# Patient Record
Sex: Female | Born: 1995 | Race: White | Hispanic: No | Marital: Single | Smoking: Never smoker
Health system: Southern US, Community
[De-identification: ages and names within clinical notes are randomized; demographics above are authoritative.]

## PROBLEM LIST (undated history)

## (undated) DIAGNOSIS — E559 Vitamin D deficiency, unspecified: Secondary | ICD-10-CM

## (undated) DIAGNOSIS — T7840XA Allergy, unspecified, initial encounter: Secondary | ICD-10-CM

## (undated) HISTORY — DX: Allergy, unspecified, initial encounter: T78.40XA

## (undated) HISTORY — DX: Vitamin D deficiency, unspecified: E55.9

---
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
## 2013-01-25 ENCOUNTER — Encounter: Payer: Self-pay | Admitting: Internal Medicine

## 2013-01-26 ENCOUNTER — Encounter: Payer: Self-pay | Admitting: Emergency Medicine

## 2013-01-26 ENCOUNTER — Ambulatory Visit: Payer: BC Managed Care – PPO | Admitting: Emergency Medicine

## 2013-01-26 VITALS — BP 118/60 | HR 84 | Temp 98.0°F | Resp 18 | Wt 167.0 lb

## 2013-01-26 DIAGNOSIS — J029 Acute pharyngitis, unspecified: Secondary | ICD-10-CM

## 2013-01-26 DIAGNOSIS — R5381 Other malaise: Secondary | ICD-10-CM

## 2013-01-26 DIAGNOSIS — R221 Localized swelling, mass and lump, neck: Secondary | ICD-10-CM

## 2013-01-26 DIAGNOSIS — J309 Allergic rhinitis, unspecified: Secondary | ICD-10-CM | POA: Insufficient documentation

## 2013-01-26 LAB — CBC WITH DIFFERENTIAL/PLATELET
Basophils Absolute: 0 10*3/uL (ref 0.0–0.1)
Basophils Relative: 1 % (ref 0–1)
Eosinophils Absolute: 0.2 10*3/uL (ref 0.0–0.7)
Eosinophils Relative: 2 % (ref 0–5)
HCT: 40 % (ref 36.0–46.0)
Hemoglobin: 13.7 g/dL (ref 12.0–15.0)
Lymphocytes Relative: 35 % (ref 12–46)
Lymphs Abs: 2.7 10*3/uL (ref 0.7–4.0)
MCH: 29.8 pg (ref 26.0–34.0)
MCHC: 34.3 g/dL (ref 30.0–36.0)
MCV: 87.1 fL (ref 78.0–100.0)
Monocytes Absolute: 0.7 10*3/uL (ref 0.1–1.0)
Monocytes Relative: 10 % (ref 3–12)
Neutro Abs: 4 10*3/uL (ref 1.7–7.7)
Neutrophils Relative %: 52 % (ref 43–77)
Platelets: 262 10*3/uL (ref 150–400)
RBC: 4.59 MIL/uL (ref 3.87–5.11)
RDW: 14.3 % (ref 11.5–15.5)
WBC: 7.6 10*3/uL (ref 4.0–10.5)

## 2013-01-26 LAB — BASIC METABOLIC PANEL WITH GFR
BUN: 13 mg/dL (ref 6–23)
CO2: 27 mEq/L (ref 19–32)
Calcium: 9.6 mg/dL (ref 8.4–10.5)
Chloride: 102 mEq/L (ref 96–112)
Creat: 0.79 mg/dL (ref 0.50–1.10)
GFR, Est African American: >89 mL/min
GFR, Est Non African American: >89 mL/min
Glucose, Bld: 91 mg/dL (ref 70–99)
Potassium: 4.2 mEq/L (ref 3.5–5.3)
Sodium: 138 mEq/L (ref 135–145)

## 2013-01-26 LAB — HEPATIC FUNCTION PANEL
ALT: 14 U/L (ref 0–35)
AST: 20 U/L (ref 0–37)
Albumin: 4.6 g/dL (ref 3.5–5.2)
Alkaline Phosphatase: 45 U/L (ref 39–117)
Bilirubin, Direct: 0.1 mg/dL (ref 0.0–0.3)
Indirect Bilirubin: 0.3 mg/dL (ref 0.0–0.9)
Total Bilirubin: 0.4 mg/dL (ref 0.3–1.2)
Total Protein: 8.1 g/dL (ref 6.0–8.3)

## 2013-01-26 LAB — TSH: TSH: 1.35 u[IU]/mL (ref 0.350–4.500)

## 2013-01-26 MED ORDER — AMOXICILLIN 500 MG PO TABS: 500.0000 mg | ORAL_TABLET | Freq: Three times a day (TID) | ORAL | Status: DC

## 2013-01-26 NOTE — Patient Instructions (Signed)
Infectious Mononucleosis Infectious mononucleosis (mono) is a common germ (viral) infection in children, teenagers, and young adults.  CAUSES  Mono is an infection caused by the Malachi Carl virus. The virus is spread by close personal contact with someone who has the infection. It can be passed by contact with your saliva through things such as kissing or sharing drinking glasses. Sometimes, the infection can be spread from someone who does not appear sick but still spreads the virus (asymptomatic carrier state).  SYMPTOMS  The most common symptoms of Mono are:  Sore throat.  Headache.  Fatigue.  Muscle aches.  Swollen glands.  Fever.  Poor appetite.  Enlarged liver or spleen. The less common symptoms can include:  Rash.  Feeling sick to your stomach (nauseous).  Abdominal pain. DIAGNOSIS  Mono is diagnosed by a blood test.  TREATMENT  Treatment of mono is usually at home. There is no medicine that cures this virus. Sometimes hospital treatment is needed in severe cases. Steroid medicine sometimes is needed if the swelling in the throat causes breathing or swallowing problems.  HOME CARE INSTRUCTIONS   Drink enough fluids to keep your urine clear or pale yellow.  Eat soft foods. Cool foods like popsicles or ice cream can soothe a sore throat.  Only take over-the-counter or prescription medicines for pain, discomfort, or fever as directed by your caregiver. Children under 73 years of age should not take aspirin.  Gargle salt water. This may help relieve your sore throat. Put 1 teaspoon (tsp) of salt in 1 cup of warm water. Sucking on hard candy may also help.  Rest as needed.  Start regular activities gradually after the fever is gone. Be sure to rest when tired.  Avoid strenuous exercise or contact sports until your caregiver says it is okay. The liver and spleen could be seriously injured.  Avoid sharing drinking glasses or kissing until your caregiver tells you  that you are no longer contagious. SEEK MEDICAL CARE IF:   Your fever is not gone after 7 days.  Your activity level is not back to normal after 2 weeks.  You have yellow coloring to eyes and skin (jaundice). SEEK IMMEDIATE MEDICAL CARE IF:   You have severe pain in the abdomen or shoulder.  You have trouble swallowing or drooling.  You have trouble breathing.  You develop a stiff neck.  You develop a severe headache.  You cannot stop throwing up (vomiting).  You have convulsions.  You are confused.  You have trouble with balance.  You develop signs of body fluid loss (dehydration):  Weakness.  Sunken eyes.  Pale skin.  Dry mouth.  Rapid breathing or pulse. MAKE SURE YOU:   Understand these instructions.  Will watch your condition.  Will get help right away if you are not doing well or get worse. Document Released: 02/28/2000 Document Revised: 05/25/2011 Document Reviewed: 12/27/2007 [facility] Patient Information 2014 [place], [place]. Sore Throat A sore throat is a painful, burning, sore, or scratchy feeling of the throat. There may be pain or tenderness when swallowing or talking. You may have other symptoms with a sore throat. These include coughing, sneezing, fever, or a swollen neck. A sore throat is often the first sign of another sickness. These sicknesses may include a cold, flu, strep throat, or an infection called mono. Most sore throats go away without medical treatment.  HOME CARE   Only take medicine as told by your doctor.  Drink enough fluids to keep your pee (urine)  clear or pale yellow.  Rest as needed.  Try using throat sprays, lozenges, or suck on hard candy (if older than 4 years or as told).  Sip warm liquids, such as broth, herbal tea, or warm water with honey. Try sucking on frozen ice pops or drinking cold liquids.  Rinse the mouth (gargle) with salt water. Mix 1 teaspoon salt with 8 ounces of water.  Do not smoke. Avoid being  around others when they are smoking.  Put a humidifier in your bedroom at night to moisten the air. You can also turn on a hot shower and sit in the bathroom for 5 10 minutes. Be sure the bathroom door is closed. GET HELP RIGHT AWAY IF:   You have trouble breathing.  You cannot swallow fluids, soft foods, or your spit (saliva).  You have more puffiness (swelling) in the throat.  Your sore throat does not get better in 7 days.  You feel sick to your stomach (nauseous) and throw up (vomit).  You have a fever or lasting symptoms for more than 2 3 days.  You have a fever and your symptoms suddenly get worse. MAKE SURE YOU:   Understand these instructions.  Will watch your condition.  Will get help right away if you are not doing well or get worse. Document Released: 12/10/2007 Document Revised: 11/25/2011 Document Reviewed: 11/08/2011 [facility] Patient Information 2014 [place], [place].

## 2013-01-27 ENCOUNTER — Telehealth: Payer: Self-pay | Admitting: *Deleted

## 2013-01-27 LAB — EPSTEIN-BARR VIRUS VCA ANTIBODY PANEL
EBV EA IgG: <5 U/mL (ref <9.0)
EBV NA IgG: <3 U/mL (ref <18.0)
EBV VCA IgG: 170 U/mL — ABNORMAL HIGH (ref <18.0)
EBV VCA IgM: <10 U/mL (ref <36.0)

## 2013-01-27 NOTE — Telephone Encounter (Signed)
Message copied by Nicholaus Corolla A on Fri Jan 27, 2013  8:43 AM ------      Message from: [place], [place] R      Created: Fri Jan 27, 2013  6:18 AM       All labs ok except she has recent MONO. So she needs to push fluids no extreme activity or contact sports for 2 weeks. ------

## 2013-01-27 NOTE — Progress Notes (Signed)
  Subjective:    Patient ID: Beth Thomas, female    DOB: 07-31-1995, 17 y.o.   MRN: [id number]  HPI Comments: 17 yo presents for recheck of tonsils was treated 12/22/12 with Zpak/ Prednisone and noticed symptoms with left tonsil improved but right still swollen and tender. + post nasal drainage and fatigue. Denies fever or any other symptoms.     Current Outpatient Prescriptions on File Prior to Visit  Medication Sig Dispense Refill  . Cetirizine HCl (ZYRTEC ALLERGY) 10 MG CAPS Take 10 mg by mouth daily.      . cholecalciferol (VITAMIN D) 1000 UNITS tablet Take 5,000 Units by mouth daily.       No current facility-administered medications on file prior to visit.    Review of patient's allergies indicates no known allergies.  Past Medical History  Diagnosis Date  . Allergy   . Unspecified vitamin D deficiency     Review of Systems  Constitutional: Positive for fatigue.  HENT: Positive for postnasal drip and sore throat.   All other systems reviewed and are negative.       Objective:   Physical Exam  Nursing note and vitals reviewed. Constitutional: She is oriented to person, place, and time. She appears well-developed and well-nourished.  HENT:  Head: Normocephalic and atraumatic.  Right Ear: External ear normal.  Left Ear: External ear normal.  Nose: Nose normal.  Mouth/Throat: Oropharynx is clear and moist.  Right tonsil +2 with mild erythema, left normal   Eyes: Pupils are equal, round, and reactive to light.  Neck: Normal range of motion. Neck supple.  Questionable anterior fullness, no obvious mass or assymetry  Cardiovascular: Normal rate, regular rhythm, normal heart sounds and intact distal pulses.   Pulmonary/Chest: Effort normal and breath sounds normal.  Abdominal: Soft. Bowel sounds are normal. She exhibits no mass. There is no tenderness.  Lymphadenopathy:    She has no cervical adenopathy.  Neurological: She is alert and oriented to person, place, and  time.  Skin: Skin is warm and dry.  Psychiatric: She has a normal mood and affect. Judgment normal.          Assessment & Plan:  Right Tonsil enlargement, Check labs r/o abscess vs mono. Start Amox 500 mg TID #21 with food. Increase fluids, rest, if symptoms increase ER. If neck fullness does not resolve will need U/S pt w/c with results. Patient declines dexamethasone injection/ Rocephin

## 2013-01-31 NOTE — Telephone Encounter (Signed)
Pt aware of labs  

## 2013-02-20 ENCOUNTER — Telehealth: Payer: Self-pay | Admitting: Internal Medicine

## 2013-02-20 ENCOUNTER — Other Ambulatory Visit: Payer: Self-pay | Admitting: Emergency Medicine

## 2013-02-20 DIAGNOSIS — J039 Acute tonsillitis, unspecified: Secondary | ICD-10-CM

## 2013-02-20 NOTE — Telephone Encounter (Signed)
Please advise patient we will call with appointment for ENT once we get her scheduled. Please and Thnaks

## 2013-02-20 NOTE — Telephone Encounter (Signed)
Pt needs referral to ENT, rt tonsils is swollen.  Please advise pt

## 2013-02-28 NOTE — Telephone Encounter (Signed)
Pt went to Dr Narda Bonds, and will have to have there tonsils removed per father

## 2013-09-10 ENCOUNTER — Other Ambulatory Visit: Payer: Self-pay | Admitting: Internal Medicine

## 2014-04-26 ENCOUNTER — Encounter: Payer: Self-pay | Admitting: Physician Assistant

## 2014-04-26 ENCOUNTER — Ambulatory Visit (INDEPENDENT_AMBULATORY_CARE_PROVIDER_SITE_OTHER): Payer: 59 | Admitting: Physician Assistant

## 2014-04-26 VITALS — BP 116/70 | HR 100 | Temp 99.2°F | Resp 18 | Ht 63.0 in | Wt 166.0 lb

## 2014-04-26 DIAGNOSIS — J029 Acute pharyngitis, unspecified: Secondary | ICD-10-CM

## 2014-04-26 LAB — POCT RAPID STREP A (OFFICE): Rapid Strep A Screen: NEGATIVE

## 2014-04-26 MED ORDER — AZITHROMYCIN 250 MG PO TABS: ORAL_TABLET | ORAL | Status: DC

## 2014-04-26 MED ORDER — PREDNISONE 20 MG PO TABS: ORAL_TABLET | ORAL | Status: DC

## 2014-04-26 NOTE — Patient Instructions (Signed)
Please take the prednisone to help decrease inflammation and therefore decrease symptoms. Take it it with food to avoid GI upset. It can cause increased energy but on the other hand it can make it hard to sleep at night so please take it AT NIGHT WITH DINNER, it takes 8-12 hours to start working so it will NOT affect your sleeping if you take it at night with your food!!  If you are diabetic it will increase your sugars so decrease carbs and monitor your sugars closely.     -Make sure you are drinking plenty of fluids to stay hydrated.  -while drinking fluids pinch and hold nose close and swallow, to help open eustachian tubes and drain fluid behind ear drums. -you can do salt water gargles. You can also do 1 TSP liquid Maalox and 1 TSP liquid benadryl- mix/ gargle/ spit  If you are not feeling better in 10-14 days, then please call the office.  Pharyngitis Pharyngitis is redness, pain, and swelling (inflammation) of your pharynx.  CAUSES  Pharyngitis is usually caused by infection. Most of the time, these infections are from viruses (viral) and are part of a cold. However, sometimes pharyngitis is caused by bacteria (bacterial). Pharyngitis can also be caused by allergies. Viral pharyngitis may be spread from person to person by coughing, sneezing, and personal items or utensils (cups, forks, spoons, toothbrushes). Bacterial pharyngitis may be spread from person to person by more intimate contact, such as kissing.  SIGNS AND SYMPTOMS  Symptoms of pharyngitis include:   Sore throat.   Tiredness (fatigue).   Low-grade fever.   Headache.  Joint pain and muscle aches.  Skin rashes.  Swollen lymph nodes.  Plaque-like film on throat or tonsils (often seen with bacterial pharyngitis). DIAGNOSIS  Your health care provider will ask you questions about your illness and your symptoms. Your medical history, along with a physical exam, is often all that is needed to diagnose  pharyngitis. Sometimes, a rapid strep test is done. Other lab tests may also be done, depending on the suspected cause.  TREATMENT  Viral pharyngitis will usually get better in 3-4 days without the use of medicine. Bacterial pharyngitis is treated with medicines that kill germs (antibiotics).  HOME CARE INSTRUCTIONS   Drink enough water and fluids to keep your urine clear or pale yellow.   Only take over-the-counter or prescription medicines as directed by your health care provider:   If you are prescribed antibiotics, make sure you finish them even if you start to feel better.   Do not take aspirin.   Get lots of rest.   Gargle with 8 oz of salt water ( tsp of salt per 1 qt of water) as often as every 1-2 hours to soothe your throat.   Throat lozenges (if you are not at risk for choking) or sprays may be used to soothe your throat. SEEK MEDICAL CARE IF:   You have large, tender lumps in your neck.  You have a rash.  You cough up green, yellow-brown, or bloody spit. SEEK IMMEDIATE MEDICAL CARE IF:   Your neck becomes stiff.  You drool or are unable to swallow liquids.  You vomit or are unable to keep medicines or liquids down.  You have severe pain that does not go away with the use of recommended medicines.  You have trouble breathing (not caused by a stuffy nose). MAKE SURE YOU:   Understand these instructions.  Will watch your condition.  Will get help right   away if you are not doing well or get worse. Document Released: 03/02/2005 Document Revised: 12/21/2012 Document Reviewed: 11/07/2012 [employer] Patient Information 2015 [employer]. This information is not intended to replace advice given to you by your health care provider. Make sure you discuss any questions you have with your health care provider.  

## 2014-04-26 NOTE — Progress Notes (Signed)
   Subjective:    Patient ID: Beth Thomas, female    DOB: June 15, 1995, 19 y.o.   MRN: [id number]  HPI 19 y.o. female complaining of sinus symptoms for 1 week and sore throat for 2 days.Symptoms include low grade fever, nasal congestion, post nasal drip and sore throat.  Has been gradually worsening since that time. Treatment to date: mucinex, zyrtec.Has history of yearly strep throat several times in the past and follows with Dr. Ezzard StandingNewman.   Review of Systems  Constitutional: Positive for chills and fatigue. Negative for fever and diaphoresis.  HENT: Positive for congestion, postnasal drip, rhinorrhea, sinus pressure and sore throat. Negative for drooling, ear pain and sneezing.   Respiratory: Negative.  Negative for cough and shortness of breath.   Cardiovascular: Negative.   Musculoskeletal: Positive for neck pain.  Neurological: Negative.  Negative for headaches.       Objective:   Physical Exam  Constitutional: She appears well-developed and well-nourished.  HENT:  Head: Normocephalic and atraumatic.  Right Ear: External ear normal.  Left Ear: External ear normal.  Mouth/Throat: Uvula is midline and mucous membranes are normal. Oropharyngeal exudate, posterior oropharyngeal edema and posterior oropharyngeal erythema present. No tonsillar abscesses.  Eyes: Conjunctivae and EOM are normal. Pupils are equal, round, and reactive to light.  Neck: Normal range of motion. Neck supple.  Cardiovascular: Normal rate, regular rhythm and normal heart sounds.   Pulmonary/Chest: Effort normal and breath sounds normal.  Abdominal: Soft. Bowel sounds are normal.  Lymphadenopathy:    She has cervical adenopathy (on right).  Skin: Skin is warm and dry.       Assessment & Plan:  Pharyngitis: Strep a negative- take medications as prescribed, increase fluids,  Salt water gargles. If symptoms do not improve in 5-7 days or get worse contact the office or go to ER.

## 2014-05-03 ENCOUNTER — Ambulatory Visit (INDEPENDENT_AMBULATORY_CARE_PROVIDER_SITE_OTHER): Payer: 59 | Admitting: Physician Assistant

## 2014-05-03 ENCOUNTER — Encounter: Payer: Self-pay | Admitting: Physician Assistant

## 2014-05-03 VITALS — BP 118/66 | HR 76 | Temp 98.2°F | Resp 18 | Ht 63.0 in | Wt 166.0 lb

## 2014-05-03 DIAGNOSIS — R112 Nausea with vomiting, unspecified: Secondary | ICD-10-CM

## 2014-05-03 DIAGNOSIS — R5383 Other fatigue: Secondary | ICD-10-CM

## 2014-05-03 DIAGNOSIS — R1030 Lower abdominal pain, unspecified: Secondary | ICD-10-CM

## 2014-05-03 DIAGNOSIS — R5081 Fever presenting with conditions classified elsewhere: Secondary | ICD-10-CM

## 2014-05-03 LAB — BASIC METABOLIC PANEL WITH GFR
BUN: 12 mg/dL (ref 6–23)
CO2: 29 mEq/L (ref 19–32)
Calcium: 9.1 mg/dL (ref 8.4–10.5)
Chloride: 101 mEq/L (ref 96–112)
Creat: 0.77 mg/dL (ref 0.50–1.10)
GFR, Est African American: >89 mL/min
GFR, Est Non African American: >89 mL/min
Glucose, Bld: 74 mg/dL (ref 70–99)
Potassium: 4.1 mEq/L (ref 3.5–5.3)
Sodium: 139 mEq/L (ref 135–145)

## 2014-05-03 LAB — HEPATIC FUNCTION PANEL
ALT: 12 U/L (ref 0–35)
AST: 19 U/L (ref 0–37)
Albumin: 4.1 g/dL (ref 3.5–5.2)
Alkaline Phosphatase: 41 U/L (ref 39–117)
Bilirubin, Direct: 0.1 mg/dL (ref 0.0–0.3)
Indirect Bilirubin: 0.2 mg/dL (ref 0.2–1.2)
Total Bilirubin: 0.3 mg/dL (ref 0.2–1.2)
Total Protein: 7.5 g/dL (ref 6.0–8.3)

## 2014-05-03 MED ORDER — PROMETHAZINE HCL 25 MG PO TABS: 25.0000 mg | ORAL_TABLET | Freq: Four times a day (QID) | ORAL | Status: DC | PRN

## 2014-05-03 MED ORDER — ONDANSETRON HCL 4 MG PO TABS: 4.0000 mg | ORAL_TABLET | Freq: Three times a day (TID) | ORAL | Status: DC | PRN

## 2014-05-03 NOTE — Progress Notes (Signed)
   Subjective:    Patient ID: Beth Thomas, female    DOB: 1995/11/19, 19 y.o.   MRN: [id number]  HPI 19 y.o. female seen for acute pharyngitis on 02/11, given zpak and prednisone (took 6 our of 10), then 4 days later on 02/15 she started to have fever, chills, nausea, vomiting.  She states she did have [place] food Saturday and reheated it Sunday. She has been feeling better since then, last time she vomited was Tuesday night. Since then she has had headache, upset stomach, and achey with fatigue. Not sexually active, denies rashes, diarrhea.    Review of Systems  Constitutional: Positive for fever, chills and fatigue. Negative for diaphoresis, activity change, appetite change and unexpected weight change.  HENT: Positive for sore throat. Negative for congestion, dental problem, drooling, ear discharge, ear pain, facial swelling, hearing loss, mouth sores, nosebleeds, postnasal drip, rhinorrhea, sinus pressure, sneezing, tinnitus, trouble swallowing and voice change.   Respiratory: Negative.   Cardiovascular: Negative.   Gastrointestinal: Positive for nausea, vomiting and abdominal pain. Negative for diarrhea, constipation, blood in stool, abdominal distention, anal bleeding and rectal pain.  Genitourinary: Negative.   Musculoskeletal: Positive for myalgias. Negative for back pain, joint swelling, arthralgias, gait problem, neck pain and neck stiffness.  Skin: Negative.  Negative for rash.  Neurological: Positive for headaches. Negative for dizziness, tremors, seizures, syncope, facial asymmetry, speech difficulty, weakness, light-headedness and numbness.       Objective:   Physical Exam  Constitutional: She is oriented to person, place, and time.  HENT:  Head: Normocephalic and atraumatic.  Right Ear: External ear normal.  Left Ear: External ear normal.  Mouth/Throat: Oropharynx is clear and moist. No oropharyngeal exudate.  + swollen erythematous tonsils without exudate  Neck:  Normal range of motion. Neck supple.  Cardiovascular: Normal rate and regular rhythm.   Pulmonary/Chest: Effort normal and breath sounds normal. No respiratory distress. She has no wheezes.  Abdominal: Soft. Bowel sounds are normal. She exhibits no distension and no mass. There is tenderness (bilateral lower quadrant). There is no rebound and no guarding.  Lymphadenopathy:    She has no cervical adenopathy.  Neurological: She is oriented to person, place, and time.  Skin: Skin is warm and dry. No rash noted.      Assessment & Plan:  Nausea/vomting-benign AB- no change pregnancy- recent ? Strep versus mono with fatigue- will check labs including mono, will give zofran/phenergan for nausea, rest, BRAT diet, and fluids.

## 2014-05-03 NOTE — Patient Instructions (Signed)

## 2014-05-04 LAB — CBC WITH DIFFERENTIAL/PLATELET
Basophils Absolute: 0.1 10*3/uL (ref 0.0–0.1)
Basophils Relative: 1 % (ref 0–1)
Eosinophils Absolute: 0.3 10*3/uL (ref 0.0–0.7)
Eosinophils Relative: 4 % (ref 0–5)
HCT: 38.1 % (ref 36.0–46.0)
Hemoglobin: 12.6 g/dL (ref 12.0–15.0)
Lymphocytes Relative: 43 % (ref 12–46)
Lymphs Abs: 3.1 10*3/uL (ref 0.7–4.0)
MCH: 28.6 pg (ref 26.0–34.0)
MCHC: 33.1 g/dL (ref 30.0–36.0)
MCV: 86.6 fL (ref 78.0–100.0)
MPV: 10.9 fL (ref 8.6–12.4)
Monocytes Absolute: 0.7 10*3/uL (ref 0.1–1.0)
Monocytes Relative: 9 % (ref 3–12)
Neutro Abs: 3.1 10*3/uL (ref 1.7–7.7)
Neutrophils Relative %: 43 % (ref 43–77)
Platelets: 299 10*3/uL (ref 150–400)
RBC: 4.4 MIL/uL (ref 3.87–5.11)
RDW: 14.1 % (ref 11.5–15.5)
WBC: 7.3 10*3/uL (ref 4.0–10.5)

## 2014-05-04 LAB — EPSTEIN-BARR VIRUS VCA ANTIBODY PANEL
EBV EA IgG: <5 U/mL (ref <9.0)
EBV NA IgG: <3 U/mL (ref <18.0)
EBV VCA IgG: 122 U/mL — ABNORMAL HIGH (ref <18.0)
EBV VCA IgM: <10 U/mL (ref <36.0)

## 2014-08-01 ENCOUNTER — Emergency Department (HOSPITAL_COMMUNITY): Payer: 59

## 2014-08-01 ENCOUNTER — Emergency Department (HOSPITAL_COMMUNITY)
Admission: EM | Admit: 2014-08-01 | Discharge: 2014-08-01 | Disposition: A | Discharge status: Home / Self Care | Payer: 59 | Attending: Emergency Medicine | Admitting: Emergency Medicine

## 2014-08-01 ENCOUNTER — Encounter (HOSPITAL_COMMUNITY): Payer: Self-pay | Admitting: Emergency Medicine

## 2014-08-01 DIAGNOSIS — Y929 Unspecified place or not applicable: Secondary | ICD-10-CM | POA: Insufficient documentation

## 2014-08-01 DIAGNOSIS — S01111A Laceration without foreign body of right eyelid and periocular area, initial encounter: Secondary | ICD-10-CM | POA: Insufficient documentation

## 2014-08-01 DIAGNOSIS — Y9389 Activity, other specified: Secondary | ICD-10-CM | POA: Insufficient documentation

## 2014-08-01 DIAGNOSIS — Z8639 Personal history of other endocrine, nutritional and metabolic disease: Secondary | ICD-10-CM | POA: Insufficient documentation

## 2014-08-01 DIAGNOSIS — R55 Syncope and collapse: Secondary | ICD-10-CM | POA: Diagnosis not present

## 2014-08-01 DIAGNOSIS — W1830XA Fall on same level, unspecified, initial encounter: Secondary | ICD-10-CM | POA: Insufficient documentation

## 2014-08-01 DIAGNOSIS — Z79899 Other long term (current) drug therapy: Secondary | ICD-10-CM | POA: Insufficient documentation

## 2014-08-01 DIAGNOSIS — IMO0002 Reserved for concepts with insufficient information to code with codable children: Secondary | ICD-10-CM

## 2014-08-01 DIAGNOSIS — R4020 Unspecified coma: Secondary | ICD-10-CM

## 2014-08-01 DIAGNOSIS — Y999 Unspecified external cause status: Secondary | ICD-10-CM | POA: Diagnosis not present

## 2014-08-01 LAB — BASIC METABOLIC PANEL
Anion gap: 11 (ref 5–15)
BUN: 8 mg/dL (ref 6–20)
CO2: 25 mmol/L (ref 22–32)
Calcium: 9.7 mg/dL (ref 8.9–10.3)
Chloride: 103 mmol/L (ref 101–111)
Creatinine, Ser: 0.85 mg/dL (ref 0.44–1.00)
GFR calc Af Amer: >60 mL/min (ref >60)
GFR calc non Af Amer: >60 mL/min (ref >60)
Glucose, Bld: 113 mg/dL — ABNORMAL HIGH (ref 65–99)
Potassium: 3.6 mmol/L (ref 3.5–5.1)
Sodium: 139 mmol/L (ref 135–145)

## 2014-08-01 LAB — CBC WITH DIFFERENTIAL/PLATELET
Basophils Absolute: 0 10*3/uL (ref 0.0–0.1)
Basophils Relative: 0 % (ref 0–1)
Eosinophils Absolute: 0.1 10*3/uL (ref 0.0–0.7)
Eosinophils Relative: 1 % (ref 0–5)
HCT: 41.1 % (ref 36.0–46.0)
Hemoglobin: 13.1 g/dL (ref 12.0–15.0)
Lymphocytes Relative: 24 % (ref 12–46)
Lymphs Abs: 1.9 10*3/uL (ref 0.7–4.0)
MCH: 27.8 pg (ref 26.0–34.0)
MCHC: 31.9 g/dL (ref 30.0–36.0)
MCV: 87.1 fL (ref 78.0–100.0)
Monocytes Absolute: 0.4 10*3/uL (ref 0.1–1.0)
Monocytes Relative: 6 % (ref 3–12)
Neutro Abs: 5.5 10*3/uL (ref 1.7–7.7)
Neutrophils Relative %: 69 % (ref 43–77)
Platelets: 272 10*3/uL (ref 150–400)
RBC: 4.72 MIL/uL (ref 3.87–5.11)
RDW: 14.1 % (ref 11.5–15.5)
WBC: 7.9 10*3/uL (ref 4.0–10.5)

## 2014-08-01 LAB — I-STAT TROPONIN, ED: Troponin i, poc: 0 ng/mL (ref 0.00–0.08)

## 2014-08-01 LAB — CBG MONITORING, ED: Glucose-Capillary: 119 mg/dL — ABNORMAL HIGH (ref 65–99)

## 2014-08-01 LAB — I-STAT BETA HCG BLOOD, ED (MC, WL, AP ONLY): I-stat hCG, quantitative: <5 m[IU]/mL (ref <5)

## 2014-08-01 MED ORDER — BUPIVACAINE HCL 0.25 % IJ SOLN
5.0000 mL | Freq: Once | INTRAMUSCULAR | Status: DC
  Filled 2014-08-01: qty 5

## 2014-08-01 MED ORDER — BUPIVACAINE HCL (PF) 0.25 % IJ SOLN
10.0000 mL | Freq: Once | INTRAMUSCULAR | Status: AC
Start: 2014-08-01 — End: 2014-08-01
  Administered 2014-08-01: 10 mL
  Filled 2014-08-01: qty 10

## 2014-08-01 MED ORDER — IBUPROFEN 800 MG PO TABS
800.0000 mg | ORAL_TABLET | Freq: Once | ORAL | Status: AC
  Administered 2014-08-01: 800 mg via ORAL
  Filled 2014-08-01: qty 1

## 2014-08-01 NOTE — ED Notes (Signed)
CBG is 119. Notified nurse Cicero DuckErika.

## 2014-08-01 NOTE — Discharge Instructions (Signed)
Head Injury Have sutures removed in 5-7 days.  Keep wound clean and dry. You have received a head injury. It does not appear serious at this time. Headaches and vomiting are common following head injury. It should be easy to awaken from sleeping. Sometimes it is necessary for you to stay in the emergency department for a while for observation. Sometimes admission to the hospital may be needed. After injuries such as yours, most problems occur within the first 24 hours, but side effects may occur up to 7-10 days after the injury. It is important for you to carefully monitor your condition and contact your health care provider or seek immediate medical care if there is a change in your condition. WHAT ARE THE TYPES OF HEAD INJURIES? Head injuries can be as minor as a bump. Some head injuries can be more severe. More severe head injuries include:  A jarring injury to the brain (concussion).  A bruise of the brain (contusion). This mean there is bleeding in the brain that can cause swelling.  A cracked skull (skull fracture).  Bleeding in the brain that collects, clots, and forms a bump (hematoma). WHAT CAUSES A HEAD INJURY? A serious head injury is most likely to happen to someone who is in a car wreck and is not wearing a seat belt. Other causes of major head injuries include bicycle or motorcycle accidents, sports injuries, and falls. HOW ARE HEAD INJURIES DIAGNOSED? A complete history of the event leading to the injury and your current symptoms will be helpful in diagnosing head injuries. Many times, pictures of the brain, such as CT or MRI are needed to see the extent of the injury. Often, an overnight hospital stay is necessary for observation.  WHEN SHOULD I SEEK IMMEDIATE MEDICAL CARE?  You should get help right away if:  You have confusion or drowsiness.  You feel sick to your stomach (nauseous) or have continued, forceful vomiting.  You have dizziness or unsteadiness that is getting  worse.  You have severe, continued headaches not relieved by medicine. Only take over-the-counter or prescription medicines for pain, fever, or discomfort as directed by your health care provider.  You do not have normal function of the arms or legs or are unable to walk.  You notice changes in the black spots in the center of the colored part of your eye (pupil).  You have a clear or bloody fluid coming from your nose or ears.  You have a loss of vision. During the next 24 hours after the injury, you must stay with someone who can watch you for the warning signs. This person should contact local emergency services (911 in the [place] if you have seizures, you become unconscious, or you are unable to wake up. HOW CAN I PREVENT A HEAD INJURY IN THE FUTURE? The most important factor for preventing major head injuries is avoiding motor vehicle accidents. To minimize the potential for damage to your head, it is crucial to wear seat belts while riding in motor vehicles. Wearing helmets while bike riding and playing collision sports (like football) is also helpful. Also, avoiding dangerous activities around the house will further help reduce your risk of head injury.  WHEN CAN I RETURN TO NORMAL ACTIVITIES AND ATHLETICS? You should be reevaluated by your health care provider before returning to these activities. If you have any of the following symptoms, you should not return to activities or contact sports until 1 week after the symptoms have stopped:  Persistent headache.  Dizziness or vertigo.  Poor attention and concentration.  Confusion.  Memory problems.  Nausea or vomiting.  Fatigue or tire easily.  Irritability.  Intolerant of bright lights or loud noises.  Anxiety or depression.  Disturbed sleep. MAKE SURE YOU:   Understand these instructions.  Will watch your condition.  Will get help right away if you are not doing well or get worse. Document Released: 03/02/2005  Document Revised: 03/07/2013 Document Reviewed: 11/07/2012 [facility] Patient Information 2015 [place], [place]. This information is not intended to replace advice given to you by your health care provider. Make sure you discuss any questions you have with your health care provider.  Laceration Care, Adult A laceration is a cut or lesion that goes through all layers of the skin and into the tissue just beneath the skin. TREATMENT  Some lacerations may not require closure. Some lacerations may not be able to be closed due to an increased risk of infection. It is important to see your caregiver as soon as possible after an injury to minimize the risk of infection and maximize the opportunity for successful closure. If closure is appropriate, pain medicines may be given, if needed. The wound will be cleaned to help prevent infection. Your caregiver will use stitches (sutures), staples, wound glue (adhesive), or skin adhesive strips to repair the laceration. These tools bring the skin edges together to allow for faster healing and a better cosmetic outcome. However, all wounds will heal with a scar. Once the wound has healed, scarring can be minimized by covering the wound with sunscreen during the day for 1 full year. HOME CARE INSTRUCTIONS  For sutures or staples:  Keep the wound clean and dry.  If you were given a bandage (dressing), you should change it at least once a day. Also, change the dressing if it becomes wet or dirty, or as directed by your caregiver.  Wash the wound with soap and water 2 times a day. Rinse the wound off with water to remove all soap. Pat the wound dry with a clean towel.  After cleaning, apply a thin layer of the antibiotic ointment as recommended by your caregiver. This will help prevent infection and keep the dressing from sticking.  You may shower as usual after the first 24 hours. Do not soak the wound in water until the sutures are removed.  Only take  over-the-counter or prescription medicines for pain, discomfort, or fever as directed by your caregiver.  Get your sutures or staples removed as directed by your caregiver. For skin adhesive strips:  Keep the wound clean and dry.  Do not get the skin adhesive strips wet. You may bathe carefully, using caution to keep the wound dry.  If the wound gets wet, pat it dry with a clean towel.  Skin adhesive strips will fall off on their own. You may trim the strips as the wound heals. Do not remove skin adhesive strips that are still stuck to the wound. They will fall off in time. For wound adhesive:  You may briefly wet your wound in the shower or bath. Do not soak or scrub the wound. Do not swim. Avoid periods of heavy perspiration until the skin adhesive has fallen off on its own. After showering or bathing, gently pat the wound dry with a clean towel.  Do not apply liquid medicine, cream medicine, or ointment medicine to your wound while the skin adhesive is in place. This may loosen the film before your wound is healed.  If a dressing is placed over the wound, be careful not to apply tape directly over the skin adhesive. This may cause the adhesive to be pulled off before the wound is healed.  Avoid prolonged exposure to sunlight or tanning lamps while the skin adhesive is in place. Exposure to ultraviolet light in the first year will darken the scar.  The skin adhesive will usually remain in place for 5 to 10 days, then naturally fall off the skin. Do not pick at the adhesive film. You may need a tetanus shot if:  You cannot remember when you had your last tetanus shot.  You have never had a tetanus shot. If you get a tetanus shot, your arm may swell, get red, and feel warm to the touch. This is common and not a problem. If you need a tetanus shot and you choose not to have one, there is a rare chance of getting tetanus. Sickness from tetanus can be serious. SEEK MEDICAL CARE IF:   You  have redness, swelling, or increasing pain in the wound.  You see a red line that goes away from the wound.  You have yellowish-white fluid (pus) coming from the wound.  You have a fever.  You notice a bad smell coming from the wound or dressing.  Your wound breaks open before or after sutures have been removed.  You notice something coming out of the wound such as wood or glass.  Your wound is on your hand or foot and you cannot move a finger or toe. SEEK IMMEDIATE MEDICAL CARE IF:   Your pain is not controlled with prescribed medicine.  You have severe swelling around the wound causing pain and numbness or a change in color in your arm, hand, leg, or foot.  Your wound splits open and starts bleeding.  You have worsening numbness, weakness, or loss of function of any joint around or beyond the wound.  You develop painful lumps near the wound or on the skin anywhere on your body. MAKE SURE YOU:   Understand these instructions.  Will watch your condition.  Will get help right away if you are not doing well or get worse. Document Released: 03/02/2005 Document Revised: 05/25/2011 Document Reviewed: 08/26/2010 [facility] Patient Information 2015 [place], [place]. This information is not intended to replace advice given to you by your health care provider. Make sure you discuss any questions you have with your health care provider.

## 2014-08-01 NOTE — ED Provider Notes (Signed)
CSN: [id number]     Arrival date & time 08/01/14  1506 History   First MD Initiated Contact with Patient 08/01/14 1554     Chief Complaint  Patient presents with  . Loss of Consciousness     (Consider location/radiation/quality/duration/timing/severity/associated sxs/prior Treatment) The history is provided by the patient and a parent. No language interpreter was used.   Ms. Beth Thomas is a 19 y.o female who presents for loss of consciousness 1 hour prior to arrival.  She states she felt fine when she woke up this morning but did not eat anything all day.  She went to make herself spaghetti when she felt dizzy for a few minutes before blacking out and falling.  She fell and hit her head on the ground. Her father says she was out for a minute or two. He did not see any convulsions.   She was able to regain consciousness and woke up again and was able to ambulate.  LMP was 1 week ago.  This has never happened to her in the past. She denies any recent illness, blurry vision, chest pain, shortness of breath, nausea, vomiting, abdominal pain, or urinary symptoms.   Past Medical History  Diagnosis Date  . Allergy   . Unspecified vitamin D deficiency    History reviewed. No pertinent past surgical history. Family History  Problem Relation Age of Onset  . Hyperlipidemia Mother   . Hypertension Mother   . Diabetes Mother   . Hypertension Father   . Hyperlipidemia Father    History  Substance Use Topics  . Smoking status: Passive Smoke Exposure - Never Smoker  . Smokeless tobacco: Not on file  . Alcohol Use: No   OB History    No data available     Review of Systems  Eyes: Negative for visual disturbance.  Respiratory: Negative for cough.   Genitourinary: Negative for dysuria, frequency and hematuria.  Musculoskeletal: Negative for back pain and neck pain.  Skin: Positive for wound.  Neurological: Positive for syncope and headaches. Negative for weakness.  All other systems reviewed and  are negative.     Allergies  Review of patient's allergies indicates no known allergies.  Home Medications   Prior to Admission medications   Medication Sig Start Date End Date Taking? Authorizing Provider  Cetirizine HCl (ZYRTEC ALLERGY) 10 MG CAPS Take 10 mg by mouth daily.   Yes Historical Provider, MD  polyethylene glycol powder (GLYCOLAX/MIRALAX) powder TAKE 17 GRAMS DAILY 09/10/13  Yes Melissa Smith, PA-C  ondansetron (ZOFRAN) 4 MG tablet Take 1 tablet (4 mg total) by mouth every 8 (eight) hours as needed for nausea or vomiting. Patient not taking: Reported on 08/01/2014 05/03/14   Quentin MullingAmanda Collier, PA-C  promethazine (PHENERGAN) 25 MG tablet Take 1 tablet (25 mg total) by mouth every 6 (six) hours as needed for nausea or vomiting (sleeping). Patient not taking: Reported on 08/01/2014 05/03/14   Quentin MullingAmanda Collier, PA-C   BP 106/80 mmHg  Pulse 83  Temp(Src) 98.2 F (36.8 C) (Oral)  Resp 18  SpO2 96%  LMP 07/25/2014 Physical Exam  Constitutional: She is oriented to person, place, and time. She appears well-developed and well-nourished.  HENT:  Head: Normocephalic and atraumatic.  Eyes: Conjunctivae are normal.  Neck: Normal range of motion. Neck supple.  Cardiovascular: Normal rate, regular rhythm and normal heart sounds.   Pulmonary/Chest: Effort normal and breath sounds normal.  Abdominal: Soft. There is no tenderness.  Musculoskeletal: Normal range of motion.  Neurological: She is  alert and oriented to person, place, and time. She has normal strength. No sensory deficit. GCS eye subscore is 4. GCS verbal subscore is 5. GCS motor subscore is 6.  Cranial nerves III-XII intact.   Skin: Skin is warm and dry.  2cm linear laceration above the lateral right eyebrow.   Nursing note and vitals reviewed.   ED Course  Procedures (including critical care time) Labs Review Labs Reviewed  BASIC METABOLIC PANEL - Abnormal; Notable for the following:    Glucose, Bld 113 (*)    All other  components within normal limits  CBG MONITORING, ED - Abnormal; Notable for the following:    Glucose-Capillary 119 (*)    All other components within normal limits  CBC WITH DIFFERENTIAL/PLATELET  I-STAT TROPOININ, ED  I-STAT BETA HCG BLOOD, ED ([facility], [facility], [facility] ONLY)  LACERATION REPAIR Performed by: Catha Gosselin Authorized by: Catha Gosselin Consent: Verbal consent obtained. Risks and benefits: risks, benefits and alternatives were discussed Consent given by: patient Patient identity confirmed: provided demographic data Prepped and Draped in normal sterile fashion Wound explored Laceration Location: Right eyebrow Laceration Length: 2cm No Foreign Bodies seen or palpated Anesthesia: local infiltration Local anesthetic: marcaine .25% without epinephrine Anesthetic total: 2 ml Irrigation method: syringe Amount of cleaning: standard Skin closure: 5.0 Prolene Number of sutures: 4 Technique: Single interupted Patient tolerance: Patient tolerated the procedure well with no immediate complications.   Imaging Review Dg Chest 2 View  08/01/2014   CLINICAL DATA:  Loss of consciousness today, fell striking head, headache, vomiting prior to loss of consciousness and again at hospital  EXAM: CHEST  2 VIEW  COMPARISON:  None  FINDINGS: Normal heart size, mediastinal contours, and pulmonary vascularity.  Lungs clear.  No pleural effusion or pneumothorax.  Broad based biconvex thoracolumbar scoliosis.  IMPRESSION: No acute abnormalities.   Electronically Signed   By: Ulyses Southward M.D.   On: 08/01/2014 16:08     EKG Interpretation   Date/Time:  Wednesday Aug 01 2014 15:14:18 EDT Ventricular Rate:  87 PR Interval:  130 QRS Duration: 90 QT Interval:  360 QTC Calculation: 433 R Axis:   74 Text Interpretation:  Normal sinus rhythm RSR' or QR pattern in V1  suggests right ventricular conduction delay Borderline ECG No old tracing  to compare Confirmed by MILLER  MD, BRIAN ([id number]) on  08/01/2014 4:57:17 PM      MDM   Final diagnoses:  Loss of consciousness  Laceration   Patient presents for a couple minutes of dizziness before making herself something to eat.  She went to get the pot to cook spaghetti when she says she blacked out and fell.  Father states he heard her hit her head on the ground and was unconscious for 1-2 minutes.  He did not notice any seizure activity.  She was able to get up and walk after the incident and is complaining of a headache now.  Her labs are normal. She was back to baseline a couple of minutes after the incident.  She has no focal neurological deficit.  I do not think she needs imaging. Her EKG is not concerning.  She is eating and drinking in the ED. I sutured her laceration without difficulty. I think this is related to her not eating today. She can follow up with her pcp to have her sutures removed in 5-7 days and patient and parents agree with the plan.      Catha Gosselin, PA-C 08/01/14 1718  Catha Gosselin,  PA-C 08/01/14 1728  Eber HongBrian Miller, MD 08/02/14 1026

## 2014-08-01 NOTE — ED Notes (Signed)
Patient transported to X-ray 

## 2014-08-01 NOTE — ED Provider Notes (Signed)
19 year old female, she presents from home after having a syncopal episode. She has had nothing to eat today, at 2:00 she was trying to make some spaghetti and became lightheaded when she bent over to grab a pot, syncopized and fell striking her right forehead on the ground, had a loss of consciousness for approximately 2 minutes and then regained spontaneous consciousness, she had no seizures, no urinary incontinence and at this time complains only of a headache on the right and feeling generally weak. On exam she has a laceration to the right eyebrow laterally, this is parallel to the brow just above it, she has a normal pupillary exam, cranial nerves III through XII are normal, no hemotympanum, no malocclusion, no battle sign. She has normal strength and sensation in all 4 extremities, normal cardiac and pulmonary exams and a soft nontender abdomen. Her EKG is normal, there is no signs of arrhythmia or Wolff-Parkinson-White, CBG was 120, CXR and labs pending. The patient is otherwise well-appearing, anticipate discharge home. Tolerated oral food and fluid while in the emergency department.  Medical screening examination/treatment/procedure(s) were conducted as a shared visit with non-physician practitioner(s) and myself.  I personally evaluated the patient during the encounter.  Procedures supervised: Laceration repair.   EKG Interpretation  Date/Time:  Wednesday Aug 01 2014 15:14:18 EDT Ventricular Rate:  87 PR Interval:  130 QRS Duration: 90 QT Interval:  360 QTC Calculation: 433 R Axis:   74 Text Interpretation:  Normal sinus rhythm RSR' or QR pattern in V1 suggests right ventricular conduction delay Borderline ECG No old tracing to compare Confirmed by Izael Bessinger  MD, Yerlin Gasparyan ([id number]) on 08/01/2014 4:57:17 PM      Clinical Impression:   Final diagnoses:  Loss of consciousness  Laceration         Eber HongBrian Kensey Luepke, MD 08/02/14 1026

## 2014-08-01 NOTE — ED Notes (Signed)
I gave the patient a cup of ice, a sprite, 2 packs of crackers and 2 packs of graham crackers per Dr. Hyacinth MeekerMiller.

## 2014-08-01 NOTE — ED Notes (Signed)
Pt sts syncope today with fall; pt with laceration to right side of head; pt alert and appropriate at present; pt sts dizziness prior to episode

## 2014-08-06 ENCOUNTER — Encounter: Payer: Self-pay | Admitting: Physician Assistant

## 2014-08-06 ENCOUNTER — Ambulatory Visit (INDEPENDENT_AMBULATORY_CARE_PROVIDER_SITE_OTHER): Payer: 59 | Admitting: Physician Assistant

## 2014-08-06 VITALS — BP 112/68 | HR 84 | Temp 97.7°F | Resp 16 | Ht 63.0 in | Wt 150.0 lb

## 2014-08-06 DIAGNOSIS — T671XXD Heat syncope, subsequent encounter: Secondary | ICD-10-CM

## 2014-08-06 DIAGNOSIS — Z4802 Encounter for removal of sutures: Secondary | ICD-10-CM

## 2014-08-06 DIAGNOSIS — J351 Hypertrophy of tonsils: Secondary | ICD-10-CM

## 2014-08-06 NOTE — Patient Instructions (Signed)

## 2014-08-06 NOTE — Progress Notes (Signed)
Assessment and Plan: Syncope likely due to hypoglycemia/vasovagal- monitor, labs normal Suture removal- removed 4 suture, healing well, no signs of infection, tolerated well Enlarged tonsils- would benefit from removal- planning on surgery.   HPI 19 y.o.female presents for follow up from the hospital. Patient went to the ER for syncopal episode on  08/01/14. She had a normal EKG, normal labs and was sent home.   She states that had not eaten for 12-15 hours, took a shower and felt some nausea, had just made spaghetti when she felt the room spinning and felt nausea and fell on the way to the bathroom. Denies urinary incontinence, confusion, CP, SOB. Has never had anything like this before. Had 4 sutures placed on 08/01/2014.   Has enlarged tonsils, has dyspagia, trouble at times with eating solid foods due to enlarged tonsils and has had repeated infections.   Images while in the hospital: Dg Chest 2 View  08/01/2014   CLINICAL DATA:  Loss of consciousness today, fell striking head, headache, vomiting prior to loss of consciousness and again at hospital  EXAM: CHEST  2 VIEW  COMPARISON:  None  FINDINGS: Normal heart size, mediastinal contours, and pulmonary vascularity.  Lungs clear.  No pleural effusion or pneumothorax.  Broad based biconvex thoracolumbar scoliosis.  IMPRESSION: No acute abnormalities.   Electronically Signed   By: Ulyses SouthwardMark  Boles M.D.   On: 08/01/2014 16:08    Past Medical History  Diagnosis Date  . Allergy   . Unspecified vitamin D deficiency      No Known Allergies    Current Outpatient Prescriptions on File Prior to Visit  Medication Sig Dispense Refill  . Cetirizine HCl (ZYRTEC ALLERGY) 10 MG CAPS Take 10 mg by mouth daily.    . polyethylene glycol powder (GLYCOLAX/MIRALAX) powder TAKE 17 GRAMS DAILY 527 g 3   No current facility-administered medications on file prior to visit.    ROS: all negative except above.   Physical Exam: Filed Weights   08/06/14 1638   Weight: 150 lb (68.04 kg)   BP 112/68 mmHg  Pulse 84  Temp(Src) 97.7 F (36.5 C)  Resp 16  Ht 5\' 3"  (1.6 m)  Wt 150 lb (68.04 kg)  BMI 26.58 kg/m2  LMP 07/25/2014 General Appearance: Well nourished, in no apparent distress. Eyes: PERRLA, EOMs, conjunctiva no swelling or erythema Sinuses: No Frontal/maxillary tenderness ENT/Mouth: Ext aud canals clear, TMs without erythema, bulging. No erythema, swelling, or exudate on post pharynx.  Tonsils are large without erythema. Hearing normal.  Neck: Supple, thyroid normal.  Respiratory: Respiratory effort normal, BS equal bilaterally without rales, rhonchi, wheezing or stridor.  Cardio: RRR with no MRGs. Brisk peripheral pulses without edema.  Abdomen: Soft, + BS.  Non tender, no guarding, rebound, hernias, masses. Lymphatics: Non tender without lymphadenopathy.  Musculoskeletal: Full ROM, 5/5 strength, normal gait.  Skin: Well healing laceration right lateral eyebrow, mild healing ecchymosis right eye. Warm, dry without rashes, lesions.  Neuro: Cranial nerves intact. Normal muscle tone, no cerebellar symptoms. Sensation intact.  Psych: Awake and oriented X 3, normal affect, Insight and Judgment appropriate.     Quentin Mullingollier, Kia Stavros, PA-C 4:44 PM [facility]

## 2014-09-20 ENCOUNTER — Ambulatory Visit (INDEPENDENT_AMBULATORY_CARE_PROVIDER_SITE_OTHER): Payer: 59 | Admitting: Internal Medicine

## 2014-09-20 ENCOUNTER — Encounter: Payer: Self-pay | Admitting: Physician Assistant

## 2014-09-20 DIAGNOSIS — Z30011 Encounter for initial prescription of contraceptive pills: Secondary | ICD-10-CM

## 2014-09-20 NOTE — Patient Instructions (Signed)
Ethinyl Estradiol; Norethindrone Acetate tablets (contraception) What is this medicine? ETHINYL ESTRADIOL; NORETHINDRONE ACETATE (ETH in il es tra DYE ole; nor eth IN drone AS e tate) is an oral contraceptive. The products combine two types of female hormones, an estrogen and a progestin. They are used to prevent ovulation and pregnancy. This medicine may be used for other purposes; ask your health care provider or pharmacist if you have questions. COMMON BRAND NAME(S): Gildess, Junel 1.5/30, Junel 1/20, LARIN, Loestrin 1.5/30, Loestrin 1/20, Microgestin 1.5/30, Microgestin 1/20 What should I tell my health care provider before I take this medicine? They need to know if you have or ever had any of these conditions: -abnormal vaginal bleeding -blood vessel disease or blood clots -breast, cervical, endometrial, ovarian, liver, or uterine cancer -diabetes -gallbladder disease -heart disease or recent heart attack -high blood pressure -high cholesterol -kidney disease -liver disease -migraine headaches -stroke -systemic lupus erythematosus (SLE) -tobacco smoker -an unusual or allergic reaction to estrogens, progestins, other medicines, foods, dyes, or preservatives -pregnant or trying to get pregnant -breast-feeding How should I use this medicine? Take this medicine by mouth. To reduce nausea, this medicine may be taken with food. Follow the directions on the prescription label. Take this medicine at the same time each day and in the order directed on the package. Do not take your medicine more often than directed. Contact your pediatrician regarding the use of this medicine in children. Special care may be needed. This medicine has been used in female children who have started having menstrual periods. A patient package insert for the product will be given with each prescription and refill. Read this sheet carefully each time. The sheet may change frequently. Overdosage: If you think you  have taken too much of this medicine contact a poison control center or emergency room at once. NOTE: This medicine is only for you. Do not share this medicine with others. What if I miss a dose? If you miss a dose, refer to the patient information sheet you received with your medicine for direction. If you miss more than one pill, this medicine may not be as effective and you may need to use another form of birth control. What may interact with this medicine? -acetaminophen -antibiotics or medicines for infections, especially rifampin, rifabutin, rifapentine, and griseofulvin, and possibly penicillins or tetracyclines -aprepitant -ascorbic acid (vitamin C) -atorvastatin -barbiturate medicines, such as phenobarbital -bosentan -carbamazepine -caffeine -clofibrate -cyclosporine -dantrolene -doxercalciferol -felbamate -grapefruit juice -hydrocortisone -medicines for anxiety or sleeping problems, such as diazepam or temazepam -medicines for diabetes, including pioglitazone -mineral oil -modafinil -mycophenolate -nefazodone -oxcarbazepine -phenytoin -prednisolone -ritonavir or other medicines for HIV infection or AIDS -rosuvastatin -selegiline -soy isoflavones supplements -St. John's wort -tamoxifen or raloxifene -theophylline -thyroid hormones -topiramate -warfarin This list may not describe all possible interactions. Give your health care provider a list of all the medicines, herbs, non-prescription drugs, or dietary supplements you use. Also tell them if you smoke, drink alcohol, or use illegal drugs. Some items may interact with your medicine. What should I watch for while using this medicine? Visit your doctor or health care professional for regular checks on your progress. You will need a regular breast and pelvic exam and Pap smear while on this medicine. Use an additional method of contraception during the first cycle that you take these tablets. If you have any reason  to think you are pregnant, stop taking this medicine right away and contact your doctor or health care professional. If you are taking this   medicine for hormone related problems, it may take several cycles of use to see improvement in your condition. Smoking increases the risk of getting a blood clot or having a stroke while you are taking birth control pills, especially if you are more than 21 years old. You are strongly advised not to smoke. This medicine can make your body retain fluid, making your fingers, hands, or ankles swell. Your blood pressure can go up. Contact your doctor or health care professional if you feel you are retaining fluid. This medicine can make you more sensitive to the sun. Keep out of the sun. If you cannot avoid being in the sun, wear protective clothing and use sunscreen. Do not use sun lamps or tanning beds/booths. If you wear contact lenses and notice visual changes, or if the lenses begin to feel uncomfortable, consult your eye care specialist. In some women, tenderness, swelling, or minor bleeding of the gums may occur. Notify your dentist if this happens. Brushing and flossing your teeth regularly may help limit this. See your dentist regularly and inform your dentist of the medicines you are taking. If you are going to have elective surgery, you may need to stop taking this medicine before the surgery. Consult your health care professional for advice. This medicine does not protect you against HIV infection (AIDS) or any other sexually transmitted diseases. What side effects may I notice from receiving this medicine? Side effects that you should report to your doctor or health care professional as soon as possible: -breast tissue changes or discharge -changes in vaginal bleeding during your period or between your periods -chest pain -coughing up blood -dizziness or fainting spells -headaches or migraines -leg, arm or groin pain -severe or sudden  headaches -stomach pain (severe) -sudden shortness of breath -sudden loss of coordination, especially on one side of the body -speech problems -symptoms of vaginal infection like itching, irritation or unusual discharge -tenderness in the upper abdomen -vomiting -weakness or numbness in the arms or legs, especially on one side of the body -yellowing of the eyes or skin Side effects that usually do not require medical attention (report to your doctor or health care professional if they continue or are bothersome): -breakthrough bleeding and spotting that continues beyond the 3 initial cycles of pills -breast tenderness -mood changes, anxiety, depression, frustration, anger, or emotional outbursts -increased sensitivity to sun or ultraviolet light -nausea -skin rash, acne, or brown spots on the skin -weight gain (slight) This list may not describe all possible side effects. Call your doctor for medical advice about side effects. You may report side effects to FDA at 1-800-FDA-1088. Where should I keep my medicine? Keep out of the reach of children. Store at room temperature between 15 and 30 degrees C (59 and 86 degrees F). Throw away any unused medicine after the expiration date. NOTE: This sheet is a summary. It may not cover all possible information. If you have questions about this medicine, talk to your doctor, pharmacist, or health care provider.  2015, [employer] Standard. (2012-07-08 15:35:20)  

## 2014-09-20 NOTE — Progress Notes (Signed)
   Subjective:    Patient ID: Beth Thomas, female    DOB: 02-24-1996, 19 y.o.   MRN: [id number]  HPI  Patient presents to the office for discussion of birth control.  She recently became sexually active.  She reports that she would like to prevent pregnancy.  She reports that she has never been on any birth control in the past.  She does have normal periods.  She reports that the last period was on the first of last month.  She reports that they have been using condoms, but did have an encounter without a condom.  She does report that her last intercourse was last Thursday.    Review of Systems  Constitutional: Negative for fever, chills and fatigue.  Respiratory: Negative for chest tightness and shortness of breath.   Cardiovascular: Negative for chest pain and palpitations.  Genitourinary: Negative for dysuria, urgency, frequency, hematuria, vaginal bleeding, vaginal discharge and vaginal pain.       Objective:   Physical Exam  Constitutional: She is oriented to person, place, and time. She appears well-developed and well-nourished. No distress.  HENT:  Head: Normocephalic and atraumatic.  Mouth/Throat: Oropharynx is clear and moist. No oropharyngeal exudate.  Eyes: Conjunctivae are normal. No scleral icterus.  Neck: Normal range of motion. Neck supple. No JVD present. No thyromegaly present.  Cardiovascular: Normal rate, regular rhythm, normal heart sounds and intact distal pulses.   Pulmonary/Chest: Effort normal and breath sounds normal. No respiratory distress. She has no wheezes. She has no rales. She exhibits no tenderness.  Abdominal: Soft. Bowel sounds are normal. She exhibits no distension and no mass. There is no tenderness. There is no rebound and no guarding.  Musculoskeletal: Normal range of motion.  Lymphadenopathy:    She has no cervical adenopathy.  Neurological: She is alert and oriented to person, place, and time.  Skin: Skin is warm and dry. She is not  diaphoretic.  Psychiatric: She has a normal mood and affect. Her behavior is normal. Judgment and thought content normal.  Nursing note and vitals reviewed.         Assessment & Plan:    1. Encounter for initial prescription of contraceptive pills -loestrin -period currently 1 week late.  Will check  Pregnancy status -recheck in 1-2 months to determine whether she likes this type of birth control -recommended continued use of condoms for STI protection -Pelvic with pap recommended either this year or next year. - HCG, Quant

## 2014-09-21 LAB — HCG, QUANTITATIVE, PREGNANCY: hCG, Beta Chain, Quant, S: <2 m[IU]/mL

## 2014-10-10 ENCOUNTER — Encounter: Payer: Self-pay | Admitting: Physician Assistant

## 2014-10-10 ENCOUNTER — Ambulatory Visit (INDEPENDENT_AMBULATORY_CARE_PROVIDER_SITE_OTHER): Payer: 59 | Admitting: Physician Assistant

## 2014-10-10 VITALS — BP 122/80 | HR 76 | Temp 98.4°F | Resp 18 | Ht 63.0 in

## 2014-10-10 DIAGNOSIS — Z3049 Encounter for surveillance of other contraceptives: Secondary | ICD-10-CM

## 2014-10-10 DIAGNOSIS — Z23 Encounter for immunization: Secondary | ICD-10-CM

## 2014-10-10 MED ORDER — DROSPIRENONE-ETHINYL ESTRADIOL 3-0.03 MG PO TABS: 1.0000 | ORAL_TABLET | Freq: Every day | ORAL | Status: DC

## 2014-10-10 NOTE — Patient Instructions (Signed)
HPV Vaccine Gardasil (Human Papillomavirus): What You Need to Know 1. What is HPV? Genital human papillomavirus (HPV) is the most common sexually transmitted virus in the [place]. More than half of sexually active men and women are infected with HPV at some time in their lives. About 20 million Americans are currently infected, and about 6 million more get infected each year. HPV is usually spread through sexual contact. Most HPV infections don't cause any symptoms, and go away on their own. But HPV can cause cervical cancer in women. Cervical cancer is the 2nd leading cause of cancer deaths among women around the world. In the [place], about 12,000 women get cervical cancer every year and about 4,000 are expected to die from it. HPV is also associated with several less common cancers, such as vaginal and vulvar cancers in women, and anal and oropharyngeal (back of the throat, including base of tongue and tonsils) cancers in both men and women. HPV can also cause genital warts and warts in the throat. There is no cure for HPV infection, but some of the problems it causes can be treated. 2. HPV vaccine: Why get vaccinated? The HPV vaccine you are getting is one of two vaccines that can be given to prevent HPV. It may be given to both males and females.  This vaccine can prevent most cases of cervical cancer in females, if it is given before exposure to the virus. In addition, it can prevent vaginal and vulvar cancer in females, and genital warts and anal cancer in both males and females. Protection from HPV vaccine is expected to be long-lasting. But vaccination is not a substitute for cervical cancer screening. Women should still get regular Pap tests. 3. Who should get this HPV vaccine and when? HPV vaccine is given as a 3-dose series  1st Dose: Now  2nd Dose: 1 to 2 months after Dose 1  3rd Dose: 6 months after Dose 1 Additional (booster) doses are not recommended. Routine  vaccination  This HPV vaccine is recommended for girls and boys 11 or 21 years of age. It may be given starting at age 9. Why is HPV vaccine recommended at 11 or 21 years of age?  HPV infection is easily acquired, even with only one sex partner. That is why it is important to get HPV vaccine before any sexual contact takes place. Also, response to the vaccine is better at this age than at older ages. Catch-up vaccination This vaccine is recommended for the following people who have not completed the 3-dose series:   Females 13 through 21 years of age.  Males 13 through 21 years of age. This vaccine may be given to men 22 through 21 years of age who have not completed the 3-dose series. It is recommended for men through age 26 who have sex with men or whose immune system is weakened because of HIV infection, other illness, or medications.  HPV vaccine may be given at the same time as other vaccines. 4. Some people should not get HPV vaccine or should wait.  Anyone who has ever had a life-threatening allergic reaction to any component of HPV vaccine, or to a previous dose of HPV vaccine, should not get the vaccine. Tell your doctor if the person getting vaccinated has any severe allergies, including an allergy to yeast.  HPV vaccine is not recommended for pregnant women. However, receiving HPV vaccine when pregnant is not a reason to consider terminating the pregnancy. Women who are breast   feeding may get the vaccine.  People who are mildly ill when a dose of HPV is planned can still be vaccinated. People with a moderate or severe illness should wait until they are better. 5. What are the risks from this vaccine? This HPV vaccine has been used in the [place] and around the world for about six years and has been very safe. However, any medicine could possibly cause a serious problem, such as a severe allergic reaction. The risk of any vaccine causing a serious injury, or death, is extremely  small. Life-threatening allergic reactions from vaccines are very rare. If they do occur, it would be within a few minutes to a few hours after the vaccination. Several mild to moderate problems are known to occur with this HPV vaccine. These do not last long and go away on their own.  Reactions in the arm where the shot was given:  Pain (about 8 people in 10)  Redness or swelling (about 1 person in 4)  Fever:  Mild (100 F) (about 1 person in 10)  Moderate (102 F) (about 1 person in 65)  Other problems:  Headache (about 1 person in 3)  Fainting: Brief fainting spells and related symptoms (such as jerking movements) can happen after any medical procedure, including vaccination. Sitting or lying down for about 15 minutes after a vaccination can help prevent fainting and injuries caused by falls. Tell your doctor if the patient feels dizzy or light-headed, or has vision changes or ringing in the ears.  Like all vaccines, HPV vaccines will continue to be monitored for unusual or severe problems. 6. What if there is a serious reaction? What should I look for?  Look for anything that concerns you, such as signs of a severe allergic reaction, very high fever, or behavior changes. Signs of a severe allergic reaction can include hives, swelling of the face and throat, difficulty breathing, a fast heartbeat, dizziness, and weakness. These would start a few minutes to a few hours after the vaccination.  What should I do?  If you think it is a severe allergic reaction or other emergency that can't wait, call 9-1-1 or get the person to the nearest hospital. Otherwise, call your doctor.  Afterward, the reaction should be reported to the Vaccine Adverse Event Reporting System (VAERS). Your doctor might file this report, or you can do it yourself through the VAERS web site at www.vaers.hhs.gov, or by calling 1-800-822-7967. VAERS is only for reporting reactions. They do not give medical  advice. 7. The National Vaccine Injury Compensation Program  The National Vaccine Injury Compensation Program (VICP) is a federal program that was created to compensate people who may have been injured by certain vaccines.  Persons who believe they may have been injured by a vaccine can learn about the program and about filing a claim by calling 1-800-338-2382 or visiting the VICP website at www.hrsa.gov/vaccinecompensation. 8. How can I learn more?  Ask your doctor.  Call your local or state health department.  Contact the Centers for Disease Control and Prevention (CDC):  Call 1-800-232-4636 (1-800-CDC-INFO)  or  Visit CDC's website at www.cdc.gov/vaccines CDC Human Papillomavirus (HPV) Gardasil (Interim) 07/31/11 Document Released: 12/28/2005 Document Revised: 07/17/2013 Document Reviewed: 04/13/2013 ExitCare Patient Information 2015 [employer]. This information is not intended to replace advice given to you by your health care provider. Make sure you discuss any questions you have with your health care provider.  

## 2014-10-10 NOTE — Progress Notes (Signed)
   Subjective:    Patient ID: Beth Thomas, female    DOB: 08-15-1995, 19 y.o.   MRN: [id number]  HPI 19 y.o. nonsmoking female presents for BCP follow up. She is sexually active x 1 partner, has not had HPV vaccines.  After negative pregnancy test she was put on lo estrin x 1 month, almost done with first pack. She states she has been more emotional, she has had some spotting on it, headaches while on it.   Current Outpatient Prescriptions on File Prior to Visit  Medication Sig Dispense Refill  . Cetirizine HCl (ZYRTEC ALLERGY) 10 MG CAPS Take 10 mg by mouth daily.    . polyethylene glycol powder (GLYCOLAX/MIRALAX) powder TAKE 17 GRAMS DAILY 527 g 3   No current facility-administered medications on file prior to visit.   Past Medical History  Diagnosis Date  . Allergy   . Unspecified vitamin D deficiency      Review of Systems  Constitutional: Negative for fever, chills and fatigue.  Respiratory: Negative.  Negative for chest tightness and shortness of breath.   Cardiovascular: Negative.  Negative for chest pain and palpitations.  Gastrointestinal: Negative.   Genitourinary: Negative for dysuria, urgency, frequency, hematuria, vaginal bleeding, vaginal discharge and vaginal pain.  Musculoskeletal: Negative.   Skin: Negative.   Psychiatric/Behavioral: Positive for dysphoric mood and decreased concentration. Negative for suicidal ideas, hallucinations, behavioral problems, confusion, sleep disturbance, self-injury and agitation. The patient is not nervous/anxious and is not hyperactive.        Objective:   Physical Exam  Constitutional: She is oriented to person, place, and time. She appears well-developed and well-nourished.  HENT:  Head: Normocephalic and atraumatic.  Right Ear: External ear normal.  Left Ear: External ear normal.  Mouth/Throat: Oropharynx is clear and moist.  Eyes: Conjunctivae and EOM are normal. Pupils are equal, round, and reactive to light.  Neck:  Normal range of motion. Neck supple.  Cardiovascular: Normal rate, regular rhythm and normal heart sounds.  Exam reveals no gallop and no friction rub.   No murmur heard. Pulmonary/Chest: Effort normal and breath sounds normal. No respiratory distress. She has no wheezes.  Abdominal: Soft. Bowel sounds are normal. She exhibits no distension. There is no tenderness.  Musculoskeletal: Normal range of motion.  Neurological: She is alert and oriented to person, place, and time.  Skin: Skin is warm and dry.  Psychiatric: She has a normal mood and affect.       Assessment & Plan:  1. Need for HPV vaccination Reschedule 2 months - HPV 9-valent vaccine,Recombinat  2. Encounter for surveillance of other contraceptive Will switch to ocella, if she continues to have issues can discuss nonestrogen BCP

## 2014-10-23 ENCOUNTER — Ambulatory Visit: Payer: Self-pay | Admitting: Physician Assistant

## 2014-11-20 ENCOUNTER — Other Ambulatory Visit: Payer: Self-pay | Admitting: Emergency Medicine

## 2014-12-12 ENCOUNTER — Encounter: Payer: Self-pay | Admitting: Physician Assistant

## 2014-12-12 ENCOUNTER — Ambulatory Visit (INDEPENDENT_AMBULATORY_CARE_PROVIDER_SITE_OTHER): Payer: 59 | Admitting: Physician Assistant

## 2014-12-12 VITALS — BP 110/60 | HR 99 | Temp 97.7°F | Resp 16 | Ht 63.0 in | Wt 141.0 lb

## 2014-12-12 DIAGNOSIS — Z3049 Encounter for surveillance of other contraceptives: Secondary | ICD-10-CM

## 2014-12-12 DIAGNOSIS — J351 Hypertrophy of tonsils: Secondary | ICD-10-CM

## 2014-12-12 DIAGNOSIS — Z23 Encounter for immunization: Secondary | ICD-10-CM

## 2014-12-12 MED ORDER — DROSPIRENONE-ETHINYL ESTRADIOL 3-0.03 MG PO TABS: 1.0000 | ORAL_TABLET | Freq: Every day | ORAL | Status: DC

## 2014-12-12 NOTE — Progress Notes (Signed)
   Subjective:    Patient ID: Beth Thomas, female    DOB: August 25, 1995, 19 y.o.   MRN: [id number]  HPI 19 y.o. WF on BCP.  Getting HPV vaccines, due for second today. She was switched from estrin to ocella, no more spotting, headaches, or labile emotion. She has been having regular periods. Currently on menses now.   Blood pressure 110/60, pulse 99, temperature 97.7 F (36.5 C), temperature source Temporal, resp. rate 16, height  (1.6 m), weight 141 lb (63.957 kg), last menstrual period 12/10/2014, SpO2 97 %.   Review of Systems  Constitutional: Negative for fever, chills and fatigue.  Respiratory: Negative for chest tightness and shortness of breath.   Cardiovascular: Negative for chest pain and palpitations.  Genitourinary: Negative for dysuria, urgency, frequency, hematuria, vaginal bleeding, vaginal discharge and vaginal pain.       Objective:   Physical Exam  Constitutional: She is oriented to person, place, and time. She appears well-developed and well-nourished.  HENT:  Head: Normocephalic and atraumatic.  Right Ear: External ear normal.  Left Ear: External ear normal.  Mouth/Throat: Oropharynx is clear and moist.  Large tonsils.   Eyes: Conjunctivae and EOM are normal. Pupils are equal, round, and reactive to light.  Neck: Normal range of motion. Neck supple.  Cardiovascular: Normal rate, regular rhythm and normal heart sounds.  Exam reveals no gallop and no friction rub.   No murmur heard. Pulmonary/Chest: Effort normal and breath sounds normal. No respiratory distress. She has no wheezes.  Abdominal: Soft. Bowel sounds are normal. She exhibits no distension. There is no tenderness.  Musculoskeletal: Normal range of motion.  Neurological: She is alert and oriented to person, place, and time.  Skin: Skin is warm and dry.  Psychiatric: She has a normal mood and affect.      Assessment & Plan:  BCP- will refill x 1 year, will need PAP age 43, discussed BCP and  ABX/missing doses, discussed safe sex.  HPV vaccine- today and then 4 months.   Schedule CPE and HPV vaccine 4 months out with PAP

## 2014-12-12 NOTE — Patient Instructions (Signed)
Hormonal Contraception Information Estrogen and progesterone (progestin) are hormones used in many forms of birth control (contraception). These two hormones make up most hormonal contraceptives. Hormonal contraceptives use either:   A combination of estrogen hormone and progesterone hormone in one of these forms:  Pill--Pills come in various combinations of active hormone pills and nonhormonal pills. Different combinations of pills may give you a period once a month, once every 3 months, or no period at all. It is important to take the pills the same time each day.  Patch--The patch is placed on the lower abdomen every week for 3 weeks. On the fourth week, the patch is not placed.  Vaginal ring--The ring is placed in the vagina and left there for 3 weeks. It is then removed for 1 week.  Progesterone alone in one of these forms:  Pill--Hormone pills are taken every day of the cycle.  Intrauterine device (IUD)--The IUD is inserted during a menstrual period and removed or replaced every 5 years or sooner.  Implant--Plastic rods are placed under the skin of the upper arm. They are removed or replaced every 3 years or sooner.  Injection--The injection is given once every 90 days. Pregnancy can still occur with any of these hormonal contraceptive methods. If you have any suspicion that you might be pregnant, take a pregnancy test and talk to your health care provider.  ESTROGEN AND PROGESTERONE CONTRACEPTIVES Estrogen and progesterone contraceptives can prevent pregnancy by:  Stopping the release of an egg (ovulation).  Thickening the mucus of the cervix, making it difficult for sperm to enter the uterus.  Changing the lining of the uterus. This change makes it more difficult for an egg to implant. Side effects from estrogen occur more often in the first 2-3 months. Talk to your health care provider about what side effects may affect you. If you develop persistent side effects or they are  severe, talk to your health care provider. PROGESTERONE CONTRACEPTIVES Progesterone-only contraceptives can prevent pregnancy by:   Blocking ovulation. This occurs in many women, but some women will continue to ovulate.   Preventing the entry of sperm into the uterus by keeping the cervical mucus thick and sticky.   Changing the lining of the uterus. This change makes it more difficult for an egg to implant.  Side effects of progesterone can vary. Talk to your health care provider about what side effects may affect you. If you develop persistent side effects or they are severe, talk to your health care provider.  Document Released: 03/22/2007 Document Revised: 11/02/2012 Document Reviewed: 08/14/2012 [facility] Patient Information 2015 [place], [place]. This information is not intended to replace advice given to you by your health care provider. Make sure you discuss any questions you have with your health care provider.   Human Papillomavirus HPV stands for human papillomavirus. There are many different types of HPV. People of all ages and races can get an HPV infection. In females, some types of HPV can cause warts on the sex organs or anus. Some females never see any warts on the outside, yet still have the infection inside. The doctor will need to check the sex organs and do a Pap test to see there is an HPV infection. The only sign of infection may be a Pap test that is abnormal. A female who has HPV has a higher chance of getting cancer of the sex organs or anus. It is very rare, but some females can give their babies the HPV infection when the  baby is being born. Some of these babies can grow warts on the voice box (vocal cords). Males with HPV have a higher chance of getting cancer of the penis or anus. A female may have HPV but not see any warts on his penis or anus. There is no test to find HPV in males, so even if warts are not seen, there may still be an infection. HOME CARE   Take  medicines as told by your doctor.  Get needed Pap tests.  Keep follow-up exams.  Do not touch or scratch the warts.  Do not treat warts with medicines used for treating hand warts.  Tell your sex partner about your infection because he or she may also need treatment.  Do not have sex while you are getting treatment.  After treatment, use condoms during sex.  Use over-the-counter creams for itching as told by your doctor.  Use over-the-counter or prescription medicines for pain, discomfort or fever as told by your doctor.  Do not douche or use tampons during treatment of HPV. GET HELP RIGHT AWAY IF:  You have a temperature by mouth above 102 F (38.9 C), not controlled by medicine. MAKE SURE YOU:   Understand these instructions.  Will watch your condition.  Will get help right away if you are not doing well or get worse. Document Released: 02/13/2008 Document Revised: 05/25/2011 Document Reviewed: 06/07/2013 [facility] Patient Information 2015 [place], [place]. This information is not intended to replace advice given to you by your health care provider. Make sure you discuss any questions you have with your health care provider.

## 2015-03-14 ENCOUNTER — Other Ambulatory Visit: Payer: Self-pay

## 2015-03-14 MED ORDER — POLYETHYLENE GLYCOL 3350 17 GM/SCOOP PO POWD: ORAL | Status: DC

## 2015-04-15 ENCOUNTER — Encounter: Payer: Self-pay | Admitting: Physician Assistant

## 2015-05-14 ENCOUNTER — Ambulatory Visit (INDEPENDENT_AMBULATORY_CARE_PROVIDER_SITE_OTHER): Payer: BLUE CROSS/BLUE SHIELD | Admitting: Physician Assistant

## 2015-05-14 ENCOUNTER — Encounter: Payer: Self-pay | Admitting: Physician Assistant

## 2015-05-14 ENCOUNTER — Other Ambulatory Visit (HOSPITAL_COMMUNITY)
Admission: RE | Admit: 2015-05-14 | Discharge: 2015-05-14 | Disposition: A | Discharge status: Home / Self Care | Payer: BLUE CROSS/BLUE SHIELD | Source: Ambulatory Visit | Attending: Physician Assistant | Admitting: Physician Assistant

## 2015-05-14 VITALS — BP 110/76 | HR 96 | Temp 98.1°F | Resp 16 | Ht 64.5 in | Wt 138.2 lb

## 2015-05-14 DIAGNOSIS — Z01419 Encounter for gynecological examination (general) (routine) without abnormal findings: Secondary | ICD-10-CM | POA: Diagnosis present

## 2015-05-14 DIAGNOSIS — Z0000 Encounter for general adult medical examination without abnormal findings: Secondary | ICD-10-CM

## 2015-05-14 DIAGNOSIS — Z139 Encounter for screening, unspecified: Secondary | ICD-10-CM

## 2015-05-14 DIAGNOSIS — Z0001 Encounter for general adult medical examination with abnormal findings: Secondary | ICD-10-CM | POA: Diagnosis not present

## 2015-05-14 DIAGNOSIS — Z79899 Other long term (current) drug therapy: Secondary | ICD-10-CM

## 2015-05-14 DIAGNOSIS — Z124 Encounter for screening for malignant neoplasm of cervix: Secondary | ICD-10-CM

## 2015-05-14 DIAGNOSIS — Z13220 Encounter for screening for lipoid disorders: Secondary | ICD-10-CM

## 2015-05-14 DIAGNOSIS — E559 Vitamin D deficiency, unspecified: Secondary | ICD-10-CM | POA: Diagnosis not present

## 2015-05-14 DIAGNOSIS — R7309 Other abnormal glucose: Secondary | ICD-10-CM | POA: Insufficient documentation

## 2015-05-14 DIAGNOSIS — D649 Anemia, unspecified: Secondary | ICD-10-CM

## 2015-05-14 DIAGNOSIS — B977 Papillomavirus as the cause of diseases classified elsewhere: Secondary | ICD-10-CM

## 2015-05-14 DIAGNOSIS — IMO0002 Reserved for concepts with insufficient information to code with codable children: Secondary | ICD-10-CM

## 2015-05-14 DIAGNOSIS — R888 Abnormal findings in other body fluids and substances: Secondary | ICD-10-CM | POA: Diagnosis not present

## 2015-05-14 DIAGNOSIS — Z23 Encounter for immunization: Secondary | ICD-10-CM | POA: Diagnosis not present

## 2015-05-14 DIAGNOSIS — Z113 Encounter for screening for infections with a predominantly sexual mode of transmission: Secondary | ICD-10-CM

## 2015-05-14 DIAGNOSIS — R87810 Cervical high risk human papillomavirus (HPV) DNA test positive: Secondary | ICD-10-CM

## 2015-05-14 DIAGNOSIS — J309 Allergic rhinitis, unspecified: Secondary | ICD-10-CM

## 2015-05-14 DIAGNOSIS — Z1151 Encounter for screening for human papillomavirus (HPV): Secondary | ICD-10-CM | POA: Diagnosis not present

## 2015-05-14 DIAGNOSIS — R6889 Other general symptoms and signs: Secondary | ICD-10-CM | POA: Diagnosis not present

## 2015-05-14 DIAGNOSIS — Z1389 Encounter for screening for other disorder: Secondary | ICD-10-CM

## 2015-05-14 LAB — CBC WITH DIFFERENTIAL/PLATELET
Basophils Absolute: 0 10*3/uL (ref 0.0–0.1)
Basophils Relative: 0 % (ref 0–1)
Eosinophils Absolute: 0.1 10*3/uL (ref 0.0–0.7)
Eosinophils Relative: 1 % (ref 0–5)
HCT: 39 % (ref 36.0–46.0)
Hemoglobin: 13.2 g/dL (ref 12.0–15.0)
Lymphocytes Relative: 33 % (ref 12–46)
Lymphs Abs: 2.4 10*3/uL (ref 0.7–4.0)
MCH: 29.5 pg (ref 26.0–34.0)
MCHC: 33.8 g/dL (ref 30.0–36.0)
MCV: 87.1 fL (ref 78.0–100.0)
MPV: 10.8 fL (ref 8.6–12.4)
Monocytes Absolute: 0.5 10*3/uL (ref 0.1–1.0)
Monocytes Relative: 7 % (ref 3–12)
Neutro Abs: 4.2 10*3/uL (ref 1.7–7.7)
Neutrophils Relative %: 59 % (ref 43–77)
Platelets: 272 10*3/uL (ref 150–400)
RBC: 4.48 MIL/uL (ref 3.87–5.11)
RDW: 14.1 % (ref 11.5–15.5)
WBC: 7.2 10*3/uL (ref 4.0–10.5)

## 2015-05-14 NOTE — Patient Instructions (Signed)
Preventive Care for Adults A healthy lifestyle and preventive care can promote health and wellness. Preventive health guidelines for women include the following key practices.  A routine yearly physical is a good way to check with your health care provider about your health and preventive screening. It is a chance to share any concerns and updates on your health and to receive a thorough exam.  Visit your dentist for a routine exam and preventive care every 6 months. Brush your teeth twice a day and floss once a day. Good oral hygiene prevents tooth decay and gum disease.  The frequency of eye exams is based on your age, health, family medical history, use of contact lenses, and other factors. Follow your health care provider's recommendations for frequency of eye exams.  Eat a healthy diet. Foods like vegetables, fruits, whole grains, low-fat dairy products, and lean protein foods contain the nutrients you need without too many calories. Decrease your intake of foods high in solid fats, added sugars, and salt. Eat the right amount of calories for you.Get information about a proper diet from your health care provider, if necessary.  Regular physical exercise is one of the most important things you can do for your health. Most adults should get at least 150 minutes of moderate-intensity exercise (any activity that increases your heart rate and causes you to sweat) each week. In addition, most adults need muscle-strengthening exercises on 2 or more days a week.  Maintain a healthy weight. The body mass index (BMI) is a screening tool to identify possible weight problems. It provides an estimate of body fat based on height and weight. Your health care provider can find your BMI and can help you achieve or maintain a healthy weight.For adults 20 years and older:  A BMI below 18.5 is considered underweight.  A BMI of 18.5 to 24.9 is normal.  A BMI of 25 to 29.9 is considered overweight.  A BMI of  30 and above is considered obese.  Maintain normal blood lipids and cholesterol levels by exercising and minimizing your intake of saturated fat. Eat a balanced diet with plenty of fruit and vegetables. Blood tests for lipids and cholesterol should begin at age 76 and be repeated every 5 years. If your lipid or cholesterol levels are high, you are over 50, or you are at high risk for heart disease, you may need your cholesterol levels checked more frequently.Ongoing high lipid and cholesterol levels should be treated with medicines if diet and exercise are not working.  If you smoke, find out from your health care provider how to quit. If you do not use tobacco, do not start.  Lung cancer screening is recommended for adults aged 22-80 years who are at high risk for developing lung cancer because of a history of smoking. A yearly low-dose CT scan of the lungs is recommended for people who have at least a 30-pack-year history of smoking and are a current smoker or have quit within the past 15 years. A pack year of smoking is smoking an average of 1 pack of cigarettes a day for 1 year (for example: 1 pack a day for 30 years or 2 packs a day for 15 years). Yearly screening should continue until the smoker has stopped smoking for at least 15 years. Yearly screening should be stopped for people who develop a health problem that would prevent them from having lung cancer treatment.  If you are pregnant, do not drink alcohol. If you are breastfeeding,  be very cautious about drinking alcohol. If you are not pregnant and choose to drink alcohol, do not have more than 1 drink per day. One drink is considered to be 12 ounces (355 mL) of beer, 5 ounces (148 mL) of wine, or 1.5 ounces (44 mL) of liquor.  Avoid use of street drugs. Do not share needles with anyone. Ask for help if you need support or instructions about stopping the use of drugs.  High blood pressure causes heart disease and increases the risk of  stroke. Your blood pressure should be checked at least every 1 to 2 years. Ongoing high blood pressure should be treated with medicines if weight loss and exercise do not work.  If you are 75-52 years old, ask your health care provider if you should take aspirin to prevent strokes.  Diabetes screening involves taking a blood sample to check your fasting blood sugar level. This should be done once every 3 years, after age 15, if you are within normal weight and without risk factors for diabetes. Testing should be considered at a younger age or be carried out more frequently if you are overweight and have at least 1 risk factor for diabetes.  Breast cancer screening is essential preventive care for women. You should practice "breast self-awareness." This means understanding the normal appearance and feel of your breasts and may include breast self-examination. Any changes detected, no matter how small, should be reported to a health care provider. Women in their 58s and 30s should have a clinical breast exam (CBE) by a health care provider as part of a regular health exam every 1 to 3 years. After age 16, women should have a CBE every year. Starting at age 53, women should consider having a mammogram (breast X-ray test) every year. Women who have a family history of breast cancer should talk to their health care provider about genetic screening. Women at a high risk of breast cancer should talk to their health care providers about having an MRI and a mammogram every year.  Breast cancer gene (BRCA)-related cancer risk assessment is recommended for women who have family members with BRCA-related cancers. BRCA-related cancers include breast, ovarian, tubal, and peritoneal cancers. Having family members with these cancers may be associated with an increased risk for harmful changes (mutations) in the breast cancer genes BRCA1 and BRCA2. Results of the assessment will determine the need for genetic counseling and  BRCA1 and BRCA2 testing.  Routine pelvic exams to screen for cancer are no longer recommended for nonpregnant women who are considered low risk for cancer of the pelvic organs (ovaries, uterus, and vagina) and who do not have symptoms. Ask your health care provider if a screening pelvic exam is right for you.  If you have had past treatment for cervical cancer or a condition that could lead to cancer, you need Pap tests and screening for cancer for at least 20 years after your treatment. If Pap tests have been discontinued, your risk factors (such as having a new sexual partner) need to be reassessed to determine if screening should be resumed. Some women have medical problems that increase the chance of getting cervical cancer. In these cases, your health care provider may recommend more frequent screening and Pap tests.  The HPV test is an additional test that may be used for cervical cancer screening. The HPV test looks for the virus that can cause the cell changes on the cervix. The cells collected during the Pap test can be  tested for HPV. The HPV test could be used to screen women aged 30 years and older, and should be used in women of any age who have unclear Pap test results. After the age of 30, women should have HPV testing at the same frequency as a Pap test.  Colorectal cancer can be detected and often prevented. Most routine colorectal cancer screening begins at the age of 50 years and continues through age 75 years. However, your health care provider may recommend screening at an earlier age if you have risk factors for colon cancer. On a yearly basis, your health care provider may provide home test kits to check for hidden blood in the stool. Use of a small camera at the end of a tube, to directly examine the colon (sigmoidoscopy or colonoscopy), can detect the earliest forms of colorectal cancer. Talk to your health care provider about this at age 50, when routine screening begins. Direct  exam of the colon should be repeated every 5-10 years through age 75 years, unless early forms of pre-cancerous polyps or small growths are found.  People who are at an increased risk for hepatitis B should be screened for this virus. You are considered at high risk for hepatitis B if:  You were born in a country where hepatitis B occurs often. Talk with your health care provider about which countries are considered high risk.  Your parents were born in a high-risk country and you have not received a shot to protect against hepatitis B (hepatitis B vaccine).  You have HIV or AIDS.  You use needles to inject street drugs.  You live with, or have sex with, someone who has hepatitis B.  You get hemodialysis treatment.  You take certain medicines for conditions like cancer, organ transplantation, and autoimmune conditions.  Hepatitis C blood testing is recommended for all people born from 1945 through 1965 and any individual with known risks for hepatitis C.  Practice safe sex. Use condoms and avoid high-risk sexual practices to reduce the spread of sexually transmitted infections (STIs). STIs include gonorrhea, chlamydia, syphilis, trichomonas, herpes, HPV, and human immunodeficiency virus (HIV). Herpes, HIV, and HPV are viral illnesses that have no cure. They can result in disability, cancer, and death.  You should be screened for sexually transmitted illnesses (STIs) including gonorrhea and chlamydia if:  You are sexually active and are younger than 24 years.  You are older than 24 years and your health care provider tells you that you are at risk for this type of infection.  Your sexual activity has changed since you were last screened and you are at an increased risk for chlamydia or gonorrhea. Ask your health care provider if you are at risk.  If you are at risk of being infected with HIV, it is recommended that you take a prescription medicine daily to prevent HIV infection. This is  called preexposure prophylaxis (PrEP). You are considered at risk if:  You are a heterosexual woman, are sexually active, and are at increased risk for HIV infection.  You take drugs by injection.  You are sexually active with a partner who has HIV.  Talk with your health care provider about whether you are at high risk of being infected with HIV. If you choose to begin PrEP, you should first be tested for HIV. You should then be tested every 3 months for as long as you are taking PrEP.  Osteoporosis is a disease in which the bones lose minerals and strength   with aging. This can result in serious bone fractures or breaks. The risk of osteoporosis can be identified using a bone density scan. Women ages 65 years and over and women at risk for fractures or osteoporosis should discuss screening with their health care providers. Ask your health care provider whether you should take a calcium supplement or vitamin D to reduce the rate of osteoporosis.  Menopause can be associated with physical symptoms and risks. Hormone replacement therapy is available to decrease symptoms and risks. You should talk to your health care provider about whether hormone replacement therapy is right for you.  Use sunscreen. Apply sunscreen liberally and repeatedly throughout the day. You should seek shade when your shadow is shorter than you. Protect yourself by wearing long sleeves, pants, a wide-brimmed hat, and sunglasses year round, whenever you are outdoors.  Once a month, do a whole body skin exam, using a mirror to look at the skin on your back. Tell your health care provider of new moles, moles that have irregular borders, moles that are larger than a pencil eraser, or moles that have changed in shape or color.  Stay current with required vaccines (immunizations).  Influenza vaccine. All adults should be immunized every year.  Tetanus, diphtheria, and acellular pertussis (Td, Tdap) vaccine. Pregnant women should  receive 1 dose of Tdap vaccine during each pregnancy. The dose should be obtained regardless of the length of time since the last dose. Immunization is preferred during the 27th-36th week of gestation. An adult who has not previously received Tdap or who does not know her vaccine status should receive 1 dose of Tdap. This initial dose should be followed by tetanus and diphtheria toxoids (Td) booster doses every 10 years. Adults with an unknown or incomplete history of completing a 3-dose immunization series with Td-containing vaccines should begin or complete a primary immunization series including a Tdap dose. Adults should receive a Td booster every 10 years.  Varicella vaccine. An adult without evidence of immunity to varicella should receive 2 doses or a second dose if she has previously received 1 dose. Pregnant females who do not have evidence of immunity should receive the first dose after pregnancy. This first dose should be obtained before leaving the health care facility. The second dose should be obtained 4-8 weeks after the first dose.  Human papillomavirus (HPV) vaccine. Females aged 13-26 years who have not received the vaccine previously should obtain the 3-dose series. The vaccine is not recommended for use in pregnant females. However, pregnancy testing is not needed before receiving a dose. If a female is found to be pregnant after receiving a dose, no treatment is needed. In that case, the remaining doses should be delayed until after the pregnancy. Immunization is recommended for any person with an immunocompromised condition through the age of 26 years if she did not get any or all doses earlier. During the 3-dose series, the second dose should be obtained 4-8 weeks after the first dose. The third dose should be obtained 24 weeks after the first dose and 16 weeks after the second dose.  Zoster vaccine. One dose is recommended for adults aged 60 years or older unless certain conditions are  present.  Measles, mumps, and rubella (MMR) vaccine. Adults born before 1957 generally are considered immune to measles and mumps. Adults born in 1957 or later should have 1 or more doses of MMR vaccine unless there is a contraindication to the vaccine or there is laboratory evidence of immunity to   each of the three diseases. A routine second dose of MMR vaccine should be obtained at least 28 days after the first dose for students attending postsecondary schools, health care workers, or international travelers. People who received inactivated measles vaccine or an unknown type of measles vaccine during 1963-1967 should receive 2 doses of MMR vaccine. People who received inactivated mumps vaccine or an unknown type of mumps vaccine before 1979 and are at high risk for mumps infection should consider immunization with 2 doses of MMR vaccine. For females of childbearing age, rubella immunity should be determined. If there is no evidence of immunity, females who are not pregnant should be vaccinated. If there is no evidence of immunity, females who are pregnant should delay immunization until after pregnancy. Unvaccinated health care workers born before 1957 who lack laboratory evidence of measles, mumps, or rubella immunity or laboratory confirmation of disease should consider measles and mumps immunization with 2 doses of MMR vaccine or rubella immunization with 1 dose of MMR vaccine.  Pneumococcal 13-valent conjugate (PCV13) vaccine. When indicated, a person who is uncertain of her immunization history and has no record of immunization should receive the PCV13 vaccine. An adult aged 19 years or older who has certain medical conditions and has not been previously immunized should receive 1 dose of PCV13 vaccine. This PCV13 should be followed with a dose of pneumococcal polysaccharide (PPSV23) vaccine. The PPSV23 vaccine dose should be obtained at least 8 weeks after the dose of PCV13 vaccine. An adult aged 19  years or older who has certain medical conditions and previously received 1 or more doses of PPSV23 vaccine should receive 1 dose of PCV13. The PCV13 vaccine dose should be obtained 1 or more years after the last PPSV23 vaccine dose.  Pneumococcal polysaccharide (PPSV23) vaccine. When PCV13 is also indicated, PCV13 should be obtained first. All adults aged 65 years and older should be immunized. An adult younger than age 65 years who has certain medical conditions should be immunized. Any person who resides in a nursing home or long-term care facility should be immunized. An adult smoker should be immunized. People with an immunocompromised condition and certain other conditions should receive both PCV13 and PPSV23 vaccines. People with human immunodeficiency virus (HIV) infection should be immunized as soon as possible after diagnosis. Immunization during chemotherapy or radiation therapy should be avoided. Routine use of PPSV23 vaccine is not recommended for American Indians, [place] Natives, or people younger than 65 years unless there are medical conditions that require PPSV23 vaccine. When indicated, people who have unknown immunization and have no record of immunization should receive PPSV23 vaccine. One-time revaccination 5 years after the first dose of PPSV23 is recommended for people aged 19-64 years who have chronic kidney failure, nephrotic syndrome, asplenia, or immunocompromised conditions. People who received 1-2 doses of PPSV23 before age 65 years should receive another dose of PPSV23 vaccine at age 65 years or later if at least 5 years have passed since the previous dose. Doses of PPSV23 are not needed for people immunized with PPSV23 at or after age 65 years.  Meningococcal vaccine. Adults with asplenia or persistent complement component deficiencies should receive 2 doses of quadrivalent meningococcal conjugate (MenACWY-D) vaccine. The doses should be obtained at least 2 months apart.  Microbiologists working with certain meningococcal bacteria, military recruits, people at risk during an outbreak, and people who travel to or live in countries with a high rate of meningitis should be immunized. A first-year college student up through age   21 years who is living in a residence hall should receive a dose if she did not receive a dose on or after her 16th birthday. Adults who have certain high-risk conditions should receive one or more doses of vaccine.  Hepatitis A vaccine. Adults who wish to be protected from this disease, have certain high-risk conditions, work with hepatitis A-infected animals, work in hepatitis A research labs, or travel to or work in countries with a high rate of hepatitis A should be immunized. Adults who were previously unvaccinated and who anticipate close contact with an international adoptee during the first 60 days after arrival in the [place] from a country with a high rate of hepatitis A should be immunized.  Hepatitis B vaccine. Adults who wish to be protected from this disease, have certain high-risk conditions, may be exposed to blood or other infectious body fluids, are household contacts or sex partners of hepatitis B positive people, are clients or workers in certain care facilities, or travel to or work in countries with a high rate of hepatitis B should be immunized.  Haemophilus influenzae type b (Hib) vaccine. A previously unvaccinated person with asplenia or sickle cell disease or having a scheduled splenectomy should receive 1 dose of Hib vaccine. Regardless of previous immunization, a recipient of a hematopoietic stem cell transplant should receive a 3-dose series 6-12 months after her successful transplant. Hib vaccine is not recommended for adults with HIV infection. Preventive Services / Frequency  Ages 19 to 39 years 1. Blood pressure check. 2. Lipid and cholesterol check. 3. Clinical breast exam.** / Every 3 years for women in their  20s and 30s. 4. BRCA-related cancer risk assessment.** / For women who have family members with a BRCA-related cancer (breast, ovarian, tubal, or peritoneal cancers). 5. Pap test.** / Every 2 years from ages 21 through 29. Every 3 years starting at age 30 through age 65 or 70 with a history of 3 consecutive normal Pap tests. 6. HPV screening.** / Every 3 years from ages 30 through ages 65 to 70 with a history of 3 consecutive normal Pap tests. 7. Hepatitis C blood test.** / For any individual with known risks for hepatitis C. 8. Skin self-exam. / Monthly. 9. Influenza vaccine. / Every year. 10. Tetanus, diphtheria, and acellular pertussis (Tdap, Td) vaccine.** / Consult your health care provider. Pregnant women should receive 1 dose of Tdap vaccine during each pregnancy. 1 dose of Td every 10 years. 11. Varicella vaccine.** / Consult your health care provider. Pregnant females who do not have evidence of immunity should receive the first dose after pregnancy. 12. HPV vaccine. / 3 doses over 6 months, if 26 and younger. The vaccine is not recommended for use in pregnant females. However, pregnancy testing is not needed before receiving a dose. 13. Measles, mumps, rubella (MMR) vaccine.** / You need at least 1 dose of MMR if you were born in 1957 or later. You may also need a 2nd dose. For females of childbearing age, rubella immunity should be determined. If there is no evidence of immunity, females who are not pregnant should be vaccinated. If there is no evidence of immunity, females who are pregnant should delay immunization until after pregnancy. 14. Pneumococcal 13-valent conjugate (PCV13) vaccine.** / Consult your health care provider. 15. Pneumococcal polysaccharide (PPSV23) vaccine.** / 1 to 2 doses if you smoke cigarettes or if you have certain conditions. 16. Meningococcal vaccine.** / 1 dose if you are age 19 to 21 years and a   first-year college student living in a residence hall, or have one  of several medical conditions, you need to get vaccinated against meningococcal disease. You may also need additional booster doses. 17. Hepatitis A vaccine.** / Consult your health care provider. 18. Hepatitis B vaccine.** / Consult your health care provider. 19. Haemophilus influenzae type b (Hib) vaccine.** / Consult your health care provider.  24. Chlamydia, HIV, and other sexually transmitted diseases- Get screened every year until age 54, then within three months of each new sexual provider. 21. Pap Smear- Every 1-3 years; discuss with your health care provider. 28. Mammogram- Every year starting at age 40  Take these steps 1. Do not smoke-Your healthcare provider can help you quit.  For tips on how to quit go to www.smokefree.gov or call 1-800 QUITNOW. 2. Be physically active- Exercise 5 days a week for at least 30 minutes.  If you are not already physically active, start slow and gradually work up to 30 minutes of moderate physical activity.  Examples of moderate activity include walking briskly, dancing, swimming, bicycling, etc. 3. Breast Cancer- A self breast exam every month is important for early detection of breast cancer.  For more information and instruction on self breast exams, ask your healthcare provider or https://www.patel.info/. 4. Eat a healthy diet- Eat a variety of healthy foods such as fruits, vegetables, whole grains, low fat milk, low fat cheeses, yogurt, lean meats, poultry and fish, beans, nuts, tofu, etc.  For more information go to www. Thenutritionsource.org 5. Drink alcohol in moderation- Limit alcohol intake to one drink or less per day. Never drink and drive. 6. Depression- Your emotional health is as important as your physical health.  If you're feeling down or losing interest in things you normally enjoy please talk to your healthcare provider about being screened for depression. 7. Dental visit- Brush and floss your teeth twice daily;  visit your dentist twice a year. 8. Eye doctor- Get an eye exam at least every 2 years. 9. Helmet use- Always wear a helmet when riding a bicycle, motorcycle, rollerblading or skateboarding. 6. Safe sex- If you may be exposed to sexually transmitted infections, use a condom. 11. Seat belts- Seat belts can save your live; always wear one. 12. Smoke/Carbon Monoxide detectors- These detectors need to be installed on the appropriate level of your home. Replace batteries at least once a year. 13. Skin cancer- When out in the sun please cover up and use sunscreen 15 SPF or higher. 14. Violence- If anyone is threatening or hurting you, please tell your healthcare provider.

## 2015-05-14 NOTE — Progress Notes (Signed)
Complete Physical  Assessment and Plan: 1. Allergic rhinitis, unspecified allergic rhinitis type  2. Vitamin D deficiency - VITAMIN D 25 Hydroxy (Vit-D Deficiency, Fractures)  3. Other abnormal glucose - TSH - Hemoglobin A1c - EKG 12-Lead  4. Need for HPV vaccination - HPV 9-valent vaccine,Recombinat  5. Screening cholesterol level - Lipid panel  6. Screening for blood or protein in urine - Urinalysis, Routine w reflex microscopic (not at [facility]) - Microalbumin / creatinine urine ratio  7. Screening for cervical cancer - Cytology - PAP  8. Medication management - CBC with Differential/Platelet - BASIC METABOLIC PANEL WITH GFR - Hepatic function panel - Magnesium  9. Routine general medical examination at a health care facility Health Maintenance- Discussed STD testing, safe sex, alcohol and drug awareness, drinking and driving dangers, wearing a seat belt and general safety measures for young adult. - EKG 12-Lead - Cytology - PAP  10. Anemia, unspecified anemia type - Iron and TIBC - Ferritin - Vitamin B12  11. Screen for STD (sexually transmitted disease) - GC/Chlamydia Probe Amp - RPR - HIV antibody - HSV(herpes simplex vrs) 1+2 ab-IgG  . Discussed med's effects and SE's. Screening labs and tests as requested with regular follow-up as recommended. Over 40 minutes of exam, counseling, chart review and critical decision making was performed  HPI  This very nice 20 y.o.female presents for complete physical.  Patient has no major health issues.  Patient reports no complaints at this time.  She does workout.  Finally, patient has history of Vitamin D Deficiency, not on medication.  Was on BCP but has broken up with boyfriend and not on it anymore.   Current Medications:  Current Outpatient Prescriptions on File Prior to Visit  Medication Sig Dispense Refill  . Cetirizine HCl (ZYRTEC ALLERGY) 10 MG CAPS Take 10 mg by mouth daily.    . polyethylene glycol powder  (GLYCOLAX/MIRALAX) powder TAKE 17 GRAMS DAILY AS DIRECTED BY PRESCRIBER. 527 g 3   No current facility-administered medications on file prior to visit.   Health Maintenance:   Immunization History  Administered Date(s) Administered  . HPV 9-valent 10/10/2014, 12/12/2014  . Tdap 11/15/2006   TD/TDAP: 2008 Influenza: declines Pneumovax: N/a Prevnar 13: N/a HPV vaccine will get last today  LMP: Patient's last menstrual period was 05/02/2015. Sexually Active: not at this time x1 , willing to get STD Pap: never MGM:  [place] left breast Last Dental Exam: Last Eye Exam:  Allergies: No Known Allergies Medical History:  Past Medical History  Diagnosis Date  . Allergy   . Unspecified vitamin D deficiency    Surgical History: No past surgical history on file. Family History:  Family History  Problem Relation Age of Onset  . Hyperlipidemia Mother   . Hypertension Mother   . Diabetes Mother   . Hypertension Father   . Hyperlipidemia Father    Social History:  Social History  Substance Use Topics  . Smoking status: Passive Smoke Exposure - Never Smoker  . Smokeless tobacco: None  . Alcohol Use: No   Review of Systems: Review of Systems  Constitutional: Negative.   HENT: Negative.   Eyes: Negative.   Respiratory: Negative.   Cardiovascular: Negative.   Gastrointestinal: Negative.   Genitourinary: Negative.   Musculoskeletal: Negative.   Skin: Negative.   Neurological: Negative.   Endo/Heme/Allergies: Negative.   Psychiatric/Behavioral: Negative.     Physical Exam: Estimated body mass index is 23.36 kg/(m^2) as calculated from the following:   Height as of  this encounter: 5' 4.5" (1.638 m).   Weight as of this encounter: 138 lb 3.2 oz (62.687 kg). BP 110/76 mmHg  Pulse 96  Temp(Src) 98.1 F (36.7 C) (Temporal)  Resp 16  Ht 5' 4.5" (1.638 m)  Wt 138 lb 3.2 oz (62.687 kg)  BMI 23.36 kg/m2  SpO2 97%  LMP 05/02/2015 General Appearance: Well nourished, in no  apparent distress.  Eyes: PERRLA, EOMs, conjunctiva no swelling or erythema, normal fundi and vessels.  Sinuses: No Frontal/maxillary tenderness  ENT/Mouth: Ext aud canals clear, normal light reflex with TMs without erythema, bulging. Good dentition. No erythema, swelling, or exudate on post pharynx. Tonsils not swollen or erythematous. Hearing normal.  Neck: Supple, thyroid normal. No bruits  Respiratory: Respiratory effort normal, BS equal bilaterally without rales, rhonchi, wheezing or stridor.  Cardio: RRR without murmurs, rubs or gallops. Brisk peripheral pulses without edema.  Chest: symmetric, with normal excursions and percussion.  Breasts: Symmetric, without lumps, nipple discharge, retractions.  Abdomen: Soft, nontender, no guarding, rebound, hernias, masses, or organomegaly.  Lymphatics: Non tender without lymphadenopathy.  Genitourinary: normal external genitalia, vulva, vagina, cervix, uterus and adnexa, PAP: Pap smear done today. Musculoskeletal: Full ROM all peripheral extremities,5/5 strength, and normal gait.  Skin: Warm, dry without rashes, lesions, ecchymosis. Neuro: Cranial nerves intact, reflexes equal bilaterally. Normal muscle tone, no cerebellar symptoms. Sensation intact.  Psych: Awake and oriented X 3, normal affect, Insight and Judgment appropriate.   EKG: WNL  Quentin Mulling 3:10 PM [facility]

## 2015-05-15 LAB — MICROALBUMIN / CREATININE URINE RATIO
Creatinine, Urine: 195 mg/dL (ref 20–320)
Microalb Creat Ratio: 34 mcg/mg creat — ABNORMAL HIGH (ref <30)
Microalb, Ur: 6.6 mg/dL

## 2015-05-15 LAB — BASIC METABOLIC PANEL WITH GFR
BUN: 12 mg/dL (ref 7–25)
CO2: 28 mmol/L (ref 20–31)
Calcium: 9.9 mg/dL (ref 8.6–10.2)
Chloride: 99 mmol/L (ref 98–110)
Creat: 0.74 mg/dL (ref 0.50–1.10)
GFR, Est African American: >89 mL/min (ref >=60)
GFR, Est Non African American: >89 mL/min (ref >=60)
Glucose, Bld: 92 mg/dL (ref 65–99)
Potassium: 4 mmol/L (ref 3.5–5.3)
Sodium: 137 mmol/L (ref 135–146)

## 2015-05-15 LAB — LIPID PANEL
Cholesterol: 202 mg/dL — ABNORMAL HIGH (ref 125–200)
HDL: 41 mg/dL — ABNORMAL LOW (ref >=46)
LDL Cholesterol: 150 mg/dL — ABNORMAL HIGH (ref <130)
Total CHOL/HDL Ratio: 4.9 Ratio (ref <=5.0)
Triglycerides: 53 mg/dL (ref <150)
VLDL: 11 mg/dL (ref <30)

## 2015-05-15 LAB — HEPATIC FUNCTION PANEL
ALT: 11 U/L (ref 6–29)
AST: 17 U/L (ref 10–30)
Albumin: 4.5 g/dL (ref 3.6–5.1)
Alkaline Phosphatase: 44 U/L (ref 33–115)
Bilirubin, Direct: 0.1 mg/dL (ref <=0.2)
Indirect Bilirubin: 0.4 mg/dL (ref 0.2–1.2)
Total Bilirubin: 0.5 mg/dL (ref 0.2–1.2)
Total Protein: 8 g/dL (ref 6.1–8.1)

## 2015-05-15 LAB — URINALYSIS, ROUTINE W REFLEX MICROSCOPIC
Bilirubin Urine: NEGATIVE
Glucose, UA: NEGATIVE
Hgb urine dipstick: NEGATIVE
Nitrite: NEGATIVE
Protein, ur: NEGATIVE
Specific Gravity, Urine: 1.023 (ref 1.001–1.035)
pH: 6 (ref 5.0–8.0)

## 2015-05-15 LAB — GC/CHLAMYDIA PROBE AMP
CT Probe RNA: NOT DETECTED
GC Probe RNA: NOT DETECTED

## 2015-05-15 LAB — TSH: TSH: 1.31 mIU/L

## 2015-05-15 LAB — IRON AND TIBC
%SAT: 24 % (ref 11–50)
Iron: 97 ug/dL (ref 40–190)
TIBC: 405 ug/dL (ref 250–450)
UIBC: 308 ug/dL (ref 125–400)

## 2015-05-15 LAB — URINALYSIS, MICROSCOPIC ONLY
Bacteria, UA: NONE SEEN [HPF]
Casts: NONE SEEN [LPF]
Crystals: NONE SEEN [HPF]
Yeast: NONE SEEN [HPF]

## 2015-05-15 LAB — MAGNESIUM: Magnesium: 2 mg/dL (ref 1.5–2.5)

## 2015-05-15 LAB — HSV(HERPES SIMPLEX VRS) I + II AB-IGG
HSV 1 Glycoprotein G Ab, IgG: <0.1 IV
HSV 2 Glycoprotein G Ab, IgG: <0.1 IV

## 2015-05-15 LAB — HEMOGLOBIN A1C
Hgb A1c MFr Bld: 5.5 % (ref <5.7)
Mean Plasma Glucose: 111 mg/dL (ref <117)

## 2015-05-15 LAB — FERRITIN: Ferritin: 13 ng/mL (ref 10–154)

## 2015-05-15 LAB — VITAMIN D 25 HYDROXY (VIT D DEFICIENCY, FRACTURES): Vit D, 25-Hydroxy: 28 ng/mL — ABNORMAL LOW (ref 30–100)

## 2015-05-15 LAB — RPR

## 2015-05-15 LAB — VITAMIN B12: Vitamin B-12: 427 pg/mL (ref 200–1100)

## 2015-05-15 LAB — HIV ANTIBODY (ROUTINE TESTING W REFLEX): HIV 1&2 Ab, 4th Generation: NONREACTIVE

## 2015-05-17 LAB — CYTOLOGY - PAP

## 2015-05-20 DIAGNOSIS — B977 Papillomavirus as the cause of diseases classified elsewhere: Secondary | ICD-10-CM | POA: Insufficient documentation

## 2015-05-20 NOTE — Addendum Note (Signed)
Addended by: Quentin MullingOLLIER, Zniya Cottone R on: 05/20/2015 07:42 AM   Modules accepted: Orders, SmartSet

## 2015-05-22 ENCOUNTER — Telehealth: Payer: Self-pay | Admitting: Obstetrics and Gynecology

## 2015-05-22 NOTE — Telephone Encounter (Signed)
Called and left a message for patient to call back to schedule a new patient doctor referral. °

## 2015-05-28 ENCOUNTER — Encounter: Payer: Self-pay | Admitting: Obstetrics and Gynecology

## 2015-05-28 ENCOUNTER — Ambulatory Visit (INDEPENDENT_AMBULATORY_CARE_PROVIDER_SITE_OTHER): Payer: BLUE CROSS/BLUE SHIELD | Admitting: Obstetrics and Gynecology

## 2015-05-28 VITALS — BP 110/70 | HR 84 | Resp 14 | Ht 63.25 in | Wt 137.0 lb

## 2015-05-28 DIAGNOSIS — B977 Papillomavirus as the cause of diseases classified elsewhere: Secondary | ICD-10-CM | POA: Diagnosis not present

## 2015-05-28 NOTE — Progress Notes (Signed)
Patient ID: Beth Thomas, female   DOB: 1996-02-02, 20 y.o.   MRN: [id number] 20 y.o. [id number] SingleCaucasianF here to discuss abnormal PAP.  The patient is sent for a consultation by Quentin MullingAmanda Collier, PA for evaluation of an abnormal pap. Her pap from last month returned as normal, but she was positive for high risk HPV (negative for 16,18 and 45).  Period Cycle (Days): 28 Period Duration (Days): 5 days  Period Pattern: Regular Menstrual Flow: Moderate Menstrual Control: Tampon, Thin pad Dysmenorrhea: (!) Moderate Dysmenorrhea Symptoms: Cramping  Patient's last menstrual period was 05/09/2015.          Sexually active: not currently- last sexually active in 10/2014   The current method of family planning is NONE (estrogen/progesterone).    Exercising: Yes.    walking Smoker:  no  Health Maintenance: Pap:  05-14-15 NEG POSITIVE HPV History of abnormal Pap:  yes TDaP:  11-15-06 Gardasil: completed all 3    reports that she has been passively smoking.  She has never used smokeless tobacco. She reports that she does not drink alcohol or use illicit drugs.  Past Medical History  Diagnosis Date  . Allergy   . Unspecified vitamin D deficiency     History reviewed. No pertinent past surgical history.  Current Outpatient Prescriptions  Medication Sig Dispense Refill  . Cetirizine HCl (ZYRTEC ALLERGY) 10 MG CAPS Take 10 mg by mouth daily.    . polyethylene glycol powder (GLYCOLAX/MIRALAX) powder TAKE 17 GRAMS DAILY AS DIRECTED BY PRESCRIBER. 527 g 3   No current facility-administered medications for this visit.    Family History  Problem Relation Age of Onset  . Hyperlipidemia Mother   . Hypertension Mother   . Diabetes Mother   . Hypertension Father   . Hyperlipidemia Father     Review of Systems  Constitutional: Negative.   HENT: Negative.   Eyes: Negative.   Respiratory: Negative.   Cardiovascular: Negative.   Gastrointestinal: Negative.   Endocrine: Negative.    Genitourinary: Negative.   Musculoskeletal: Negative.   Skin: Negative.   Allergic/Immunologic: Negative.   Neurological: Negative.   Psychiatric/Behavioral: Negative.     Exam:   BP 110/70 mmHg  Pulse 84  Resp 14  Ht 5' 3.25" (1.607 m)  Wt 137 lb (62.143 kg)  BMI 24.06 kg/m2  LMP 05/09/2015  Weight change: @WEIGHTCHANGE @ Height:   Height: 5' 3.25" (160.7 cm)  Ht Readings from Last 3 Encounters:  05/28/15 5' 3.25" (1.607 m)  05/14/15 5' 4.5" (1.638 m)  12/12/14 5\' 3"  (1.6 m)    General appearance: alert, cooperative and appears stated age  A:  HPV on pap. Pap otherwise normal. No prior abnormal paps.   P:   We discussed the guidelines for pap testing and hpv testing  At her age most women clear hpv viruses very well. It is not recommended to do baseline hpv testing in this age group  Recommend she have a f/u pap with Ms Steffanie DunnCollier next year  CC: Quentin MullingAmanda Collier, [place] Letter sent

## 2016-01-09 ENCOUNTER — Encounter: Payer: Self-pay | Admitting: Physician Assistant

## 2016-01-09 ENCOUNTER — Ambulatory Visit (INDEPENDENT_AMBULATORY_CARE_PROVIDER_SITE_OTHER): Payer: BLUE CROSS/BLUE SHIELD | Admitting: Physician Assistant

## 2016-01-09 VITALS — BP 116/78 | HR 89 | Temp 97.7°F | Resp 14 | Ht 64.5 in | Wt 138.0 lb

## 2016-01-09 DIAGNOSIS — J029 Acute pharyngitis, unspecified: Secondary | ICD-10-CM

## 2016-01-09 LAB — POCT RAPID STREP A (OFFICE): Rapid Strep A Screen: NEGATIVE

## 2016-01-09 MED ORDER — AMOXICILLIN-POT CLAVULANATE 875-125 MG PO TABS: 1.0000 | ORAL_TABLET | Freq: Two times a day (BID) | ORAL | 0 refills | Status: DC

## 2016-01-09 MED ORDER — PREDNISONE 20 MG PO TABS: ORAL_TABLET | ORAL | 0 refills | Status: DC

## 2016-01-09 NOTE — Progress Notes (Signed)
   Subjective:    Patient ID: Beth Thomas, female    DOB: January 16, 1996, 20 y.o.   MRN: [id number]  HPI 20 y.o. WF presents with sore throat x 2 days. No meds for it, no fever, chills. She has had sore throat, swollen glands, exudate, trouble swallowing, slight rhinorrhea but no sinus congestion, cough, SOB, CP.   Blood pressure 116/78, pulse 89, temperature 97.7 F (36.5 C), resp. rate 14, height 5' 4.5" (1.638 m), weight 138 lb (62.6 kg), last menstrual period 12/12/2015, SpO2 98 %.  Review of Systems  Constitutional: Positive for chills. Negative for diaphoresis.  HENT: Positive for congestion, ear pain, sinus pressure and sore throat. Negative for sneezing.   Respiratory: Negative.  Negative for cough and shortness of breath.   Cardiovascular: Negative.   Musculoskeletal: Positive for neck pain.  Neurological: Negative.  Negative for headaches.       Objective:   Physical Exam  Constitutional: She appears well-developed and well-nourished.  HENT:  Head: Normocephalic and atraumatic.  Right Ear: External ear normal.  Left Ear: External ear normal.  Mouth/Throat: Uvula is midline and mucous membranes are normal. No trismus in the jaw. Posterior oropharyngeal edema and posterior oropharyngeal erythema present. No tonsillar abscesses.  Eyes: Conjunctivae and EOM are normal. Pupils are equal, round, and reactive to light.  Neck: Normal range of motion. Neck supple.  Cardiovascular: Normal rate, regular rhythm and normal heart sounds.   Pulmonary/Chest: Effort normal and breath sounds normal.  Abdominal: Soft. Bowel sounds are normal.  Lymphadenopathy:    She has cervical adenopathy.  Skin: Skin is warm and dry.      Assessment & Plan:  1. Sore throat - POCT rapid strep A- negative but not good sample and history of recurrent strep, going to see ENT on Monday, Dr. Ezzard StandingNewman - amoxicillin-clavulanate (AUGMENTIN) 875-125 MG tablet; Take 1 tablet by mouth 2 (two) times daily.   Dispense: 14 tablet; Refill: 0

## 2016-01-09 NOTE — Patient Instructions (Signed)
-  Make sure you are drinking plenty of fluids to stay hydrated.  -while drinking fluids pinch and hold nose close and swallow, to help open eustachian tubes and drain fluid behind ear drums. -you can do salt water gargles. You can also do 1 TSP liquid Maalox and 1 TSP liquid benadryl- mix/ gargle/ spit  If you are not feeling better in 10-14 days, then please call the office.  Pharyngitis Pharyngitis is redness, pain, and swelling (inflammation) of your pharynx.  CAUSES  Pharyngitis is usually caused by infection. Most of the time, these infections are from viruses (viral) and are part of a cold. However, sometimes pharyngitis is caused by bacteria (bacterial). Pharyngitis can also be caused by allergies. Viral pharyngitis may be spread from person to person by coughing, sneezing, and personal items or utensils (cups, forks, spoons, toothbrushes). Bacterial pharyngitis may be spread from person to person by more intimate contact, such as kissing.  SIGNS AND SYMPTOMS  Symptoms of pharyngitis include:   Sore throat.   Tiredness (fatigue).   Low-grade fever.   Headache.  Joint pain and muscle aches.  Skin rashes.  Swollen lymph nodes.  Plaque-like film on throat or tonsils (often seen with bacterial pharyngitis). DIAGNOSIS  Your health care provider will ask you questions about your illness and your symptoms. Your medical history, along with a physical exam, is often all that is needed to diagnose pharyngitis. Sometimes, a rapid strep test is done. Other lab tests may also be done, depending on the suspected cause.  TREATMENT  Viral pharyngitis will usually get better in 3-4 days without the use of medicine. Bacterial pharyngitis is treated with medicines that kill germs (antibiotics).  HOME CARE INSTRUCTIONS   Drink enough water and fluids to keep your urine clear or pale yellow.   Only take over-the-counter or prescription medicines as directed by your health care  provider:   If you are prescribed antibiotics, make sure you finish them even if you start to feel better.   Do not take aspirin.   Get lots of rest.   Gargle with 8 oz of salt water ( tsp of salt per 1 qt of water) as often as every 1-2 hours to soothe your throat.   Throat lozenges (if you are not at risk for choking) or sprays may be used to soothe your throat. SEEK MEDICAL CARE IF:   You have large, tender lumps in your neck.  You have a rash.  You cough up green, yellow-brown, or bloody spit. SEEK IMMEDIATE MEDICAL CARE IF:   Your neck becomes stiff.  You drool or are unable to swallow liquids.  You vomit or are unable to keep medicines or liquids down.  You have severe pain that does not go away with the use of recommended medicines.  You have trouble breathing (not caused by a stuffy nose). MAKE SURE YOU:   Understand these instructions.  Will watch your condition.  Will get help right away if you are not doing well or get worse. Document Released: 03/02/2005 Document Revised: 12/21/2012 Document Reviewed: 11/07/2012 ExitCare Patient Information 2015 [employer]. This information is not intended to replace advice given to you by your health care provider. Make sure you discuss any questions you have with your health care provider.  

## 2016-01-15 HISTORY — PX: TONSILECTOMY/ADENOIDECTOMY WITH MYRINGOTOMY: SHX6125

## 2016-05-12 ENCOUNTER — Other Ambulatory Visit: Payer: Self-pay | Admitting: Physician Assistant

## 2016-05-18 ENCOUNTER — Encounter: Payer: Self-pay | Admitting: Physician Assistant

## 2016-05-28 ENCOUNTER — Ambulatory Visit: Payer: BLUE CROSS/BLUE SHIELD | Admitting: Obstetrics and Gynecology

## 2016-06-04 ENCOUNTER — Encounter: Payer: Self-pay | Admitting: Physician Assistant

## 2016-06-04 ENCOUNTER — Ambulatory Visit (INDEPENDENT_AMBULATORY_CARE_PROVIDER_SITE_OTHER): Payer: BLUE CROSS/BLUE SHIELD | Admitting: Physician Assistant

## 2016-06-04 ENCOUNTER — Other Ambulatory Visit (HOSPITAL_COMMUNITY)
Admission: RE | Admit: 2016-06-04 | Discharge: 2016-06-04 | Disposition: A | Discharge status: Home / Self Care | Payer: BLUE CROSS/BLUE SHIELD | Source: Ambulatory Visit | Attending: Internal Medicine | Admitting: Internal Medicine

## 2016-06-04 VITALS — BP 120/80 | HR 80 | Temp 97.9°F | Resp 16 | Ht 64.5 in | Wt 167.8 lb

## 2016-06-04 DIAGNOSIS — Z79899 Other long term (current) drug therapy: Secondary | ICD-10-CM

## 2016-06-04 DIAGNOSIS — Z23 Encounter for immunization: Secondary | ICD-10-CM | POA: Diagnosis not present

## 2016-06-04 DIAGNOSIS — R6889 Other general symptoms and signs: Secondary | ICD-10-CM | POA: Diagnosis not present

## 2016-06-04 DIAGNOSIS — E559 Vitamin D deficiency, unspecified: Secondary | ICD-10-CM | POA: Diagnosis not present

## 2016-06-04 DIAGNOSIS — D649 Anemia, unspecified: Secondary | ICD-10-CM

## 2016-06-04 DIAGNOSIS — Z0001 Encounter for general adult medical examination with abnormal findings: Secondary | ICD-10-CM | POA: Diagnosis not present

## 2016-06-04 DIAGNOSIS — Z0000 Encounter for general adult medical examination without abnormal findings: Secondary | ICD-10-CM

## 2016-06-04 DIAGNOSIS — R7309 Other abnormal glucose: Secondary | ICD-10-CM

## 2016-06-04 DIAGNOSIS — Z01419 Encounter for gynecological examination (general) (routine) without abnormal findings: Secondary | ICD-10-CM | POA: Insufficient documentation

## 2016-06-04 DIAGNOSIS — Z1389 Encounter for screening for other disorder: Secondary | ICD-10-CM

## 2016-06-04 DIAGNOSIS — Z113 Encounter for screening for infections with a predominantly sexual mode of transmission: Secondary | ICD-10-CM | POA: Diagnosis not present

## 2016-06-04 DIAGNOSIS — Z13220 Encounter for screening for lipoid disorders: Secondary | ICD-10-CM

## 2016-06-04 DIAGNOSIS — Z124 Encounter for screening for malignant neoplasm of cervix: Secondary | ICD-10-CM

## 2016-06-04 DIAGNOSIS — Z30011 Encounter for initial prescription of contraceptive pills: Secondary | ICD-10-CM

## 2016-06-04 DIAGNOSIS — N898 Other specified noninflammatory disorders of vagina: Secondary | ICD-10-CM

## 2016-06-04 LAB — CBC WITH DIFFERENTIAL/PLATELET
Basophils Absolute: 65 cells/uL (ref 0–200)
Basophils Relative: 1 %
Eosinophils Absolute: 260 cells/uL (ref 15–500)
Eosinophils Relative: 4 %
HCT: 40.3 % (ref 35.0–45.0)
Hemoglobin: 13.2 g/dL (ref 11.7–15.5)
Lymphocytes Relative: 36 %
Lymphs Abs: 2340 cells/uL (ref 850–3900)
MCH: 29.1 pg (ref 27.0–33.0)
MCHC: 32.8 g/dL (ref 32.0–36.0)
MCV: 88.8 fL (ref 80.0–100.0)
MPV: 10.9 fL (ref 7.5–12.5)
Monocytes Absolute: 585 cells/uL (ref 200–950)
Monocytes Relative: 9 %
Neutro Abs: 3250 cells/uL (ref 1500–7800)
Neutrophils Relative %: 50 %
Platelets: 237 10*3/uL (ref 140–400)
RBC: 4.54 MIL/uL (ref 3.80–5.10)
RDW: 14.1 % (ref 11.0–15.0)
WBC: 6.5 10*3/uL (ref 3.8–10.8)

## 2016-06-04 LAB — IRON AND TIBC
%SAT: 11 % (ref 11–50)
Iron: 48 ug/dL (ref 40–190)
TIBC: 419 ug/dL (ref 250–450)
UIBC: 371 ug/dL (ref 125–400)

## 2016-06-04 LAB — LIPID PANEL
Cholesterol: 162 mg/dL (ref <200)
HDL: 33 mg/dL — ABNORMAL LOW (ref >50)
LDL Cholesterol: 112 mg/dL — ABNORMAL HIGH (ref <100)
Total CHOL/HDL Ratio: 4.9 Ratio (ref <5.0)
Triglycerides: 87 mg/dL (ref <150)
VLDL: 17 mg/dL (ref <30)

## 2016-06-04 LAB — BASIC METABOLIC PANEL WITH GFR
BUN: 10 mg/dL (ref 7–25)
CO2: 27 mmol/L (ref 20–31)
Calcium: 9.1 mg/dL (ref 8.6–10.2)
Chloride: 104 mmol/L (ref 98–110)
Creat: 0.69 mg/dL (ref 0.50–1.10)
GFR, Est African American: >89 mL/min (ref >=60)
GFR, Est Non African American: >89 mL/min (ref >=60)
Glucose, Bld: 83 mg/dL (ref 65–99)
Potassium: 3.8 mmol/L (ref 3.5–5.3)
Sodium: 139 mmol/L (ref 135–146)

## 2016-06-04 LAB — HEPATIC FUNCTION PANEL
ALT: 11 U/L (ref 6–29)
AST: 19 U/L (ref 10–30)
Albumin: 4.3 g/dL (ref 3.6–5.1)
Alkaline Phosphatase: 40 U/L (ref 33–115)
Bilirubin, Direct: 0.1 mg/dL (ref <=0.2)
Indirect Bilirubin: 0.3 mg/dL (ref 0.2–1.2)
Total Bilirubin: 0.4 mg/dL (ref 0.2–1.2)
Total Protein: 7.3 g/dL (ref 6.1–8.1)

## 2016-06-04 LAB — HEMOGLOBIN A1C
Hgb A1c MFr Bld: 5 % (ref <5.7)
Mean Plasma Glucose: 97 mg/dL

## 2016-06-04 LAB — TSH: TSH: 2.39 mIU/L

## 2016-06-04 LAB — VITAMIN B12: Vitamin B-12: 420 pg/mL (ref 200–1100)

## 2016-06-04 LAB — FERRITIN: Ferritin: 9 ng/mL — ABNORMAL LOW (ref 10–154)

## 2016-06-04 MED ORDER — DROSPIRENONE-ETHINYL ESTRADIOL 3-0.03 MG PO TABS: 1.0000 | ORAL_TABLET | Freq: Every day | ORAL | 4 refills | Status: DC

## 2016-06-04 NOTE — Progress Notes (Signed)
Complete Physical  Assessment and Plan: Allergic rhinitis, unspecified allergic rhinitis type  Vitamin D deficiency - VITAMIN D 25 Hydroxy (Vit-D Deficiency, Fractures)  Other abnormal glucose - TSH - Hemoglobin A1c - EKG 12-Lead  Screening cholesterol level - Lipid panel   Screening for blood or protein in urine - Urinalysis, Routine w reflex microscopic (not at [facility]) - Microalbumin / creatinine urine ratio  Screening for cervical cancer - Cytology - PAP   Medication management - CBC with Differential/Platelet - BASIC METABOLIC PANEL WITH GFR - Hepatic function panel - Magnesium  Routine general medical examination at a health care facility Health Maintenance- Discussed STD testing, safe sex, alcohol and drug awareness, drinking and driving dangers, wearing a seat belt and general safety measures for young adult. - Cytology - PAP  Anemia, unspecified anemia type - Iron and TIBC - Ferritin - Vitamin B12  Screen for STD (sexually transmitted disease) - GC/Chlamydia Probe Amp - RPR - HIV antibody - HSV(herpes simplex vrs) 1+2 ab-IgG  Encounter for initial prescription of contraceptive pills -     drospirenone-ethinyl estradiol (OCELLA) 3-0.03 MG tablet; Take 1 tablet by mouth daily.  Vaginal discharge -     TSH -     WET PREP BY MOLECULAR PROBE  Other orders -     Tdap vaccine greater than or equal to 7yo IM  Discussed med's effects and SE's. Screening labs and tests as requested with regular follow-up as recommended. Over 40 minutes of exam, counseling, chart review and critical decision making was performed  HPI  This very nice 20 y.o.female presents for complete physical.  Patient has no major health issues.  Patient reports no complaints at this time.  She does workout.  Finally, patient has history of Vitamin D Deficiency, not on medication.  She has been dating neighbor, known x 10 years, has dating x 4 months, took plan B 1 week ago, has had some  bleeding since that time, negative preg test at home.   Current Medications:  Current Outpatient Prescriptions on File Prior to Visit  Medication Sig Dispense Refill  . Cetirizine HCl (ZYRTEC ALLERGY) 10 MG CAPS Take 10 mg by mouth daily.    . polyethylene glycol powder (GLYCOLAX/MIRALAX) powder TAKE 17 GRAMS BY MOUTH AS DIRECTED BY PRESCRIBER 850 g 1   No current facility-administered medications on file prior to visit.    Health Maintenance:   Immunization History  Administered Date(s) Administered  . HPV 9-valent 10/10/2014, 12/12/2014, 05/14/2015  . Tdap 11/15/2006   TD/TDAP: 2008 DUE Influenza: declines Pneumovax: N/a Prevnar 13: N/a HPV vaccine  completed  LMP: Patient's last menstrual period was 05/13/2016. Sexually Active: new partner, + sexually active, would like to get back on BCP, will get STD testing today Pap: normal, will check yearly until 28 MGM:  US left breast Last Dental Exam: Last Eye Exam:  Medical History:  Past Medical History:  Diagnosis Date  . Allergy   . Unspecified vitamin D deficiency    Allergies No Known Allergies  SURGICAL HISTORY She  has a past surgical history that includes Tonsilectomy/adenoidectomy with myringotomy (01/2016). FAMILY HISTORY Her family history includes Diabetes in her mother; Hyperlipidemia in her father and mother; Hypertension in her father and mother. SOCIAL HISTORY She  reports that she is a non-smoker but has been exposed to tobacco smoke. She has never used smokeless tobacco. She reports that she does not drink alcohol or use drugs.  Review of Systems: Review of Systems  Constitutional: Negative.  HENT: Negative.   Eyes: Negative.   Respiratory: Negative.   Cardiovascular: Negative.   Gastrointestinal: Negative.   Genitourinary: Negative.   Musculoskeletal: Negative.   Skin: Negative.   Neurological: Negative.   Endo/Heme/Allergies: Negative.   Psychiatric/Behavioral: Negative.     Physical  Exam: Estimated body mass index is 28.36 kg/m as calculated from the following:   Height as of this encounter: 5' 4.5" (1.638 m).   Weight as of this encounter: 167 lb 12.8 oz (76.1 kg). BP 120/80   Pulse 80   Temp 97.9 F (36.6 C)   Resp 16   Ht 5' 4.5" (1.638 m)   Wt 167 lb 12.8 oz (76.1 kg)   LMP 05/13/2016   SpO2 97%   BMI 28.36 kg/m  General Appearance: Well nourished, in no apparent distress.  Eyes: PERRLA, EOMs, conjunctiva no swelling or erythema, normal fundi and vessels.  Sinuses: No Frontal/maxillary tenderness  ENT/Mouth: Ext aud canals clear, normal light reflex with TMs without erythema, bulging. Good dentition. No erythema, swelling, or exudate on post pharynx. Tonsils not swollen or erythematous. Hearing normal.  Neck: Supple, thyroid normal. No bruits  Respiratory: Respiratory effort normal, BS equal bilaterally without rales, rhonchi, wheezing or stridor.  Cardio: RRR without murmurs, rubs or gallops. Brisk peripheral pulses without edema.  Chest: symmetric, with normal excursions and percussion.  Breasts: Symmetric, without lumps, nipple discharge, retractions.  Abdomen: Soft, nontender, no guarding, rebound, hernias, masses, or organomegaly.  Lymphatics: Non tender without lymphadenopathy.  Genitourinary: normal external genitalia, vulva, vagina, cervix, uterus and adnexa, PAP: Pap smear done today. Musculoskeletal: Full ROM all peripheral extremities,5/5 strength, and normal gait.  Skin: Warm, dry without rashes, lesions, ecchymosis. Neuro: Cranial nerves intact, reflexes equal bilaterally. Normal muscle tone, no cerebellar symptoms. Sensation intact.  Psych: Awake and oriented X 3, normal affect, Insight and Judgment appropriate.   EKG: defer  Quentin Mulling 10:15 AM [facility]

## 2016-06-05 LAB — VITAMIN D 25 HYDROXY (VIT D DEFICIENCY, FRACTURES): Vit D, 25-Hydroxy: 26 ng/mL — ABNORMAL LOW (ref 30–100)

## 2016-06-05 LAB — MAGNESIUM: Magnesium: 2.1 mg/dL (ref 1.5–2.5)

## 2016-06-05 LAB — URINALYSIS, ROUTINE W REFLEX MICROSCOPIC
Bilirubin Urine: NEGATIVE
Glucose, UA: NEGATIVE
Ketones, ur: NEGATIVE
Leukocytes, UA: NEGATIVE
Nitrite: NEGATIVE
Protein, ur: NEGATIVE
Specific Gravity, Urine: 1.02 (ref 1.001–1.035)
pH: 7.5 (ref 5.0–8.0)

## 2016-06-05 LAB — URINALYSIS, MICROSCOPIC ONLY
Bacteria, UA: NONE SEEN [HPF]
Casts: NONE SEEN [LPF]
Crystals: NONE SEEN [HPF]
WBC, UA: NONE SEEN WBC/HPF (ref <=5)
Yeast: NONE SEEN [HPF]

## 2016-06-05 LAB — GC/CHLAMYDIA PROBE AMP
CT Probe RNA: NOT DETECTED
GC Probe RNA: NOT DETECTED

## 2016-06-05 LAB — RPR

## 2016-06-05 LAB — MICROALBUMIN / CREATININE URINE RATIO
Creatinine, Urine: 159 mg/dL (ref 20–320)
Microalb Creat Ratio: 6 mcg/mg creat (ref <30)
Microalb, Ur: 0.9 mg/dL

## 2016-06-05 LAB — WET PREP BY MOLECULAR PROBE
Candida species: NOT DETECTED
Gardnerella vaginalis: NOT DETECTED
Trichomonas vaginosis: NOT DETECTED

## 2016-06-05 LAB — HIV ANTIBODY (ROUTINE TESTING W REFLEX): HIV 1&2 Ab, 4th Generation: NONREACTIVE

## 2016-06-05 LAB — HSV(HERPES SIMPLEX VRS) I + II AB-IGG
HSV 1 Glycoprotein G Ab, IgG: <0.9 Index (ref <0.90)
HSV 2 Glycoprotein G Ab, IgG: <0.9 Index (ref <0.90)

## 2016-06-08 LAB — CYTOLOGY - PAP: Diagnosis: NEGATIVE

## 2016-06-08 NOTE — Progress Notes (Signed)
Pt was made aware of lab results.   

## 2016-06-09 NOTE — Progress Notes (Signed)
Pt aware of lab results & voiced understanding of those results.

## 2016-06-17 ENCOUNTER — Ambulatory Visit (INDEPENDENT_AMBULATORY_CARE_PROVIDER_SITE_OTHER): Payer: BLUE CROSS/BLUE SHIELD

## 2016-06-17 ENCOUNTER — Ambulatory Visit (INDEPENDENT_AMBULATORY_CARE_PROVIDER_SITE_OTHER): Payer: BLUE CROSS/BLUE SHIELD | Admitting: Physician Assistant

## 2016-06-17 DIAGNOSIS — M25532 Pain in left wrist: Secondary | ICD-10-CM

## 2016-06-17 DIAGNOSIS — S63502A Unspecified sprain of left wrist, initial encounter: Secondary | ICD-10-CM | POA: Diagnosis not present

## 2016-06-17 NOTE — Progress Notes (Signed)
   Office Visit Note   Patient: Beth Thomas           Date of Birth: [birth year]           MRN: [id number] Visit Date: 06/17/2016              Requested by: Lucky Cowboy, MD [address] [place], [place] [address] PCP: Beth Corwin, MD   Assessment & Plan: Visit Diagnoses:  1. Pain in left wrist   2. Sprain of left wrist, initial encounter     Plan: Follow up if pain becomes problematic again or returns.  Discontinue wrist brace activities as tolerated.  Follow-Up Instructions: Return if symptoms worsen or fail to improve.   Orders:  Orders Placed This Encounter  Procedures  . XR Wrist Complete Left   No orders of the defined types were placed in this encounter.     Procedures: No procedures performed   Clinical Data: No additional findings.   Subjective: Left wrist pain  HPI Beth Thomas is a 21 year old female known to Dr. Magnus Ivan comes in today with new complaint of left wrist pain  Past 2-3 weeks. She reports no particular injury. 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 She is right-hand dominant. 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 She's tried a wrist splint and feels overall that her wrist pain is becoming less. She's having no awakening pain due to due to the wrist. He denies any numbness or tingling down either arm or in either hand. She's tried no other treatments other than the wrist splint which seemed to help. Review of Systems No numbness, tingling in either upper extremity. No rashes skin lesions ulcerations, fevers or chills.  Objective: Vital Signs: There were no vitals taken for this visit.  Physical Exam Well-developed well-nourished female in no acute distress. Ortho Exam Left wrist she has excellent range of motion  without pain. She has no tenderness about the wrist or the hand with palpation throughout.  Ulnar deviation are all radial deviation causes no pain. No rashes skin lesions ulcerations or edema of either wrist. Radial pulses are 2+ bilaterally. She has full motor of both hands. Specialty Comments:  No specialty comments available.  Imaging: Xr Wrist Complete Left  Result Date: 06/17/2016 Left wrist 3 views: No acute fracture no bony abnormalities. Carpal bones are well aligned.    PMFS History: Patient Active Problem List   Diagnosis Date Noted  . HPV test positive 05/20/2015  . Vitamin D deficiency 05/14/2015  . Other abnormal glucose 05/14/2015  . Allergic rhinitis 01/26/2013   Past Medical History:  Diagnosis Date  . Allergy   . Unspecified vitamin D deficiency     Family History  Problem Relation Age of Onset  . Hyperlipidemia Mother   . Hypertension Mother   . Diabetes Mother   . Hypertension Father   . Hyperlipidemia Father     Past Surgical History:  Procedure Laterality Date  . TONSILECTOMY/ADENOIDECTOMY WITH MYRINGOTOMY  01/2016   Social History   Occupational History  . Not on file.   Social History Main Topics  . Smoking status: Passive Smoke Exposure - Never Smoker  . Smokeless tobacco: Never Used  . Alcohol use No  . Drug use: No  . Sexual activity: Not Currently    Partners: Male

## 2016-06-18 ENCOUNTER — Ambulatory Visit: Payer: BLUE CROSS/BLUE SHIELD | Admitting: Obstetrics and Gynecology

## 2016-07-16 ENCOUNTER — Encounter (INDEPENDENT_AMBULATORY_CARE_PROVIDER_SITE_OTHER): Payer: BLUE CROSS/BLUE SHIELD | Admitting: Obstetrics and Gynecology

## 2016-07-16 ENCOUNTER — Encounter: Payer: Self-pay | Admitting: Obstetrics and Gynecology

## 2016-07-16 NOTE — Progress Notes (Deleted)
21 y.o. G0P0000 SingleCaucasianF here for annual exam.   She had a normal pap with +HPV with her primary last year, negative for 16/18.  Period Cycle (Days): 28 Period Duration (Days): 5 days  Period Pattern: Regular Menstrual Flow: Moderate Menstrual Control: Maxi pad, Tampon Menstrual Control Change Freq (Hours): changes tampon every 4 hours  Dysmenorrhea: (!) Moderate Dysmenorrhea Symptoms: Cramping  Patient's last menstrual period was 07/02/2016.          Sexually active: Yes.    The current method of family planning is OCP (estrogen/progesterone).    Exercising: No.  The patient does not participate in regular exercise at present. Smoker:  no  Health Maintenance: Pap:  06-04-16 WNL 05-14-15 WNL + HR HPV History of abnormal Pap:  Yes 2017 +HR HPV TDaP:  06-04-16 Gardasil: completed all 3    reports that she is a non-smoker but has been exposed to tobacco smoke. She has never used smokeless tobacco. She reports that she does not drink alcohol or use drugs.  Past Medical History:  Diagnosis Date  . Allergy   . Unspecified vitamin D deficiency     Past Surgical History:  Procedure Laterality Date  . TONSILECTOMY/ADENOIDECTOMY WITH MYRINGOTOMY  01/2016    Current Outpatient Prescriptions  Medication Sig Dispense Refill  . Cetirizine HCl (ZYRTEC ALLERGY) 10 MG CAPS Take 10 mg by mouth daily.    . drospirenone-ethinyl estradiol (OCELLA) 3-0.03 MG tablet Take 1 tablet by mouth daily. 3 Package 4  . polyethylene glycol powder (GLYCOLAX/MIRALAX) powder TAKE 17 GRAMS BY MOUTH AS DIRECTED BY PRESCRIBER 850 g 1   No current facility-administered medications for this visit.     Family History  Problem Relation Age of Onset  . Hyperlipidemia Mother   . Hypertension Mother   . Diabetes Mother   . Hypertension Father   . Hyperlipidemia Father     Review of Systems  Constitutional: Negative.   HENT: Negative.   Eyes: Negative.   Respiratory: Negative.   Cardiovascular:  Negative.   Gastrointestinal: Negative.   Endocrine: Negative.   Genitourinary: Negative.   Musculoskeletal: Negative.   Skin: Negative.   Allergic/Immunologic: Negative.   Neurological: Negative.   Psychiatric/Behavioral: Negative.     Exam:   BP 120/80 (BP Location: Right Arm, Patient Position: Sitting, Cuff Size: Normal)   Pulse 68   Resp 12   Ht 5' 3.25" (1.607 m)   Wt 163 lb (73.9 kg)   LMP 07/02/2016   BMI 28.65 kg/m   Weight change: @WEIGHTCHANGE @ Height:   Height: 5' 3.25" (160.7 cm)  Ht Readings from Last 3 Encounters:  07/16/16 5' 3.25" (1.607 m)  06/04/16 5' 4.5" (1.638 m)  01/09/16 5' 4.5" (1.638 m)    General appearance: alert, cooperative and appears stated age Head: Normocephalic, without obvious abnormality, atraumatic Neck: no adenopathy, supple, symmetrical, trachea midline and thyroid {CHL AMB PHY EX THYROID NORM DEFAULT:[phone]::"normal to inspection and palpation"} Lungs: clear to auscultation bilaterally Cardiovascular: regular rate and rhythm Breasts: {Exam; breast:13139::"normal appearance, no masses or tenderness"} Abdomen: soft, non-tender; bowel sounds normal; no masses,  no organomegaly Extremities: extremities normal, atraumatic, no cyanosis or edema Skin: Skin color, texture, turgor normal. No rashes or lesions Lymph nodes: Cervical, supraclavicular, and axillary nodes normal. No abnormal inguinal nodes palpated Neurologic: Grossly normal   Pelvic: External genitalia:  no lesions              Urethra:  normal appearing urethra with no masses, tenderness or lesions  Bartholins and Skenes: normal                 Vagina: normal appearing vagina with normal color and discharge, no lesions              Cervix: {CHL AMB PHY EX CERVIX NORM DEFAULT:[phone]::"no lesions"}               Bimanual Exam:  Uterus:  {CHL AMB PHY EX UTERUS NORM DEFAULT:[phone]::"normal size, contour, position, consistency, mobility, non-tender"}               Adnexa: {CHL AMB PHY EX ADNEXA NO MASS DEFAULT:[phone]::"no mass, fullness, tenderness"}               Rectovaginal: Confirms               Anus:  normal sphincter tone, no lesions  Chaperone was present for exam.  A:  Well Woman with normal exam  P:

## 2016-07-16 NOTE — Progress Notes (Signed)
error 

## 2016-09-28 ENCOUNTER — Ambulatory Visit (INDEPENDENT_AMBULATORY_CARE_PROVIDER_SITE_OTHER): Payer: BLUE CROSS/BLUE SHIELD | Admitting: Physician Assistant

## 2016-09-28 ENCOUNTER — Encounter: Payer: Self-pay | Admitting: Physician Assistant

## 2016-09-28 VITALS — BP 118/70 | HR 68 | Temp 97.6°F | Resp 14 | Ht 64.5 in | Wt 160.0 lb

## 2016-09-28 DIAGNOSIS — D649 Anemia, unspecified: Secondary | ICD-10-CM

## 2016-09-28 DIAGNOSIS — E559 Vitamin D deficiency, unspecified: Secondary | ICD-10-CM

## 2016-09-28 DIAGNOSIS — F419 Anxiety disorder, unspecified: Secondary | ICD-10-CM | POA: Diagnosis not present

## 2016-09-28 MED ORDER — CITALOPRAM HYDROBROMIDE 20 MG PO TABS: 20.0000 mg | ORAL_TABLET | Freq: Every day | ORAL | 2 refills | Status: DC

## 2016-09-28 NOTE — Patient Instructions (Addendum)
Look into cognitive behavioral therapy  Start on celexa 10mg  daily can go up to 20 mg if you want or stay on the 10 mg Can take up to 2 weeks to start helping  We want you on at least 5000 IU daily  Vitamin D goal is between 60-80  Please make sure that you are taking your Vitamin D as directed.   It is very important as a natural anti-inflammatory   helping hair, skin, and nails, as well as reducing stroke and heart attack risk.   It helps your bones and helps with mood.  It also decreases numerous cancer risks so please take it as directed.   Low Vit D is associated with a 200-300% higher risk for CANCER   and 200-300% higher risk for HEART   ATTACK  &  STROKE.    .....................................Marland Kitchen  It is also associated with higher death rate at younger ages,   autoimmune diseases like Rheumatoid arthritis, Lupus, Multiple Sclerosis.     Also many other serious conditions, like depression, Alzheimer's  Dementia, infertility, muscle aches, fatigue, fibromyalgia - just to name a few.  +++++++++++++++++++  Can get liquid vitamin D from [place]  OR here in [place] at  [facility] alternatives [address], [place], [place] [address] Or you can try earth fare    Iron Deficiency Anemia, Adult Iron-deficiency anemia is when you have a low amount of red blood cells or hemoglobin. This happens because you have too little iron in your body. Hemoglobin carries oxygen to parts of the body. Anemia can cause your body to not get enough oxygen. It may or may not cause symptoms. Follow these instructions at home: Medicines  Take over-the-counter and prescription medicines only as told by your doctor. This includes iron pills (supplements) and vitamins.  If you cannot handle taking iron pills by mouth, ask your doctor about getting iron through: ? A vein (intravenously). ? A shot (injection) into a muscle.  Take iron pills when your stomach is empty. If you cannot handle this,  take them with food.  Do not drink milk or take antacids at the same time as your iron pills.  To prevent trouble pooping (constipation), eat fiber or take medicine (stool softener) as told by your doctor. Eating and drinking  Talk with your doctor before changing the foods you eat. He or she may tell you to eat foods that have a lot of iron, such as: ? Liver. ? Lowfat (lean) beef. ? Breads and cereals that have iron added to them (fortified breads and cereals). ? Eggs. ? Dried fruit. ? Dark green, leafy vegetables.  Drink enough fluid to keep your pee (urine) clear or pale yellow.  Eat fresh fruits and vegetables that are high in vitamin C. They help your body to use iron. Foods with a lot of vitamin C include: ? Oranges. ? Peppers. ? Tomatoes. ? Mangoes. General instructions  Return to your normal activities as told by your doctor. Ask your doctor what activities are safe for you.  Keep yourself clean, and keep things clean around you (your surroundings). Anemia can make you get sick more easily.  Keep all follow-up visits as told by your doctor. This is important. Contact a doctor if:  You feel sick to your stomach (nauseous).  You throw up (vomit).  You feel weak.  You are sweating for no clear reason.  You have trouble pooping, such as: ? Pooping (having a bowel movement) less than 3 times a  week. ? Straining to poop. ? Having poop that is hard, dry, or larger than normal. ? Feeling full or bloated. ? Pain in the lower belly. ? Not feeling better after pooping. Get help right away if:  You pass out (faint). If this happens, do not drive yourself to the hospital. Call your local emergency services (911 in the [place]  You have chest pain.  You have shortness of breath that: ? Is very bad. ? Gets worse with physical activity.  You have a fast heartbeat.  You get light-headed when getting up from sitting or lying down. This information is not intended to  replace advice given to you by your health care provider. Make sure you discuss any questions you have with your health care provider. Document Released: 04/04/2010 Document Revised: 11/20/2015 Document Reviewed: 11/20/2015 Elsevier Interactive Patient Education  2017 [employer]vier Inc.   Major Depressive Disorder, Adult Major depressive disorder (MDD) is a mental health condition. It may also be called clinical depression or unipolar depression. MDD usually causes feelings of sadness, hopelessness, or helplessness. MDD can also cause physical symptoms. It can interfere with work, school, relationships, and other everyday activities. MDD may be mild, moderate, or severe. It may occur once (single episode major depressive disorder) or it may occur multiple times (recurrent major depressive disorder). What are the causes? The exact cause of this condition is not known. MDD is most likely caused by a combination of things, which may include:  Genetic factors. These are traits that are passed along from parent to child.  Individual factors. Your personality, your behavior, and the way you handle your thoughts and feelings may contribute to MDD. This includes personality traits and behaviors learned from others.  Physical factors, such as: ? Differences in the part of your brain that controls emotion. This part of your brain may be different than it is in people who do not have MDD. ? Long-term (chronic) medical or psychiatric illnesses.  Social factors. Traumatic experiences or major life changes may play a role in the development of MDD.  What increases the risk? This condition is more likely to develop in women. The following factors may also make you more likely to develop MDD:  A family history of depression.  Troubled family relationships.  Abnormally low levels of certain brain chemicals.  Traumatic events in childhood, especially abuse or the loss of a parent.  Being under a lot of  stress, or long-term stress, especially from upsetting life experiences or losses.  A history of: ? Chronic physical illness. ? Other mental health disorders. ? Substance abuse.  Poor living conditions.  Experiencing social exclusion or discrimination on a regular basis.  What are the signs or symptoms? The main symptoms of MDD typically include:  Constant depressed or irritable mood.  Loss of interest in things and activities.  MDD symptoms may also include:  Sleeping or eating too much or too little.  Unexplained weight change.  Fatigue or low energy.  Feelings of worthlessness or guilt.  Difficulty thinking clearly or making decisions.  Thoughts of suicide or of harming others.  Physical agitation or weakness.  Isolation.  Severe cases of MDD may also occur with other symptoms, such as:  Delusions or hallucinations, in which you imagine things that are not real (psychotic depression).  Low-level depression that lasts at least a year (chronic depression or persistent depressive disorder).  Extreme sadness and hopelessness (melancholic depression).  Trouble speaking and moving (catatonic depression).  How is this  diagnosed? This condition may be diagnosed based on:  Your symptoms.  Your medical history, including your mental health history. This may involve tests to evaluate your mental health. You may be asked questions about your lifestyle, including any drug and alcohol use, and how long you have had symptoms of MDD.  A physical exam.  Blood tests to rule out other conditions.  You must have a depressed mood and at least four other MDD symptoms most of the day, nearly every day in the same 2-week timeframe before your health care provider can confirm a diagnosis of MDD. How is this treated? This condition is usually treated by mental health professionals, such as psychologists, psychiatrists, and clinical social workers. You may need more than one type  of treatment. Treatment may include:  Psychotherapy. This is also called talk therapy or counseling. Types of psychotherapy include: ? Cognitive behavioral therapy (CBT). This type of therapy teaches you to recognize unhealthy feelings, thoughts, and behaviors, and replace them with positive thoughts and actions. ? Interpersonal therapy (IPT). This helps you to improve the way you relate to and communicate with others. ? Family therapy. This treatment includes members of your family.  Medicine to treat anxiety and depression, or to help you control certain emotions and behaviors.  Lifestyle changes, such as: ? Limiting alcohol and drug use. ? Exercising regularly. ? Getting plenty of sleep. ? Making healthy eating choices. ? Spending more time outdoors.  Treatments involving stimulation of the brain can be used in situations with extremely severe symptoms, or when medicine or other therapies do not work over time. These treatments include electroconvulsive therapy, transcranial magnetic stimulation, and vagal nerve stimulation. Follow these instructions at home: Activity  Return to your normal activities as told by your health care provider.  Exercise regularly and spend time outdoors as told by your health care provider. General instructions  Take over-the-counter and prescription medicines only as told by your health care provider.  Do not drink alcohol. If you drink alcohol, limit your alcohol intake to no more than 1 drink a day for nonpregnant women and 2 drinks a day for men. One drink equals 12 oz of beer, 5 oz of wine, or 1 oz of hard liquor. Alcohol can affect any antidepressant medicines you are taking. Talk to your health care provider about your alcohol use.  Eat a healthy diet and get plenty of sleep.  Find activities that you enjoy doing, and make time to do them.  Consider joining a support group. Your health care provider may be able to recommend a support  group.  Keep all follow-up visits as told by your health care provider. This is important. Where to find more information: [employer] on Mental Illness  www.nami.org  [place] of Mental Health  http://www.maynard.net/  National Suicide Prevention Lifeline  1-800-273-TALK 715-177-2338). This is free, 24-hour help.  Contact a health care provider if:  Your symptoms get worse.  You develop new symptoms. Get help right away if:  You self-harm.  You have serious thoughts about hurting yourself or others.  You see, hear, taste, smell, or feel things that are not present (hallucinate). This information is not intended to replace advice given to you by your health care provider. Make sure you discuss any questions you have with your health care provider. Document Released: 06/27/2012 Document Revised: 11/07/2015 Document Reviewed: 09/11/2015 Elsevier Interactive Patient Education  2017 [employer].

## 2016-09-28 NOTE — Progress Notes (Signed)
   Subjective:    Patient ID: Beth Thomas, female    DOB: July 11, 1995, 21 y.o.   MRN: [id number]  HPI 21 y.o. WF presents with depression x 1 week.   States has a lot of issues at her house, family stuff, she is no longer dating a guy, will wait. She has been crying off and on, decreased appetite, decreased motivation. No thoughts of suicide/harming self. Has had depression in the past, never treated. Never seen a Veterinary surgeoncounselor.   Blood pressure 118/70, pulse 68, temperature 97.6 F (36.4 C), resp. rate 14, height 5' 4.5" (1.638 m), weight 160 lb (72.6 kg), last menstrual period 09/25/2016, SpO2 98 %.  Medications Current Outpatient Prescriptions on File Prior to Visit  Medication Sig  . Cetirizine HCl (ZYRTEC ALLERGY) 10 MG CAPS Take 10 mg by mouth daily.  . drospirenone-ethinyl estradiol (OCELLA) 3-0.03 MG tablet Take 1 tablet by mouth daily.  . polyethylene glycol powder (GLYCOLAX/MIRALAX) powder TAKE 17 GRAMS BY MOUTH AS DIRECTED BY PRESCRIBER   No current facility-administered medications on file prior to visit.     Problem list She has Allergic rhinitis; Vitamin D deficiency; Other abnormal glucose; and HPV test positive on her problem list.   Review of Systems  Constitutional: Negative.   HENT: Negative.   Respiratory: Negative.   Cardiovascular: Negative.   Gastrointestinal: Negative.   Genitourinary: Negative.   Musculoskeletal: Negative.   Skin: Negative.   Neurological: Negative.   Hematological: Negative.   Psychiatric/Behavioral: Positive for decreased concentration and dysphoric mood. Negative for agitation, behavioral problems, confusion, hallucinations, self-injury, sleep disturbance and suicidal ideas. The patient is nervous/anxious. The patient is not hyperactive.        Objective:   Physical Exam  Constitutional: She is oriented to person, place, and time. She appears well-developed and well-nourished.  HENT:  Head: Normocephalic and atraumatic.  Right  Ear: External ear normal.  Left Ear: External ear normal.  Mouth/Throat: Oropharynx is clear and moist.  Eyes: Pupils are equal, round, and reactive to light. Conjunctivae and EOM are normal.  Neck: Normal range of motion. Neck supple. No thyromegaly present.  Cardiovascular: Normal rate, regular rhythm and normal heart sounds.  Exam reveals no gallop and no friction rub.   No murmur heard. Pulmonary/Chest: Effort normal and breath sounds normal. No respiratory distress. She has no wheezes.  Abdominal: Soft. Bowel sounds are normal. She exhibits no distension and no mass. There is no tenderness. There is no rebound and no guarding.  Musculoskeletal: Normal range of motion.  Lymphadenopathy:    She has no cervical adenopathy.  Neurological: She is alert and oriented to person, place, and time. She displays normal reflexes. No cranial nerve deficit. Coordination normal.  Skin: Skin is warm and dry.  Psychiatric: She has a normal mood and affect.       Assessment & Plan:   Vitamin D deficiency Get on vitamin D to help mood, 5000 a day  Anxiety Suggest CBT therapy/couneling, no SI/HI -     citalopram (CELEXA) 20 MG tablet; Take 1 tablet (20 mg total) by mouth daily.   Follow up 6 weeks Go to Er if any SI/HI

## 2016-09-29 ENCOUNTER — Ambulatory Visit: Payer: Self-pay | Admitting: Physician Assistant

## 2016-11-09 ENCOUNTER — Ambulatory Visit: Payer: Self-pay | Admitting: Physician Assistant

## 2016-11-16 NOTE — Progress Notes (Signed)
    Assessment and Plan:   Vitamin D deficiency -     VITAMIN D 25 Hydroxy (Vit-D Deficiency, Fractures)  Needs flu shot -     Flu Vaccine QUAD 36+ mos IM  Medication management -     CBC with Differential/Platelet -     Hepatic function panel  Anxiety-  continue medications, stress management techniques discussed, increase water, good sleep hygiene discussed, increase exercise, and increase veggies.  No SI/HI   Continue diet and meds as discussed. Further disposition pending results of labs. Over 30 minutes of exam, counseling, chart review, and critical decision making was performed  Future Appointments Date Time Provider Department Center  06/07/2017 10:00 AM Quentin Mullingollier, Khamiyah Grefe, PA-C [facility] None     HPI 21 y.o. female  presents for 3 month follow up on hypertension, cholesterol, prediabetes, and vitamin D deficiency.   Her blood pressure has been controlled at home, today their BP is BP: 122/80   She does workout, gym twice a week.  She denies chest pain, shortness of breath, dizziness. 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  She has had some depression issues, on celexa.   Patient is on Vitamin D supplement.  She is on vitamin D Lab Results  Component Value Date   VD25OH 26 (L) 06/04/2016      Current Medications:  Current Outpatient Prescriptions on File Prior to Visit  Medication Sig  . Cetirizine HCl (ZYRTEC ALLERGY) 10 MG CAPS Take 10 mg by mouth daily.  . citalopram (CELEXA) 20 MG tablet Take 1 tablet (20 mg total) by mouth daily.  . drospirenone-ethinyl estradiol (OCELLA) 3-0.03 MG tablet Take 1 tablet by mouth daily.  . polyethylene glycol powder (GLYCOLAX/MIRALAX) powder TAKE 17 GRAMS BY MOUTH AS DIRECTED BY PRESCRIBER   No current facility-administered medications on file prior to visit.     Medical History:  Past Medical History:  Diagnosis Date  . Allergy   . Unspecified vitamin D deficiency    Allergies: No  Known Allergies   Review of Systems:  ROS  Family history- Review and unchanged Social history- Review and unchanged Physical Exam: BP 122/80   Pulse 87   Temp 98.2 F (36.8 C)   Resp 16   Ht 5' 4.5" (1.638 m)   Wt 165 lb 3.2 oz (74.9 kg)   LMP 11/11/2016   SpO2 97%   BMI 27.92 kg/m  Wt Readings from Last 3 Encounters:  11/17/16 165 lb 3.2 oz (74.9 kg)  09/28/16 160 lb (72.6 kg)  06/04/16 167 lb 12.8 oz (76.1 kg)   General Appearance: Well nourished, in no apparent distress. Eyes: PERRLA, EOMs, conjunctiva no swelling or erythema Sinuses: No Frontal/maxillary tenderness ENT/Mouth: Ext aud canals clear, TMs without erythema, bulging. No erythema, swelling, or exudate on post pharynx.  Tonsils not swollen or erythematous. Hearing normal.  Neck: Supple, thyroid normal.  Respiratory: Respiratory effort normal, BS equal bilaterally without rales, rhonchi, wheezing or stridor.  Cardio: RRR with no MRGs. Brisk peripheral pulses without edema.  Abdomen: Soft, + BS,  Non tender, no guarding, rebound, hernias, masses. Lymphatics: Non tender without lymphadenopathy.  Musculoskeletal: Full ROM, 5/5 strength, Normal gait Skin: Warm, dry without rashes, lesions, ecchymosis.  Neuro: Cranial nerves intact. Normal muscle tone, no cerebellar symptoms. Psych: Awake and oriented X 3, normal affect, Insight and Judgment appropriate.    Quentin MullingAmanda Artina Minella, PA-C 4:12 PM [facility]

## 2016-11-17 ENCOUNTER — Encounter: Payer: Self-pay | Admitting: Physician Assistant

## 2016-11-17 ENCOUNTER — Ambulatory Visit (INDEPENDENT_AMBULATORY_CARE_PROVIDER_SITE_OTHER): Payer: BLUE CROSS/BLUE SHIELD | Admitting: Physician Assistant

## 2016-11-17 VITALS — BP 122/80 | HR 87 | Temp 98.2°F | Resp 16 | Ht 64.5 in | Wt 165.2 lb

## 2016-11-17 DIAGNOSIS — Z23 Encounter for immunization: Secondary | ICD-10-CM | POA: Diagnosis not present

## 2016-11-17 DIAGNOSIS — F419 Anxiety disorder, unspecified: Secondary | ICD-10-CM

## 2016-11-17 DIAGNOSIS — Z79899 Other long term (current) drug therapy: Secondary | ICD-10-CM

## 2016-11-17 DIAGNOSIS — E559 Vitamin D deficiency, unspecified: Secondary | ICD-10-CM

## 2016-11-18 LAB — CBC WITH DIFFERENTIAL/PLATELET
Basophils Absolute: 36 cells/uL (ref 0–200)
Basophils Relative: 0.4 %
Eosinophils Absolute: 207 cells/uL (ref 15–500)
Eosinophils Relative: 2.3 %
HCT: 38.4 % (ref 35.0–45.0)
Hemoglobin: 13 g/dL (ref 11.7–15.5)
Lymphs Abs: 3051 cells/uL (ref 850–3900)
MCH: 30.1 pg (ref 27.0–33.0)
MCHC: 33.9 g/dL (ref 32.0–36.0)
MCV: 88.9 fL (ref 80.0–100.0)
MPV: 11.6 fL (ref 7.5–12.5)
Monocytes Relative: 8.4 %
Neutro Abs: 4950 cells/uL (ref 1500–7800)
Neutrophils Relative %: 55 %
Platelets: 254 10*3/uL (ref 140–400)
RBC: 4.32 10*6/uL (ref 3.80–5.10)
RDW: 12.4 % (ref 11.0–15.0)
Total Lymphocyte: 33.9 %
WBC mixed population: 756 cells/uL (ref 200–950)
WBC: 9 10*3/uL (ref 3.8–10.8)

## 2016-11-18 LAB — HEPATIC FUNCTION PANEL
AG Ratio: 1.3 (calc) (ref 1.0–2.5)
ALT: 10 U/L (ref 6–29)
AST: 17 U/L (ref 10–30)
Albumin: 4.1 g/dL (ref 3.6–5.1)
Alkaline phosphatase (APISO): 39 U/L (ref 33–115)
Bilirubin, Direct: 0.1 mg/dL (ref 0.0–0.2)
Globulin: 3.2 g/dL (calc) (ref 1.9–3.7)
Indirect Bilirubin: 0.1 mg/dL (calc) — ABNORMAL LOW (ref 0.2–1.2)
Total Bilirubin: 0.2 mg/dL (ref 0.2–1.2)
Total Protein: 7.3 g/dL (ref 6.1–8.1)

## 2016-11-18 LAB — VITAMIN D 25 HYDROXY (VIT D DEFICIENCY, FRACTURES): Vit D, 25-Hydroxy: 35 ng/mL (ref 30–100)

## 2016-11-18 NOTE — Progress Notes (Signed)
Pt aware of lab results & voiced understanding of those results.

## 2017-02-16 NOTE — Progress Notes (Signed)
Assessment and Plan:  Beth Thomas was seen today for headache.  Diagnoses and all orders for this visit:  Tension headache Stress management techniques discussed, increase water, good sleep hygiene discussed, increase exercise, and increase veggies.  Recommended heat application, medical massage for neck tension -     cyclobenzaprine (FLEXERIL) 5 MG tablet; Take 1 tablet (5 mg total) by mouth 3 (three) times daily as needed for muscle spasms.  Will go to the ER if worsening headache, changes vision/speech, imbalance, weakness.  Further disposition pending results of labs. Discussed med's effects and SE's.   Over 15 minutes of exam, counseling, chart review, and critical decision making was performed.   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 She describes the pain as constant pressure, 6/10. She has been taking an aleve to help with pain, which improves the pain somewhat - she reports she rests and wakes up with headaches resolved. She denies dizziness, fever/chills, rashes, N/V, vision changes- she does endorses worse pain with bright sunlight. Denies weakness, numbness/tingling, olfactory changes or seeing halos.   She reports a history of migraines for which [facility] and goes to bed which resolves this quickly- she reports these headaches feel different.   She reports  water intake 16-50 fluid ounces daily; she drinks 1 caffeinated soda daily; she does watch her diet thought has been eating more fast food due to just being done with finals; she does work out at [employer] gym regularly.   Past Medical History:  Diagnosis Date  . Allergy   . Unspecified vitamin D deficiency      No Known Allergies  Current Outpatient Medications on File Prior to Visit  Medication Sig  . Cetirizine HCl (ZYRTEC ALLERGY) 10 MG CAPS Take 10 mg by mouth daily.  . citalopram (CELEXA) 20 MG tablet Take 1 tablet (20 mg total) by mouth daily.  . drospirenone-ethinyl estradiol (OCELLA) 3-0.03 MG tablet Take 1 tablet by mouth daily.  . polyethylene glycol powder (GLYCOLAX/MIRALAX) powder TAKE 17 GRAMS BY MOUTH AS DIRECTED BY PRESCRIBER   No current facility-administered medications on file prior to visit.     ROS: all negative except above.   Physical Exam:  BP 116/76   Pulse 84   Temp (!) 97.5 F (36.4 C)   Wt 174 lb 6.4 oz (79.1 kg)   LMP 02/11/2017 (Exact Date)   SpO2 98%   BMI 29.47 kg/m   General Appearance: Well nourished, smiling, in no acute distress. Eyes: PERRLA, EOMs, conjunctiva no swelling or erythema Sinuses: No Frontal/maxillary tenderness ENT/Mouth: Ext aud canals clear, TMs without erythema, bulging. No erythema, swelling, or exudate on post pharynx.  Tonsils not swollen or erythematous. Hearing normal.  Neck: Supple, thyroid normal.  Respiratory: Respiratory effort normal, BS equal bilaterally without rales, rhonchi, wheezing or stridor.  Cardio: RRR with no MRGs. Brisk peripheral pulses without edema.  Abdomen:  Soft, + BS.  Non tender, no guarding, rebound, hernias, masses. Lymphatics: Non tender without lymphadenopathy.  Musculoskeletal: Full ROM, 5/5 strength, normal gait. Some tension and tenderness through neck and bilateral shoulders, paraspinals.  Skin: Warm, dry without rashes, lesions, ecchymosis.  Neuro: Cranial nerves intact. Normal muscle  tone, no cerebellar symptoms. Sensation intact.  Psych: Awake and oriented X 3, normal affect, Insight and Judgment appropriate.     Beth MakerAshley C Cesare Sumlin, NP 10:11 AM [facility]

## 2017-02-17 ENCOUNTER — Ambulatory Visit: Payer: BLUE CROSS/BLUE SHIELD | Admitting: Adult Health

## 2017-02-17 ENCOUNTER — Encounter: Payer: Self-pay | Admitting: Adult Health

## 2017-02-17 VITALS — BP 116/76 | HR 84 | Temp 97.5°F | Wt 174.4 lb

## 2017-02-17 DIAGNOSIS — G44209 Tension-type headache, unspecified, not intractable: Secondary | ICD-10-CM | POA: Diagnosis not present

## 2017-02-17 MED ORDER — CYCLOBENZAPRINE HCL 5 MG PO TABS: 5.0000 mg | ORAL_TABLET | Freq: Three times a day (TID) | ORAL | 0 refills | Status: DC | PRN

## 2017-02-17 NOTE — Patient Instructions (Signed)
Identify stress - reduce, hydration, medical massage    Please remember, common headache triggers are: sleep deprivation, dehydration, overheating, stress, hypoglycemia or skipping meals and blood sugar fluctuations, excessive pain medications or excessive alcohol use or caffeine withdrawal.   Some people have food triggers such as aged cheese, orange juice or chocolate, especially dark chocolate, or MSG (monosodium glutamate). Try to avoid these headache triggers as much possible.   It may be helpful to keep a headache diary to figure out what makes your headaches worse or brings them on and what alleviates them. Some people report headache onset after exercise but studies have shown that regular exercise may actually prevent headaches from coming. If you have exercise-induced headaches, please make sure that you drink plenty of fluid before and after exercising and that you do not over do it and do not overheat.  Also you can try CoQ10 100mg  TID or B2 400mg  a day as prevention.   Also there is such a thing called rebound headache from over use of acute medications.  Please do not use rescue or acute medications more than 10 days a month or more than 3 days per week, this can cause a withdrawal and a rebound headache.   Please go to the ER if there is weakness, thunderclap headache, visual changes, or any concerning factors     Tension Headache A tension headache is a feeling of pain, pressure, or aching that is often felt over the front and sides of the head. The pain can be dull, or it can feel tight (constricting). Tension headaches are not normally associated with nausea or vomiting, and they do not get worse with physical activity. Tension headaches can last from 30 minutes to several days. This is the most common type of headache. CAUSES The exact cause of this condition is not known. Tension headaches often begin after stress, anxiety, or depression. Other triggers may  include:  Alcohol.  Too much caffeine, or caffeine withdrawal.  Respiratory infections, such as colds, flu, or sinus infections.  Dental problems or teeth clenching.  Fatigue.  Holding your head and neck in the same position for a long period of time, such as while using a computer.  Smoking. SYMPTOMS Symptoms of this condition include:  A feeling of pressure around the head.  Dull, aching head pain.  Pain felt over the front and sides of the head.  Tenderness in the muscles of the head, neck, and shoulders. DIAGNOSIS This condition may be diagnosed based on your symptoms and a physical exam. Tests may be done, such as a CT scan or an MRI of your head. These tests may be done if your symptoms are severe or unusual. TREATMENT This condition may be treated with lifestyle changes and medicines to help relieve symptoms. HOME CARE INSTRUCTIONS Managing Pain  Take over-the-counter and prescription medicines only as told by your health care provider.  Lie down in a dark, quiet room when you have a headache.  If directed, apply ice to the head and neck area: ? Put ice in a plastic bag. ? Place a towel between your skin and the bag. ? Leave the ice on for 20 minutes, 2-3 times per day.  Use a heating pad or a hot shower to apply heat to the head and neck area as told by your health care provider. Eating and Drinking  Eat meals on a regular schedule.  Limit alcohol use.  Decrease your caffeine intake, or stop using caffeine. General Instructions  Keep all follow-up visits as told by your health care provider. This is important.  Keep a headache journal to help find out what may trigger your headaches. For example, write down: ? What you eat and drink. ? How much sleep you get. ? Any change to your diet or medicines.  Try massage or other relaxation techniques.  Limit stress.  Sit up straight, and avoid tensing your muscles.  Do not use tobacco products, including  cigarettes, chewing tobacco, or e-cigarettes. If you need help quitting, ask your health care provider.  Exercise regularly as told by your health care provider.  Get 7-9 hours of sleep, or the amount recommended by your health care provider. SEEK MEDICAL CARE IF:  Your symptoms are not helped by medicine.  You have a headache that is different from what you normally experience.  You have nausea or you vomit.  You have a fever. SEEK IMMEDIATE MEDICAL CARE IF:  Your headache becomes severe.  You have repeated vomiting.  You have a stiff neck.  You have a loss of vision.  You have problems with speech.  You have pain in your eye or ear.  You have muscular weakness or loss of muscle control.  You lose your balance or you have trouble walking.  You feel faint or you pass out.  You have confusion. This information is not intended to replace advice given to you by your health care provider. Make sure you discuss any questions you have with your health care provider. Document Released: 03/02/2005 Document Revised: 11/21/2014 Document Reviewed: 06/25/2014 Elsevier Interactive Patient Education  2017 [employer]

## 2017-02-18 ENCOUNTER — Other Ambulatory Visit: Payer: Self-pay | Admitting: Physician Assistant

## 2017-02-25 IMAGING — DX DG CHEST 2V
2 series · 2 of 2 positions shown · non-contrast
Comparison: None

CLINICAL DATA: Loss of consciousness today, fell striking head,
headache, vomiting prior to loss of consciousness and again at
hospital

EXAM:
CHEST  2 VIEW

[chest pa]
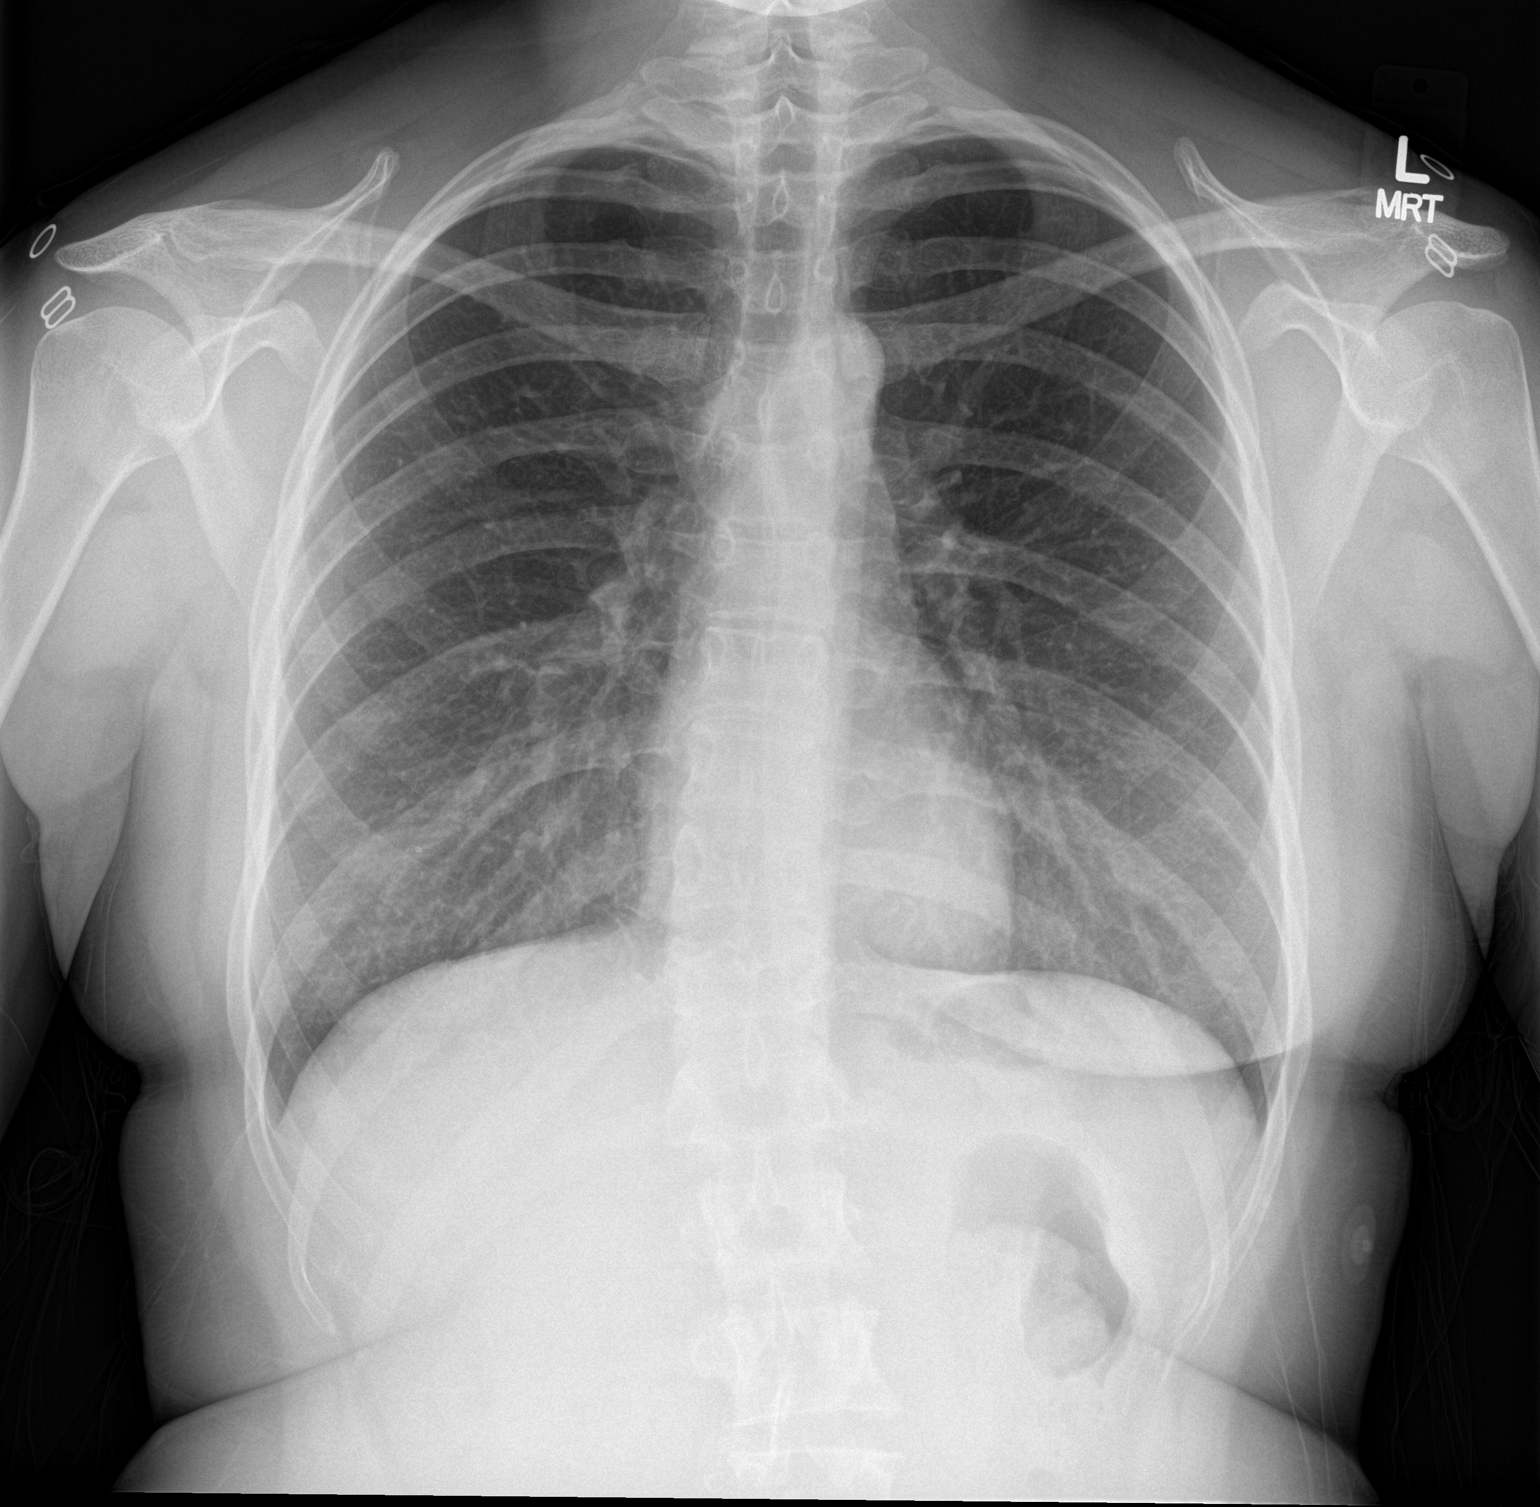

[chest lat]
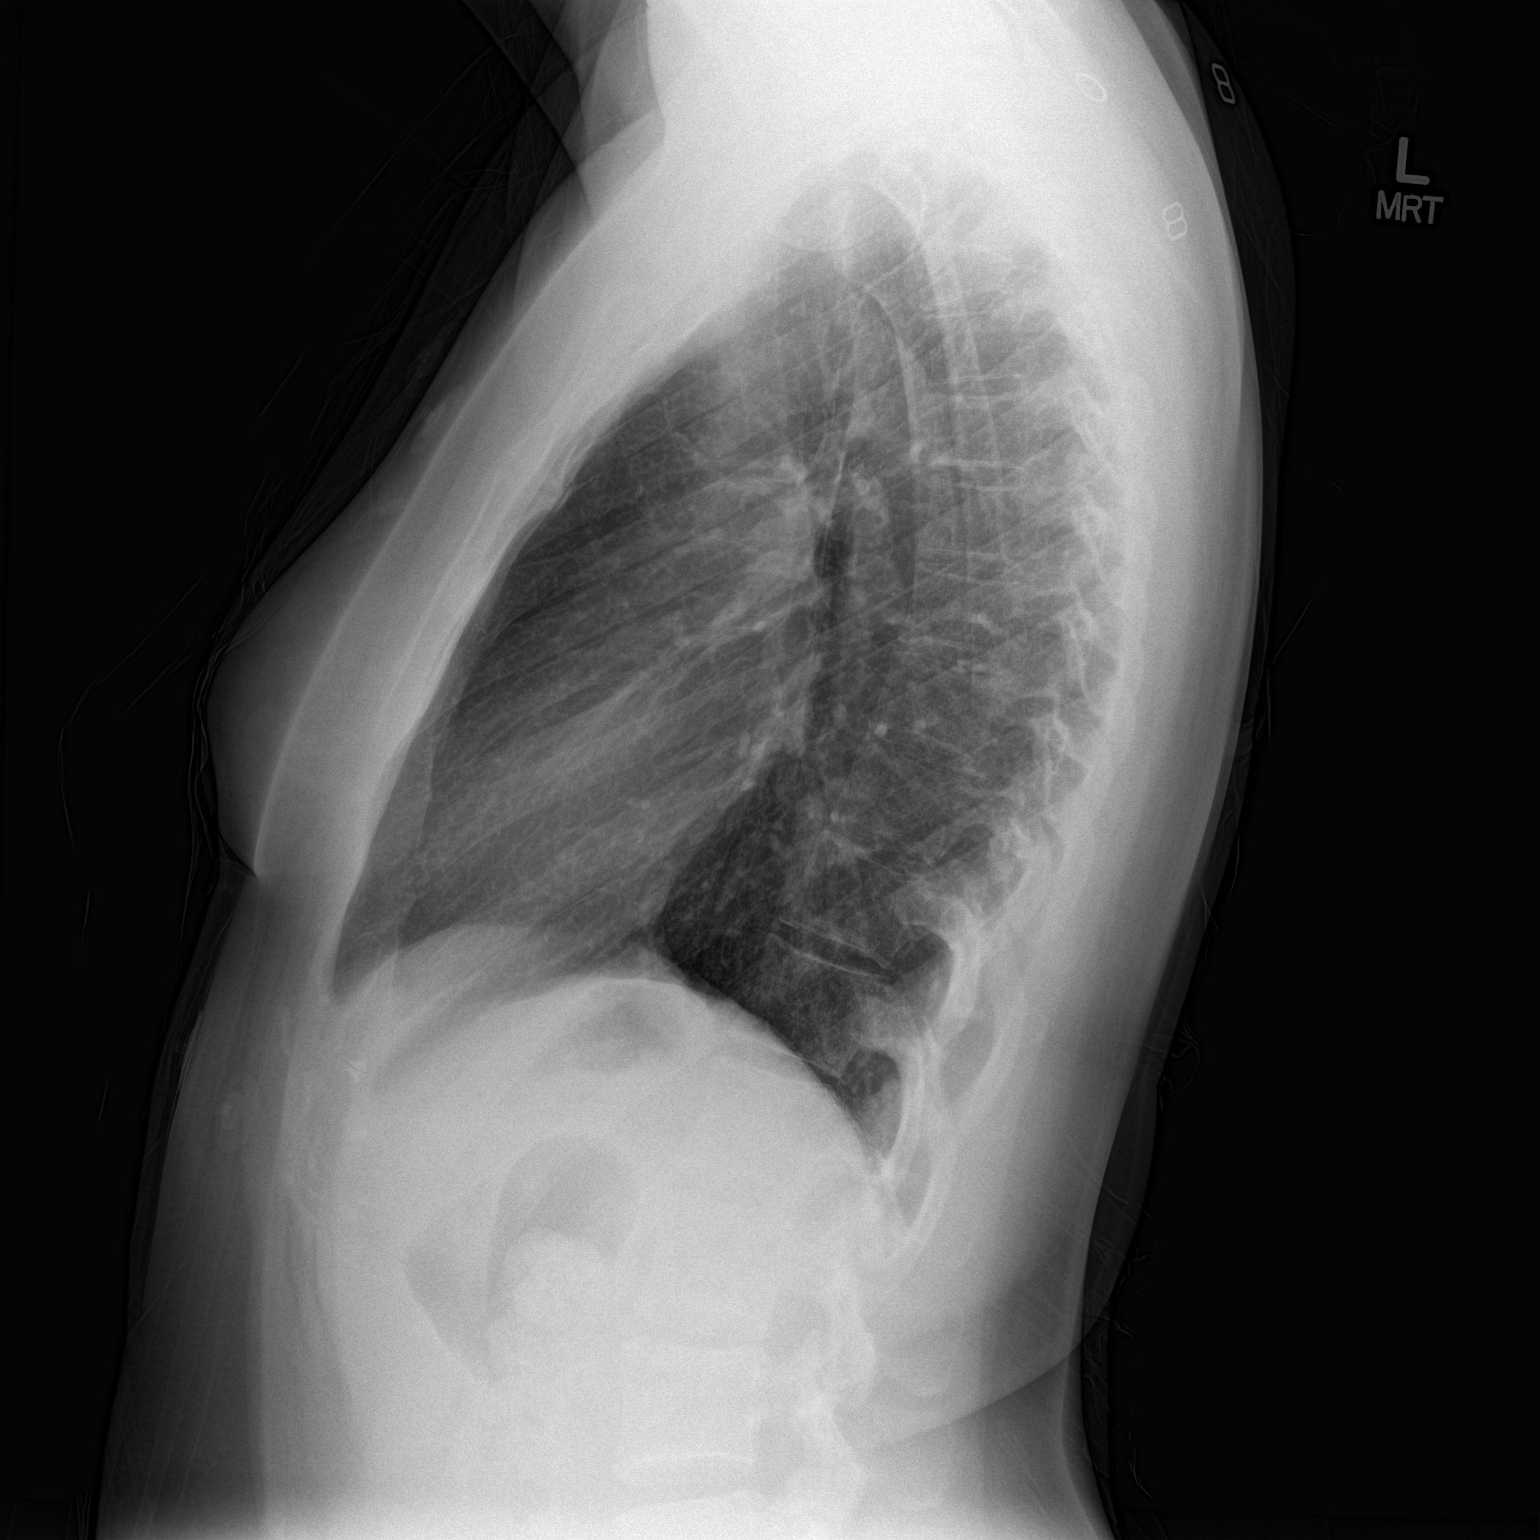

[2 of 2 positions shown; findings below may reference images not displayed]

FINDINGS: Normal heart size, mediastinal contours, and pulmonary vascularity.

Lungs clear.

No pleural effusion or pneumothorax.

Broad based biconvex thoracolumbar scoliosis.
IMPRESSION: No acute abnormalities.

## 2017-04-17 ENCOUNTER — Other Ambulatory Visit: Payer: Self-pay | Admitting: Internal Medicine

## 2017-04-22 ENCOUNTER — Other Ambulatory Visit: Payer: Self-pay | Admitting: Internal Medicine

## 2017-05-03 ENCOUNTER — Ambulatory Visit: Payer: BLUE CROSS/BLUE SHIELD | Admitting: Adult Health

## 2017-05-03 ENCOUNTER — Encounter: Payer: Self-pay | Admitting: Adult Health

## 2017-05-03 VITALS — BP 110/74 | HR 96 | Temp 97.9°F | Ht 64.5 in | Wt 165.0 lb

## 2017-05-03 DIAGNOSIS — J0110 Acute frontal sinusitis, unspecified: Secondary | ICD-10-CM

## 2017-05-03 MED ORDER — AZITHROMYCIN 250 MG PO TABS: ORAL_TABLET | ORAL | 1 refills | Status: AC

## 2017-05-03 MED ORDER — PREDNISONE 20 MG PO TABS: ORAL_TABLET | ORAL | 0 refills | Status: DC

## 2017-05-03 NOTE — Patient Instructions (Addendum)

## 2017-05-03 NOTE — Progress Notes (Signed)
Assessment and Plan:  Beth Thomas was seen today for uri.  Diagnoses and all orders for this visit:  Acute non-recurrent frontal sinusitis - Discussed the importance of avoiding unnecessary antibiotic therapy. Suggested symptomatic OTC remedies. Nasal saline spray for congestion. Nasal steroids, allergy pill, oral steroids Follow up as needed. -     predniSONE (DELTASONE) 20 MG tablet; 2 tablets daily for 3 days, 1 tablet daily for 4 days.  Fill and take if symptoms worsening rather than improving:  -     azithromycin (ZITHROMAX) 250 MG tablet; Take 2 tablets (500 mg) on  Day 1,  followed by 1 tablet (250 mg) once daily on Days 2 through 5.  Further disposition pending results of labs. Discussed med's effects and SE's.   Over 15 minutes of exam, counseling, chart review, and critical decision making was performed.   Future Appointments  Date Time Provider Department Center  06/07/2017 10:00 AM Quentin Mullingollier, Amanda, PA-C [facility] None    ------------------------------------------------------------------------------------------------------------------   HPI BP 110/74   Pulse 96   Temp 97.9 F (36.6 C)   Ht 5' 4.5" (1.638 m)   Wt 165 lb (74.8 kg)   SpO2 98%   BMI 27.88 kg/m   21 y.o.female presents for nasal congestion, drainage, scratchy throat, frontal headache, sore throat, productive cough (thick green mucus). She reports sensation of fever/chills without measured fever. She denies vision changes, ear pain, dizziness, chest pain, dyspnea, nausea, vomiting, rashes, neck pain.   She has been taking zyrtec which has not improved symptoms. She has had the flu vaccine this season. 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 S/p tonsillectomy in 2017. Not a smoker.   Past Medical History:  Diagnosis Date  . Allergy   . Unspecified vitamin D deficiency      No Known Allergies  Current Outpatient Medications on File Prior to Visit  Medication Sig  . Cetirizine  HCl (ZYRTEC ALLERGY) 10 MG CAPS Take 10 mg by mouth daily.  . citalopram (CELEXA) 20 MG tablet TAKE 1 TABLET(20 MG) BY MOUTH DAILY  . cyclobenzaprine (FLEXERIL) 5 MG tablet Take 1 tablet (5 mg total) by mouth 3 (three) times daily as needed for muscle spasms.  . drospirenone-ethinyl estradiol (OCELLA) 3-0.03 MG tablet Take 1 tablet by mouth daily.  . polyethylene glycol powder (GLYCOLAX/MIRALAX) powder DISSOLVE 17 GRAMS IN LIQUID AND DRINK AS DIRECTED BY PRESCRIBER   No current facility-administered medications on file prior to visit.     ROS: Review of Systems  Constitutional: Positive for malaise/fatigue. Negative for chills, diaphoresis and fever.  HENT: Positive for congestion, sinus pain and sore throat. Negative for ear discharge, ear pain, hearing loss and tinnitus.   Eyes: Negative for blurred vision, pain, discharge and redness.  Respiratory: Positive for cough and sputum production. Negative for hemoptysis, shortness of breath, wheezing and stridor.   Cardiovascular: Negative for chest pain, palpitations and orthopnea.  Gastrointestinal: Negative for abdominal pain, diarrhea, nausea and vomiting.  Genitourinary: Negative.   Musculoskeletal: Negative for joint pain and myalgias.  Skin: Negative for rash.  Neurological: Positive for headaches (Mild). Negative for dizziness, sensory change and weakness.  Endo/Heme/Allergies: Negative for environmental allergies.  Psychiatric/Behavioral: Negative.   All other systems reviewed and are negative.   Physical Exam:  BP 110/74   Pulse 96   Temp 97.9 F (36.6 C)   Ht 5' 4.5" (1.638 m)   Wt 165 lb (74.8 kg)   SpO2 98%   BMI 27.88 kg/m  General Appearance: Well nourished, in no apparent distress. Eyes: PERRLA, EOMs, conjunctiva no swelling or erythema Sinuses: Bilatearl Frontal sinus tenderness, no maxillary tenderness ENT/Mouth: Ext aud canals clear, TMs without erythema, bulging. No erythema, swelling, or exudate on post  pharynx.  Tonsils absent. Hearing normal.  Neck: Supple.  Respiratory: Respiratory effort normal, BS equal bilaterally without rales, rhonchi, wheezing or stridor.  Cardio: RRR with no MRGs. Brisk peripheral pulses without edema.  Abdomen: Soft, + BS.  Non tender, no guarding. Lymphatics: Non tender without lymphadenopathy.  Musculoskeletal: symmetrical strength, normal gait.  Skin: Warm, dry without rashes, lesions, ecchymosis.   Psych: Awake and oriented X 3, normal affect, Insight and Judgment appropriate.    Dan Maker, NP 4:42 PM [facility]

## 2017-06-02 NOTE — Progress Notes (Deleted)
Complete Physical  Assessment and Plan: Allergic rhinitis, unspecified allergic rhinitis type  Vitamin D deficiency - VITAMIN D 25 Hydroxy (Vit-D Deficiency, Fractures)  Other abnormal glucose - TSH - Hemoglobin A1c - EKG 12-Lead  Screening cholesterol level - Lipid panel   Screening for blood or protein in urine - Urinalysis, Routine w reflex microscopic (not at [facility]) - Microalbumin / creatinine urine ratio  Screening for cervical cancer - Cytology - PAP   Medication management - CBC with Differential/Platelet - BASIC METABOLIC PANEL WITH GFR - Hepatic function panel - Magnesium  Routine general medical examination at a health care facility Health Maintenance- Discussed STD testing, safe sex, alcohol and drug awareness, drinking and driving dangers, wearing a seat belt and general safety measures for young adult. - Cytology - PAP  Anemia, unspecified anemia type - Iron and TIBC - Ferritin - Vitamin B12  Screen for STD (sexually transmitted disease) - GC/Chlamydia Probe Amp - RPR - HIV antibody - HSV(herpes simplex vrs) 1+2 ab-IgG  Encounter for initial prescription of contraceptive pills -     drospirenone-ethinyl estradiol (OCELLA) 3-0.03 MG tablet; Take 1 tablet by mouth daily.  Vaginal discharge -     TSH -     WET PREP BY MOLECULAR PROBE  Other orders -     Tdap vaccine greater than or equal to 7yo IM  Discussed med's effects and SE's. Screening labs and tests as requested with regular follow-up as recommended. Over 40 minutes of exam, counseling, chart review and critical decision making was performed  HPI  This very nice 22 y.o.female presents for complete physical.  Patient has no major health issues.  Patient reports no complaints at this time.  She does workout.  Finally, patient has history of Vitamin D Deficiency, not on medication.  She has been dating neighbor, known x 10 years, has dating x 4 months, took plan B 1 week ago, has had some  bleeding since that time, negative preg test at home.   Current Medications:  Current Outpatient Medications on File Prior to Visit  Medication Sig Dispense Refill  . Cetirizine HCl (ZYRTEC ALLERGY) 10 MG CAPS Take 10 mg by mouth daily.    . citalopram (CELEXA) 20 MG tablet TAKE 1 TABLET(20 MG) BY MOUTH DAILY 30 tablet 1  . cyclobenzaprine (FLEXERIL) 5 MG tablet Take 1 tablet (5 mg total) by mouth 3 (three) times daily as needed for muscle spasms. 60 tablet 0  . drospirenone-ethinyl estradiol (OCELLA) 3-0.03 MG tablet Take 1 tablet by mouth daily. 3 Package 4  . polyethylene glycol powder (GLYCOLAX/MIRALAX) powder DISSOLVE 17 GRAMS IN LIQUID AND DRINK AS DIRECTED BY PRESCRIBER 765 g 0  . predniSONE (DELTASONE) 20 MG tablet 2 tablets daily for 3 days, 1 tablet daily for 4 days. 10 tablet 0   No current facility-administered medications on file prior to visit.    Health Maintenance:   Immunization History  Administered Date(s) Administered  . HPV 9-valent 10/10/2014, 12/12/2014, 05/14/2015  . Influenza Inj Mdck Quad With Preservative 11/17/2016  . Influenza-Unspecified 11/17/2016  . Tdap 11/15/2006, 06/04/2016   TD/TDAP: 2008 DUE Influenza: declines Pneumovax: N/a Prevnar 13: N/a HPV vaccine  completed  LMP: No LMP recorded. Sexually Active: new partner, + sexually active, would like to get back on BCP, will get STD testing today Pap: normal, will check yearly until 28 MGM:  [place] left breast Last Dental Exam: Last Eye Exam:  Medical History:  Past Medical History:  Diagnosis Date  . Allergy   .  Unspecified vitamin D deficiency    Allergies No Known Allergies  SURGICAL HISTORY She  has a past surgical history that includes Tonsilectomy/adenoidectomy with myringotomy (01/2016). FAMILY HISTORY Her family history includes Diabetes in her mother; Hyperlipidemia in her father and mother; Hypertension in her father and mother. SOCIAL HISTORY She  reports that she is a non-smoker  but has been exposed to tobacco smoke. she has never used smokeless tobacco. She reports that she does not drink alcohol or use drugs.  Review of Systems: Review of Systems  Constitutional: Negative.   HENT: Negative.   Eyes: Negative.   Respiratory: Negative.   Cardiovascular: Negative.   Gastrointestinal: Negative.   Genitourinary: Negative.   Musculoskeletal: Negative.   Skin: Negative.   Neurological: Negative.   Endo/Heme/Allergies: Negative.   Psychiatric/Behavioral: Negative.     Physical Exam: Estimated body mass index is 27.88 kg/m as calculated from the following:   Height as of 05/03/17: 5' 4.5" (1.638 m).   Weight as of 05/03/17: 165 lb (74.8 kg). There were no vitals taken for this visit. General Appearance: Well nourished, in no apparent distress.  Eyes: PERRLA, EOMs, conjunctiva no swelling or erythema, normal fundi and vessels.  Sinuses: No Frontal/maxillary tenderness  ENT/Mouth: Ext aud canals clear, normal light reflex with TMs without erythema, bulging. Good dentition. No erythema, swelling, or exudate on post pharynx. Tonsils not swollen or erythematous. Hearing normal.  Neck: Supple, thyroid normal. No bruits  Respiratory: Respiratory effort normal, BS equal bilaterally without rales, rhonchi, wheezing or stridor.  Cardio: RRR without murmurs, rubs or gallops. Brisk peripheral pulses without edema.  Chest: symmetric, with normal excursions and percussion.  Breasts: Symmetric, without lumps, nipple discharge, retractions.  Abdomen: Soft, nontender, no guarding, rebound, hernias, masses, or organomegaly.  Lymphatics: Non tender without lymphadenopathy.  Genitourinary: normal external genitalia, vulva, vagina, cervix, uterus and adnexa, PAP: Pap smear done today. Musculoskeletal: Full ROM all peripheral extremities,5/5 strength, and normal gait.  Skin: Warm, dry without rashes, lesions, ecchymosis. Neuro: Cranial nerves intact, reflexes equal bilaterally. Normal  muscle tone, no cerebellar symptoms. Sensation intact.  Psych: Awake and oriented X 3, normal affect, Insight and Judgment appropriate.   EKG: defer  Beth MullingAmanda Ridhi Thomas 7:54 AM [facility]

## 2017-06-07 ENCOUNTER — Encounter: Payer: Self-pay | Admitting: Physician Assistant

## 2017-06-18 NOTE — Progress Notes (Signed)
Complete Physical  Assessment and Plan: Allergic rhinitis, unspecified allergic rhinitis type Continue allergy pill, can add nasal spray  Vitamin D deficiency - VITAMIN D 25 Hydroxy (Vit-D Deficiency, Fractures)  Screening cholesterol level - Lipid panel  Screening for cervical cancer - Cytology - PAP   Medication management - CBC with Differential/Platelet - BASIC METABOLIC PANEL WITH GFR - Hepatic function panel  Routine general medical examination at a health care facility Health Maintenance- Discussed STD testing declines this year, safe sex, alcohol and drug awareness, drinking and driving dangers, wearing a seat belt and general safety measures for young adult. - Cytology - PAP  Screen for STD (sexually transmitted disease) -declines this year- no new partners  Encounter for initial prescription of contraceptive pills -     drospirenone-ethinyl estradiol (OCELLA) 3-0.03 MG tablet; Take 1 tablet by mouth daily.  Seb Dermatitis ecrisa samples given to patient, baby shampoo If not better call the office  Discussed med's effects and SE's. Screening labs and tests as requested with regular follow-up as recommended. Over 40 minutes of exam, counseling, chart review and critical decision making was performed  HPI  This very nice 22 y.o.female presents for complete physical.  Patient has no major health issues.  Patient reports no complaints at this time.   Has some non puritic bumps on left outer corner of eye with redness. No new makeup. Has been there x 1 week.   She does workout but has not worked out in a month.   She is not dating anyone, declines STD testing, she is on BCP.  Finally, patient has history of Vitamin D Deficiency, not on medication consistently.  Lab Results  Component Value Date   VD25OH 35 11/17/2016  '  BMI is Body mass index is 26.36 kg/m., she is working on diet and exercise. Wt Readings from Last 3 Encounters:  06/22/17 158 lb 6.4 oz (71.8  kg)  05/03/17 165 lb (74.8 kg)  02/17/17 174 lb 6.4 oz (79.1 kg)   The cholesterol last visit was:   Lab Results  Component Value Date   CHOL 162 06/04/2016   HDL 33 (L) 06/04/2016   LDLCALC 112 (H) 06/04/2016   TRIG 87 06/04/2016   CHOLHDL 4.9 06/04/2016   Last X9JA1C in the office was:  Lab Results  Component Value Date   HGBA1C 5.0 06/04/2016    Current Medications:  Current Outpatient Medications on File Prior to Visit  Medication Sig Dispense Refill  . Cetirizine HCl (ZYRTEC ALLERGY) 10 MG CAPS Take 10 mg by mouth daily.    . citalopram (CELEXA) 20 MG tablet TAKE 1 TABLET(20 MG) BY MOUTH DAILY 30 tablet 1  . cyclobenzaprine (FLEXERIL) 5 MG tablet Take 1 tablet (5 mg total) by mouth 3 (three) times daily as needed for muscle spasms. 60 tablet 0  . drospirenone-ethinyl estradiol (OCELLA) 3-0.03 MG tablet Take 1 tablet by mouth daily. 3 Package 4  . polyethylene glycol powder (GLYCOLAX/MIRALAX) powder DISSOLVE 17 GRAMS IN LIQUID AND DRINK AS DIRECTED BY PRESCRIBER 765 g 0   No current facility-administered medications on file prior to visit.    Health Maintenance:   Immunization History  Administered Date(s) Administered  . HPV 9-valent 10/10/2014, 12/12/2014, 05/14/2015  . Influenza Inj Mdck Quad With Preservative 11/17/2016  . Influenza-Unspecified 11/17/2016  . Tdap 11/15/2006, 06/04/2016   TD/TDAP: 2018 Influenza: 2018 Pneumovax: N/a Prevnar 13: N/a HPV vaccine  completed  LMP: Patient's last menstrual period was 06/09/2017. Sexually Active: on BCP, not  sexually active, declines STD testing.  Pap: 2018 normal, will check yearly until 28 MGM:  [place] left breast   Medical History:  Past Medical History:  Diagnosis Date  . Allergy   . Unspecified vitamin D deficiency    Allergies No Known Allergies  SURGICAL HISTORY She  has a past surgical history that includes Tonsilectomy/adenoidectomy with myringotomy (01/2016). FAMILY HISTORY Her family history includes  Diabetes in her mother; Hyperlipidemia in her father and mother; Hypertension in her father and mother. SOCIAL HISTORY She  reports that she is a non-smoker but has been exposed to tobacco smoke. She has never used smokeless tobacco. She reports that she does not drink alcohol or use drugs. 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   HENT: Negative.   Eyes: Negative.   Respiratory: Negative.   Cardiovascular: Negative.   Gastrointestinal: Negative.   Genitourinary: Negative.   Musculoskeletal: Negative.   Skin: Positive for rash. Negative for itching.  Neurological: Negative.   Endo/Heme/Allergies: Negative.   Psychiatric/Behavioral: Negative.     Physical Exam: Estimated body mass index is 26.36 kg/m as calculated from the following:   Height as of this encounter: 5\' 5"  (1.651 m).   Weight as of this encounter: 158 lb 6.4 oz (71.8 kg). BP 110/72   Pulse 74   Temp (!) 97.3 F (36.3 C)   Resp 18   Ht 5\' 5"  (1.651 m)   Wt 158 lb 6.4 oz (71.8 kg)   LMP 06/09/2017   SpO2 98%   BMI 26.36 kg/m  General Appearance: Well nourished, in no apparent distress.  Eyes: PERRLA, EOMs, conjunctiva no swelling or erythema, normal fundi and vessels. Right outer eye with erythematous scaly area with bumps.   Sinuses: No Frontal/maxillary tenderness  ENT/Mouth: Ext aud canals clear, normal light reflex with TMs without erythema, bulging. Good dentition. No erythema, swelling, or exudate on post pharynx. Tonsils not swollen or erythematous. Hearing normal.  Neck: Supple, thyroid normal. No bruits  Respiratory: Respiratory effort normal, BS equal bilaterally without rales, rhonchi, wheezing or stridor.  Cardio: RRR without murmurs, rubs or gallops. Brisk peripheral pulses without edema.  Chest: symmetric, with normal excursions and percussion.   Breasts: Symmetric, without lumps, nipple discharge, retractions.  Abdomen: Soft, nontender, no guarding, rebound, hernias, masses, or organomegaly.  Lymphatics: Non tender without lymphadenopathy.  Genitourinary: normal external genitalia, vulva, vagina, cervix, uterus and adnexa, PAP: Pap smear done today. Musculoskeletal: Full ROM all peripheral extremities,5/5 strength, and normal gait.  Skin: Warm, dry without rashes, lesions, ecchymosis. Neuro: Cranial nerves intact, reflexes equal bilaterally. Normal muscle tone, no cerebellar symptoms. Sensation intact.  Psych: Awake and oriented X 3, normal affect, Insight and Judgment appropriate.   EKG: defer  Quentin Mulling 10:27 AM [facility]

## 2017-06-22 ENCOUNTER — Encounter: Payer: Self-pay | Admitting: Physician Assistant

## 2017-06-22 ENCOUNTER — Ambulatory Visit: Payer: BLUE CROSS/BLUE SHIELD | Admitting: Physician Assistant

## 2017-06-22 ENCOUNTER — Other Ambulatory Visit (HOSPITAL_COMMUNITY)
Admission: RE | Admit: 2017-06-22 | Discharge: 2017-06-22 | Disposition: A | Discharge status: Home / Self Care | Payer: BLUE CROSS/BLUE SHIELD | Source: Ambulatory Visit | Attending: Internal Medicine | Admitting: Internal Medicine

## 2017-06-22 VITALS — BP 110/72 | HR 74 | Temp 97.3°F | Resp 18 | Ht 65.0 in | Wt 158.4 lb

## 2017-06-22 DIAGNOSIS — J309 Allergic rhinitis, unspecified: Secondary | ICD-10-CM

## 2017-06-22 DIAGNOSIS — Z124 Encounter for screening for malignant neoplasm of cervix: Secondary | ICD-10-CM | POA: Diagnosis present

## 2017-06-22 DIAGNOSIS — L219 Seborrheic dermatitis, unspecified: Secondary | ICD-10-CM

## 2017-06-22 DIAGNOSIS — Z0000 Encounter for general adult medical examination without abnormal findings: Secondary | ICD-10-CM

## 2017-06-22 DIAGNOSIS — E559 Vitamin D deficiency, unspecified: Secondary | ICD-10-CM | POA: Diagnosis not present

## 2017-06-22 DIAGNOSIS — Z1329 Encounter for screening for other suspected endocrine disorder: Secondary | ICD-10-CM | POA: Diagnosis not present

## 2017-06-22 DIAGNOSIS — Z6826 Body mass index (BMI) 26.0-26.9, adult: Secondary | ICD-10-CM

## 2017-06-22 DIAGNOSIS — Z13220 Encounter for screening for lipoid disorders: Secondary | ICD-10-CM | POA: Diagnosis not present

## 2017-06-22 DIAGNOSIS — Z79899 Other long term (current) drug therapy: Secondary | ICD-10-CM

## 2017-06-22 DIAGNOSIS — F419 Anxiety disorder, unspecified: Secondary | ICD-10-CM | POA: Insufficient documentation

## 2017-06-22 NOTE — Patient Instructions (Signed)
Can do a steroid nasal spary 1-2 sparys at night each nostril. Remember to spray each nostril twice towards the outer part of your eye.  Do not sniff but instead pinch your nose and tilt your head back to help the medicine get into your sinuses.  The best time to do this is at bedtime. Stop if you get blurred vision or nose bleeds.    Seborrheic Dermatitis, Adult Seborrheic dermatitis is a skin disease that causes red, scaly patches. It usually occurs on the scalp, and it is often called dandruff. The patches may appear on other parts of the body. Skin patches tend to appear where there are many oil glands in the skin. Areas of the body that are commonly affected include:  Scalp.  Skin folds of the body.  Ears.  Eyebrows/eyes  Neck.  Face.  Armpits.  The bearded area of men's faces.  The condition may come and go for no known reason, and it is often long-lasting (chronic). What are the causes? The cause of this condition is not known.  What are the signs or symptoms? Symptoms of this condition include:  Thick scales on the scalp.  Redness on the face or in the armpits.  Skin that is flaky. The flakes may be white or yellow.  Skin that seems oily or dry but is not helped with moisturizers.  Itching or burning in the affected areas.  How is this diagnosed? This condition is diagnosed with a medical history and physical exam. A sample of your skin may be tested (skin biopsy). You may need to see a skin specialist (dermatologist). How is this treated? There is no cure for this condition, but treatment can help to manage the symptoms. You may get treatment to remove scales, lower the risk of skin infection, and reduce swelling or itching. Treatment may include:  Creams that reduce swelling and irritation (steroids).  Creams that reduce skin yeast.  Medicated shampoo, soaps, moisturizing creams, or ointments.  Medicated moisturizing creams or ointments.  Follow these  instructions at home:  Apply over-the-counter and prescription medicines only as told by your health care provider.  Use any medicated shampoo, soaps, skin creams, or ointments only as told by your health care provider.  Keep all follow-up visits as told by your health care provider. This is important. Contact a health care provider if:  Your symptoms do not improve with treatment.  Your symptoms get worse.  You have new symptoms. This information is not intended to replace advice given to you by your health care provider. Make sure you discuss any questions you have with your health care provider. Document Released: 03/02/2005 Document Revised: 09/20/2015 Document Reviewed: 06/20/2015 Elsevier Interactive Patient Education  [employer]2018 [employer] Inc[employer]

## 2017-06-23 LAB — CBC WITH DIFFERENTIAL/PLATELET
Basophils Absolute: 28 cells/uL (ref 0–200)
Basophils Relative: 0.3 %
Eosinophils Absolute: 141 cells/uL (ref 15–500)
Eosinophils Relative: 1.5 %
HCT: 38.3 % (ref 35.0–45.0)
Hemoglobin: 12.9 g/dL (ref 11.7–15.5)
Lymphs Abs: 2284 cells/uL (ref 850–3900)
MCH: 29.7 pg (ref 27.0–33.0)
MCHC: 33.7 g/dL (ref 32.0–36.0)
MCV: 88 fL (ref 80.0–100.0)
MPV: 12.2 fL (ref 7.5–12.5)
Monocytes Relative: 6 %
Neutro Abs: 6383 cells/uL (ref 1500–7800)
Neutrophils Relative %: 67.9 %
Platelets: 221 10*3/uL (ref 140–400)
RBC: 4.35 10*6/uL (ref 3.80–5.10)
RDW: 13.1 % (ref 11.0–15.0)
Total Lymphocyte: 24.3 %
WBC mixed population: 564 cells/uL (ref 200–950)
WBC: 9.4 10*3/uL (ref 3.8–10.8)

## 2017-06-23 LAB — BASIC METABOLIC PANEL WITH GFR
BUN: 9 mg/dL (ref 7–25)
CO2: 27 mmol/L (ref 20–32)
Calcium: 9.7 mg/dL (ref 8.6–10.2)
Chloride: 103 mmol/L (ref 98–110)
Creat: 0.8 mg/dL (ref 0.50–1.10)
GFR, Est African American: 120 mL/min/{1.73_m2} (ref >=60)
GFR, Est Non African American: 104 mL/min/{1.73_m2} (ref >=60)
Glucose, Bld: 88 mg/dL (ref 65–99)
Potassium: 3.9 mmol/L (ref 3.5–5.3)
Sodium: 138 mmol/L (ref 135–146)

## 2017-06-23 LAB — HEPATIC FUNCTION PANEL
AG Ratio: 1.4 (calc) (ref 1.0–2.5)
ALT: 10 U/L (ref 6–29)
AST: 17 U/L (ref 10–30)
Albumin: 4.5 g/dL (ref 3.6–5.1)
Alkaline phosphatase (APISO): 40 U/L (ref 33–115)
Bilirubin, Direct: 0.1 mg/dL (ref 0.0–0.2)
Globulin: 3.3 g/dL (calc) (ref 1.9–3.7)
Indirect Bilirubin: 0.3 mg/dL (calc) (ref 0.2–1.2)
Total Bilirubin: 0.4 mg/dL (ref 0.2–1.2)
Total Protein: 7.8 g/dL (ref 6.1–8.1)

## 2017-06-23 LAB — LIPID PANEL
Cholesterol: 227 mg/dL — ABNORMAL HIGH (ref <200)
HDL: 50 mg/dL — ABNORMAL LOW (ref >50)
LDL Cholesterol (Calc): 149 mg/dL (calc) — ABNORMAL HIGH
Non-HDL Cholesterol (Calc): 177 mg/dL (calc) — ABNORMAL HIGH (ref <130)
Total CHOL/HDL Ratio: 4.5 (calc) (ref <5.0)
Triglycerides: 153 mg/dL — ABNORMAL HIGH (ref <150)

## 2017-06-23 LAB — TSH: TSH: 1.81 mIU/L

## 2017-06-23 LAB — VITAMIN D 25 HYDROXY (VIT D DEFICIENCY, FRACTURES): Vit D, 25-Hydroxy: 32 ng/mL (ref 30–100)

## 2017-06-24 LAB — CYTOLOGY - PAP: Diagnosis: NEGATIVE

## 2017-07-15 ENCOUNTER — Other Ambulatory Visit: Payer: Self-pay | Admitting: Physician Assistant

## 2017-07-15 DIAGNOSIS — Z30011 Encounter for initial prescription of contraceptive pills: Secondary | ICD-10-CM

## 2017-11-24 ENCOUNTER — Ambulatory Visit (INDEPENDENT_AMBULATORY_CARE_PROVIDER_SITE_OTHER): Payer: Self-pay

## 2017-11-24 ENCOUNTER — Ambulatory Visit (INDEPENDENT_AMBULATORY_CARE_PROVIDER_SITE_OTHER): Payer: BLUE CROSS/BLUE SHIELD | Admitting: Orthopaedic Surgery

## 2017-11-24 ENCOUNTER — Encounter (INDEPENDENT_AMBULATORY_CARE_PROVIDER_SITE_OTHER): Payer: Self-pay | Admitting: Orthopaedic Surgery

## 2017-11-24 DIAGNOSIS — M25532 Pain in left wrist: Secondary | ICD-10-CM | POA: Diagnosis not present

## 2017-11-24 DIAGNOSIS — M545 Low back pain: Secondary | ICD-10-CM

## 2017-11-24 DIAGNOSIS — G8929 Other chronic pain: Secondary | ICD-10-CM

## 2017-11-24 MED ORDER — METHYLPREDNISOLONE 4 MG PO TABS: ORAL_TABLET | ORAL | 0 refills | Status: DC

## 2017-11-24 MED ORDER — DICLOFENAC SODIUM 75 MG PO TBEC: 75.0000 mg | DELAYED_RELEASE_TABLET | Freq: Two times a day (BID) | ORAL | 0 refills | Status: DC | PRN

## 2017-11-24 NOTE — Progress Notes (Signed)
Office Visit Note   Patient: Beth Thomas           Date of Birth: [birth year]           MRN: [id number] Visit Date: 11/24/2017              Requested by: Lucky Cowboy, MD [address] [place], [place] [address] PCP: Lucky Cowboy, MD   Assessment & Plan: Visit Diagnoses:  1. Left wrist pain   2. Chronic low back pain, unspecified back pain laterality, with sciatica presence unspecified     Plan: We are certainly surprised with the degree of scoliosis of her lumbar spine.  She did go to a chiropractor I think for some adjustments and this may help her quite a bit.  We will try a 6-day steroid taper and some diclofenac as an anti-inflammatory.  I have her continue wear wrist splint for her left wrist.  I would like to see her back in 4 weeks for repeat exam of the wrist and her back.  If the back still issues we may end up getting an MRI of the lumbar spine.  We also may MRI of the wrist if there is improvement.  All question concerns were answered and addressed.  Follow-Up Instructions: Return in about 4 weeks (around 12/22/2017).   Orders:  Orders Placed This Encounter  Procedures  . XR Wrist Complete Left  . XR Lumbar Spine 2-3 Views   Meds ordered this encounter  Medications  . methylPREDNISolone (MEDROL) 4 MG tablet    Sig: Medrol dose pack. Take as instructed    Dispense:  21 tablet    Refill:  0  . diclofenac (VOLTAREN) 75 MG EC tablet    Sig: Take 1 tablet (75 mg total) by mouth 2 (two) times daily between meals as needed.    Dispense:  60 tablet    Refill:  0      Procedures: No procedures performed   Clinical Data: No additional findings.   Subjective: Chief Complaint  Patient presents with  . Lower Back - Pain  The patient is very pleasant and active 22 year old female who comes in with 2 chief complaints.  One is been more of a chronic issue with some low back pain that radiates into her right hip area.  She sometimes get a  throbbing and aching pain around her hip is been somewhat better but when she goes the gym and increase her activities as what bothers her the most.  She is also been dealing with left nondominant wrist pain after lifting a heavy box at work it really bother her and working on the gym really bothers her she points to the volar aspect of her wrist right in the central middle aspect of it.  She is never injured that wrist before.  She denies any numbness and tingling all and hurts mainly with the extremes of flexion extension and with lifting objects.  HPI  Review of Systems She denies any weakness in her legs.  She denies any change in bowel or bladder function.  Denies any headache, chest pain, shortness of breath, fever, chills, nausea, vomiting.  She is not a diabetic.  Objective: Vital Signs: There were no vitals taken for this visit.  Physical Exam She is alert and oriented x3 and in no acute distress Ortho Exam Examination of her hips show both having fluid range of motion with no blocks to motion but no significant pain at all.  There  is a little bit of just slight discomfort on the right side.  Her ligaments appear equal and I have her lay supine.  She has excellent strength in her bilateral lower extremities and negative straight leg raise.  Examination of her left wrist she has negative Tinel sign at the wrist.  She has excellent grip and pinch strength.  There is no pain when stressing the carpal bones or the distal radial ulnar joint.  She has some pain in the central aspect of her wrist volarly with flexion extension at the extremes. Specialty Comments:  No specialty comments available.  Imaging: Xr Wrist Complete Left  Result Date: 11/24/2017 3 views of the left wrist show no acute findings.  The alignment of the carpal bones and the major bones all appear normal.  Xr Lumbar Spine 2-3 Views  Result Date: 11/24/2017 An AP and lateral lumbar spine show significant lumbar scoliosis.   There is otherwise no acute findings.    PMFS History: Patient Active Problem List   Diagnosis Date Noted  . Anxiety 06/22/2017  . 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 Vitamin D deficiency 05/14/2015  . Other abnormal glucose 05/14/2015  . Allergic rhinitis 01/26/2013   Past Medical History:  Diagnosis Date  . Allergy   . Unspecified vitamin D deficiency     Family History  Problem Relation Age of Onset  . Hyperlipidemia Mother   . Hypertension Mother   . Diabetes Mother   . Hypertension Father   . Hyperlipidemia Father     Past Surgical History:  Procedure Laterality Date  . TONSILECTOMY/ADENOIDECTOMY WITH MYRINGOTOMY  01/2016   Social History   Occupational History  . Not on file  Tobacco Use  . Smoking status: Passive Smoke Exposure - Never Smoker  . Smokeless tobacco: Never Used  Substance and Sexual Activity  . Alcohol use: No    Alcohol/week: 0.0 standard drinks  . Drug use: No  . Sexual activity: Yes    Partners: Male    Birth control/protection: Pill

## 2017-12-06 ENCOUNTER — Other Ambulatory Visit: Payer: Self-pay

## 2017-12-06 DIAGNOSIS — Z30011 Encounter for initial prescription of contraceptive pills: Secondary | ICD-10-CM

## 2017-12-06 MED ORDER — DROSPIRENONE-ETHINYL ESTRADIOL 3-0.03 MG PO TABS: 1.0000 | ORAL_TABLET | Freq: Every day | ORAL | 2 refills | Status: DC

## 2017-12-06 MED ORDER — POLYETHYLENE GLYCOL 3350 17 GM/SCOOP PO POWD: ORAL | 0 refills | Status: DC

## 2017-12-06 NOTE — Telephone Encounter (Signed)
Refill request for Beth Thomas and Miralax.

## 2017-12-22 ENCOUNTER — Encounter (INDEPENDENT_AMBULATORY_CARE_PROVIDER_SITE_OTHER): Payer: Self-pay | Admitting: Orthopaedic Surgery

## 2017-12-22 ENCOUNTER — Ambulatory Visit (INDEPENDENT_AMBULATORY_CARE_PROVIDER_SITE_OTHER): Payer: BLUE CROSS/BLUE SHIELD | Admitting: Orthopaedic Surgery

## 2017-12-22 DIAGNOSIS — M25532 Pain in left wrist: Secondary | ICD-10-CM

## 2017-12-22 NOTE — Progress Notes (Signed)
The patient continues to have volar sided left wrist pain of her nondominant wrist is been going on for quite a while now.  We have seen her for this before.  She is tried activity modification with staying away from the gym.  This is what was bothering her the most.  She still denies any numbness tingling in her hand and denies any weakness but does report pain when she is trying to lift anything with that wrist and she points look into the volar aspect of the wrist.  She is tried a wrist splint as well and was better on anti-inflammatories.  On exam she is Palpable ulnar and radial pulses.  She has negative Tinel's and Phalen sign of the transverse carpal ligament.  She is got good grip and pinch strength but hyperextension and hyperflexion of the wrist causes central wrist pain of the volar aspect.  There is no pain with supination pronation and no pain over the ulnar styloid.  There is no palpable clunk from her wrist when moving and radial ulnar deviation.  There is no radial sided pain.  X-rays of from last visit showed no significant findings.  This point given the failure of all forms conservative treatment including activity modification we will obtain an MRI to assess the cartilage ligament and tendons of the wrist.  I do not feel this needs any arthrogram since she has no issues of ulnar-sided wrist pain and I am not looking for a TFCC injury.  We will see her back after the MRI is obtained.  All question concerns were answered and addressed.

## 2017-12-24 ENCOUNTER — Other Ambulatory Visit (INDEPENDENT_AMBULATORY_CARE_PROVIDER_SITE_OTHER): Payer: Self-pay

## 2017-12-24 DIAGNOSIS — M25532 Pain in left wrist: Secondary | ICD-10-CM

## 2018-01-05 ENCOUNTER — Ambulatory Visit (INDEPENDENT_AMBULATORY_CARE_PROVIDER_SITE_OTHER): Payer: BLUE CROSS/BLUE SHIELD | Admitting: Orthopaedic Surgery

## 2018-01-10 ENCOUNTER — Ambulatory Visit
Admission: RE | Admit: 2018-01-10 | Discharge: 2018-01-10 | Disposition: A | Discharge status: Home / Self Care | Payer: BLUE CROSS/BLUE SHIELD | Source: Ambulatory Visit | Attending: Orthopaedic Surgery | Admitting: Orthopaedic Surgery

## 2018-01-10 DIAGNOSIS — M25532 Pain in left wrist: Secondary | ICD-10-CM

## 2018-01-12 ENCOUNTER — Encounter (INDEPENDENT_AMBULATORY_CARE_PROVIDER_SITE_OTHER): Payer: Self-pay | Admitting: Orthopaedic Surgery

## 2018-01-12 ENCOUNTER — Ambulatory Visit (INDEPENDENT_AMBULATORY_CARE_PROVIDER_SITE_OTHER): Payer: BLUE CROSS/BLUE SHIELD | Admitting: Orthopaedic Surgery

## 2018-01-12 DIAGNOSIS — M25532 Pain in left wrist: Secondary | ICD-10-CM | POA: Diagnosis not present

## 2018-01-12 MED ORDER — LIDOCAINE HCL 1 % IJ SOLN
1.0000 mL | INTRAMUSCULAR | Status: AC | PRN
  Administered 2018-01-12: 1 mL

## 2018-01-12 MED ORDER — METHYLPREDNISOLONE ACETATE 40 MG/ML IJ SUSP
40.0000 mg | INTRAMUSCULAR | Status: AC | PRN
  Administered 2018-01-12: 40 mg via INTRA_ARTICULAR

## 2018-01-12 NOTE — Progress Notes (Signed)
Office Visit Note   Patient: Beth Thomas           Date of Birth: 09-Aug-1995           MRN: [id number] Visit Date: 01/12/2018              Requested by: Lucky Cowboy, MD [address] [place], [place] [address] PCP: Lucky Cowboy, MD   Assessment & Plan: Visit Diagnoses:  1. Pain in left wrist     Plan: I do feel it is worth trying a one-time intra-articular steroid injection in her left wrist.  I explained the rationale behind this and the risk and benefits involved.  She did wish to proceed with this.  She tolerated it well.  All question concerns were answered and addressed.  Follow-up will be as needed.  If she continues to have issues with the wrist she will let us know because I would try outpatient hand therapy.  Follow-Up Instructions: Return if symptoms worsen or fail to improve.   Orders:  Orders Placed This Encounter  Procedures  . Medium Joint Inj   No orders of the defined types were placed in this encounter.     Procedures: Medium Joint Inj: L radiocarpal on 01/12/2018 2:41 PM Medications: 1 mL lidocaine 1 %; 40 mg methylPREDNISolone acetate 40 MG/ML      Clinical Data: No additional findings.   Subjective: Chief Complaint  Patient presents with  . Left Wrist - Follow-up  The patient comes in with continued left wrist pain.  We did obtain an MRI to evaluate her left wrist after continued pain and failure of conservative treatment measures.  She has not had an injection.  Still the volar aspect of her wrist where she hurts the most.  It mainly hurts with weight lifting other activities.  She is only 22 years old.  This is her nondominant wrist.  She denies any numbness and tingling in her hand.  The area where she is most tender is proximal to the wrist crease.  This is been going on for a few months now.  HPI  Review of Systems  She currently denies any fever, chills, nausea, vomiting. Objective: Vital Signs: There were  no vitals taken for this visit.  Physical Exam She is alert and oriented no acute distress Ortho Exam Examination of her left wrist shows full range of motion and excellent strength.  She is neurovascularly intact.  Her pain is with extreme of palmar flexion of the left wrist. Specialty Comments:  No specialty comments available.  Imaging: No results found. The MRI of her left wrist is independently reviewed.  It is a normal-appearing wrist with no acute findings.  This is been reviewed extensively.  PMFS History: Patient Active Problem List   Diagnosis Date Noted  . Anxiety 06/22/2017  . 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 Vitamin D deficiency 05/14/2015  . Other abnormal glucose 05/14/2015  . Allergic rhinitis 01/26/2013   Past Medical History:  Diagnosis Date  . Allergy   . Unspecified vitamin D deficiency     Family History  Problem Relation Age of Onset  . Hyperlipidemia Mother   . Hypertension Mother   . Diabetes Mother   . Hypertension Father   . Hyperlipidemia Father     Past Surgical History:  Procedure Laterality Date  . TONSILECTOMY/ADENOIDECTOMY WITH MYRINGOTOMY  01/2016   Social History   Occupational History  . Not on file  Tobacco Use  . Smoking status:  Passive Smoke Exposure - Never Smoker  . Smokeless tobacco: Never Used  Substance and Sexual Activity  . Alcohol use: No    Alcohol/week: 0.0 standard drinks  . Drug use: No  . Sexual activity: Yes    Partners: Male    Birth control/protection: Pill

## 2018-02-23 ENCOUNTER — Ambulatory Visit (INDEPENDENT_AMBULATORY_CARE_PROVIDER_SITE_OTHER): Payer: BLUE CROSS/BLUE SHIELD | Admitting: Orthopaedic Surgery

## 2018-02-23 ENCOUNTER — Ambulatory Visit (INDEPENDENT_AMBULATORY_CARE_PROVIDER_SITE_OTHER): Payer: BLUE CROSS/BLUE SHIELD

## 2018-02-23 ENCOUNTER — Encounter (INDEPENDENT_AMBULATORY_CARE_PROVIDER_SITE_OTHER): Payer: Self-pay | Admitting: Orthopaedic Surgery

## 2018-02-23 DIAGNOSIS — M5441 Lumbago with sciatica, right side: Secondary | ICD-10-CM

## 2018-02-23 MED ORDER — CYCLOBENZAPRINE HCL 10 MG PO TABS: 10.0000 mg | ORAL_TABLET | Freq: Three times a day (TID) | ORAL | 0 refills | Status: DC | PRN

## 2018-02-23 NOTE — Progress Notes (Signed)
Office Visit Note   Patient: Beth Thomas           Date of Birth: 1996/01/21           MRN: [id number] Visit Date: 02/23/2018              Requested by: Lucky Cowboy, MD [address] [place], [place] [address] PCP: Lucky Cowboy, MD   Assessment & Plan: Visit Diagnoses:  1. Acute right-sided low back pain with right-sided sciatica     Plan: We will obtain an MRI due to her recurrent low back pain and the new onset of tingling down the hip.  Have her follow-up after the MRI to go over results and discuss further treatment.  In regards to medications she is told that she can take ibuprofen or diclofenac but not both.  She is given a prescription for Flexeril.  Follow-Up Instructions: Return for AFTER MRI.   Orders:  Orders Placed This Encounter  Procedures  . XR Lumbar Spine 2-3 Views   Meds ordered this encounter  Medications  . cyclobenzaprine (FLEXERIL) 10 MG tablet    Sig: Take 1 tablet (10 mg total) by mouth 3 (three) times daily as needed for muscle spasms.    Dispense:  30 tablet    Refill:  0      Procedures: No procedures performed   Clinical Data: No additional findings.   Subjective: Chief Complaint  Patient presents with  . Lower Back - Pain    HPI  Beth Thomas returns today due to low back pain that is been ongoing for the past week and a half.  No known injury.  Pain is radiating to her right hip she describes this as numbness tingling right hip.  Pains in her low back in the right paraspinous region.  She had low back pain back in September and at that time was having throbbing pain about the right hip but no tingling.  She otherwise denies any radicular symptoms down either leg.  She denies any saddle anesthesia like symptoms, bowel or bladder dysfunction, dysuria, fevers or chills.  She states that her pain now is always present with the pain being 10 out of 10 pain at worst.  Review of Systems Please see HPI otherwise  negative  Objective: Vital Signs: There were no vitals taken for this visit.  Physical Exam  Constitutional: She is oriented to person, place, and time. She appears well-developed and well-nourished. No distress.  Cardiovascular: Intact distal pulses.  Pulmonary/Chest: Effort normal.  Neurological: She is alert and oriented to person, place, and time.  Skin: She is not diaphoretic.  Psychiatric: She has a normal mood and affect.    Ortho Exam Lower extremity she has 5 out of 5 strength throughout the lower extremities against resistance.  Sensation grossly intact bilateral feet.  Positive straight leg raise on the right negative on the left.  Deep tendon reflexes are 2+ at the knees and ankles and equal and symmetric. Specialty Comments:  No specialty comments available.  Imaging: No results found.   PMFS History: Patient Active Problem List   Diagnosis Date Noted  . Anxiety 06/22/2017  . 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 Vitamin D deficiency 05/14/2015  . Other abnormal glucose 05/14/2015  . Allergic rhinitis 01/26/2013   Past Medical History:  Diagnosis Date  . Allergy   . Unspecified vitamin D deficiency     Family History  Problem Relation Age of Onset  . Hyperlipidemia Mother   .  Hypertension Mother   . Diabetes Mother   . Hypertension Father   . Hyperlipidemia Father     Past Surgical History:  Procedure Laterality Date  . TONSILECTOMY/ADENOIDECTOMY WITH MYRINGOTOMY  01/2016   Social History   Occupational History  . Not on file  Tobacco Use  . Smoking status: Passive Smoke Exposure - Never Smoker  . Smokeless tobacco: Never Used  Substance and Sexual Activity  . Alcohol use: No    Alcohol/week: 0.0 standard drinks  . Drug use: No  . Sexual activity: Yes    Partners: Male    Birth control/protection: Pill

## 2018-02-24 ENCOUNTER — Other Ambulatory Visit (INDEPENDENT_AMBULATORY_CARE_PROVIDER_SITE_OTHER): Payer: Self-pay

## 2018-02-24 DIAGNOSIS — M4807 Spinal stenosis, lumbosacral region: Secondary | ICD-10-CM

## 2018-03-12 ENCOUNTER — Ambulatory Visit
Admission: RE | Admit: 2018-03-12 | Discharge: 2018-03-12 | Disposition: A | Discharge status: Home / Self Care | Payer: BLUE CROSS/BLUE SHIELD | Source: Ambulatory Visit | Attending: Physician Assistant | Admitting: Physician Assistant

## 2018-03-12 DIAGNOSIS — M4807 Spinal stenosis, lumbosacral region: Secondary | ICD-10-CM

## 2018-04-04 ENCOUNTER — Encounter (INDEPENDENT_AMBULATORY_CARE_PROVIDER_SITE_OTHER): Payer: Self-pay | Admitting: Orthopaedic Surgery

## 2018-04-04 ENCOUNTER — Other Ambulatory Visit: Payer: Self-pay | Admitting: Internal Medicine

## 2018-04-04 ENCOUNTER — Ambulatory Visit (INDEPENDENT_AMBULATORY_CARE_PROVIDER_SITE_OTHER): Payer: BLUE CROSS/BLUE SHIELD | Admitting: Orthopaedic Surgery

## 2018-04-04 DIAGNOSIS — M5441 Lumbago with sciatica, right side: Secondary | ICD-10-CM | POA: Diagnosis not present

## 2018-04-04 NOTE — Progress Notes (Signed)
The patient comes in today to go over an MRI of her lumbar spine.  When she came in the last visit she had acute right-sided low back pain with sciatica.  Similar symptoms are still around her right hip and sciatic region.  She denies any numbness tingling in her feet.  On exam she still has a positive straight leg raise on the right side but no weakness in the bilateral lower extremities and good strength.  The MRI does show a small disc protrusion to the right side at L5-S1 with no neural impingement and is very small but certainly this can be irritating the nerve.  I gave her reassurance that I do not think she needs a surgical referral.  I do feel that she would benefit from physical therapy for McKenzie exercises and back extension exercises as well as traction.  Worse case scenario would be an epidural steroid injection.  She agrees with this treatment plan.  We will work on setting up outpatient physical therapy for her.  We will see her back in a month to make sure everything is doing well.

## 2018-04-05 ENCOUNTER — Other Ambulatory Visit (INDEPENDENT_AMBULATORY_CARE_PROVIDER_SITE_OTHER): Payer: Self-pay

## 2018-04-05 DIAGNOSIS — M5441 Lumbago with sciatica, right side: Secondary | ICD-10-CM

## 2018-04-19 ENCOUNTER — Ambulatory Visit: Payer: BLUE CROSS/BLUE SHIELD | Attending: Orthopaedic Surgery | Admitting: Physical Therapy

## 2018-04-19 ENCOUNTER — Other Ambulatory Visit: Payer: Self-pay

## 2018-04-19 ENCOUNTER — Encounter: Payer: Self-pay | Admitting: Physical Therapy

## 2018-04-19 DIAGNOSIS — M5441 Lumbago with sciatica, right side: Secondary | ICD-10-CM

## 2018-04-19 DIAGNOSIS — M6281 Muscle weakness (generalized): Secondary | ICD-10-CM

## 2018-04-19 NOTE — Therapy (Signed)
[facility] [address] [place], [place], [address] Phone: [phone]   Fax:  [phone]  Physical Therapy Evaluation  Patient Details  Name: Beth Thomas MRN: [id number] Date of Birth: 05-13-1995 Referring Provider (PT): Doneen Poisson, MD   Encounter Date: 04/19/2018  PT End of Session - 04/19/18 2101    Visit Number  1    Number of Visits  10    Date for PT Re-Evaluation  05/31/18    Authorization Type  BCBS    PT Start Time  1716    PT Stop Time  1806    PT Time Calculation (min)  50 min    Activity Tolerance  Patient tolerated treatment well    Behavior During Therapy  [facility] for tasks assessed/performed       Past Medical History:  Diagnosis Date  . Allergy   . Unspecified vitamin D deficiency     Past Surgical History:  Procedure Laterality Date  . TONSILECTOMY/ADENOIDECTOMY WITH MYRINGOTOMY  01/2016    There were no vitals filed for this visit.   Subjective Assessment - 04/19/18 2048    Subjective  Pt. is a 23 y/o female with c/o LBP with radiating pain to right lateral hip region, onset late 11/19. No specific mechanism of injury noted but attributes possible association of onset with gym exercises at the time. Previous incidence of symptoms aroung September 2019 which had resolved. Pt. had MRI which revealed small L5-S1 disc protrusion but was (-) neural impingement. PT referral with plans to include McKenzie/extension exercises and traction.    Pertinent History  Previous LBP 9/19 resolved at the time, MRI findings small L5-S1 disc protrusion but (-) neural impingement, mild lumbar scoliosis (left convex) noted with imaging    Limitations  Standing;House hold activities;Lifting    How long can you sit comfortably?  1 hour    How long can you stand comfortably?  20 minutes if standing still    How long can you walk comfortably?  no limitations    Diagnostic tests  MRI, X-rays    Currently in Pain?   Yes    Pain Score  1     Pain Location  Back    Pain Orientation  Right;Lower    Pain Descriptors / Indicators  Dull    Pain Type  Acute pain    Pain Radiating Towards  Right lateral hip    Pain Onset  More than a month ago    Pain Frequency  Intermittent    Aggravating Factors   standing, varies with activity    Pain Relieving Factors  medication, rest    Effect of Pain on Daily Activities  Limits positional tolerance for standing for IADLs, work as Geophysicist/field seismologist         [facility] PT Assessment - 04/19/18 0001      Assessment   Medical Diagnosis  Acute LBP with right sciatica    Referring Provider (PT)  Doneen Poisson, MD    Onset Date/Surgical Date  02/08/19   estimated per report onset late Nov. 2019   Hand Dominance  Right    Prior Therapy  none      Precautions   Precautions  None      Restrictions   Weight Bearing Restrictions  No      Balance Screen   Has the patient fallen in the past 6 months  No      Cognition   Overall Cognitive Status  Within Functional  Limits for tasks assessed      Observation/Other Assessments   Focus on Therapeutic Outcomes (FOTO)   28% limited      Sensation   Light Touch  --   grossly intact at bilat. L2-S2 dermatomes     Posture/Postural Control   Posture Comments  Slight LLD with right LE>left, no change in LLD with longsitting test, suspect potentially associated with scoliosis      ROM / Strength   AROM / PROM / Strength  AROM;Strength      AROM   Overall AROM Comments  Bilat. hip AROM/PROM grossly [facility]    AROM Assessment Site  Lumbar    Lumbar Flexion  95    Lumbar Extension  35    Lumbar - Right Side Bend  38    Lumbar - Left Side Bend  38    Lumbar - Right Rotation  WFL    Lumbar - Left Rotation  [facility]      Strength   Strength Assessment Site  Hip;Knee;Ankle    Right/Left Hip  Right;Left    Right Hip Flexion  4+/5    Right Hip Extension  4/5    Right Hip External Rotation   4+/5    Right Hip Internal  Rotation  5/5    Right Hip ABduction  4+/5    Left Hip Flexion  5/5    Left Hip Extension  4/5    Left Hip External Rotation  5/5    Left Hip Internal Rotation  5/5    Left Hip ABduction  5/5    Right/Left Knee  Right;Left    Right Knee Flexion  5/5    Right Knee Extension  5/5    Left Knee Flexion  5/5    Left Knee Extension  5/5    Right/Left Ankle  Right;Left    Right Ankle Dorsiflexion  5/5    Right Ankle Inversion  5/5    Right Ankle Eversion  5/5    Left Ankle Dorsiflexion  5/5    Left Ankle Inversion  5/5    Left Ankle Eversion  5/5      Flexibility   Soft Tissue Assessment /Muscle Length  --   mild hamstring tightness with SLR 80 deg     Palpation   Spinal mobility  --   no hypomobility with lumbar PAs   Palpation comment  Tight with TTP/symptom reproduction right gluteus medius, tight with TTP right lower lumbar paraspinals      Special Tests   Other special tests  SLR and slump (-)                Objective measurements completed on examination: See above findings.      [facility] Adult PT Treatment/Exercise - 04/19/18 0001      Exercises   Exercises  Lumbar      Lumbar Exercises: Stretches   Other Lumbar Stretch Exercise  HEP instruction prone press up/extension, right IT band and piriformis stretches, hip bridge      Modalities   Modalities  Traction      Traction   Type of Traction  Lumbar    Min (lbs)  0    Max (lbs)  50    Hold Time  60 sec    Rest Time  2 sec    Time  10 min       Trigger Point Dry Needling - 04/19/18 2101    Consent Given?  Yes  Education Handout Provided  Yes    Muscles Treated Lower Body  Gluteus minimus   incl. gluteus medius   Gluteus Minimus Response  Twitch response elicited             PT Short Term Goals - 04/19/18 2111      PT SHORT TERM GOAL #1   Title  Independent with HEP    Baseline  no HEP    Time  3    Period  Weeks    Status  New      PT SHORT TERM GOAL #2   Title  Centralize  right LE pain to lumbar region for progression to LTGs    Baseline  pain into right lateral hip    Time  3    Period  Weeks    Status  New        PT Long Term Goals - 04/19/18 2112      PT LONG TERM GOAL #1   Title  Improve FOTO score to 22% or less impairment    Baseline  28% limited    Time  6    Period  Weeks    Status  New    Target Date  05/31/18      PT LONG TERM GOAL #2   Title  Increase right hip strength to 5/5 to improve ability for lifting for chores and improved mechanics for gym exercises to decrease lumbar strain    Baseline  grossly 4 to 4+/5-see flowsheet    Time  6    Period  Weeks    Status  New    Target Date  05/31/18      PT LONG TERM GOAL #3   Title  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 presents with approximately 2 1/2 month history of right lumbar and lateral hip pain with MRI findings of small disc protrusion. Instructed in extension ROM and trial traction today to address potential discogenic component of symptoms. Suspect contributing muscular/myofascial component to lateral hip and lumbar pain as welll with associated muscle tightness. Pt. would benefit from PT to address current associated functional limitations.    History and Personal Factors relevant to plan of care:  duration symptoms, previous incidence 9/19, MRI findings    Clinical Presentation  Stable    Clinical Decision Making  Low    Rehab Potential  Excellent    Clinical Impairments Affecting Rehab Potential  young age, no signficant  comorbidities    PT Frequency  --   1-2x/week   PT Duration  6 weeks    PT Treatment/Interventions  ADLs/Self Care Home Management;Cryotherapy;Ultrasound;Traction;Moist Heat;Electrical Stimulation;Neuromuscular re-education;Therapeutic exercise;Therapeutic activities;Patient/family education;Manual techniques;Dry needling;Taping    PT Next Visit Plan  Check response traction and increase up t0 50% bodyweight as tolerated.  Review/progress extension ROM and extension bias stabilization as tolerated, right hip stretches, lumbar and right posterolateral hip STM and further dry needling as found beneficial    PT Home Exercise Plan  prone press up, right IT band and piriformis stretches, hip bridge    Consulted and Agree with Plan of Care  Patient       Patient will benefit from skilled therapeutic intervention in order to improve the following deficits and impairments:  Pain, Improper body mechanics, Increased muscle spasms, Impaired flexibility, Decreased strength  Visit Diagnosis: Acute bilateral low back pain with right-sided sciatica  Muscle weakness (generalized)     Problem List Patient Active Problem List   Diagnosis Date Noted  . Anxiety 06/22/2017  . 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 Vitamin D deficiency 05/14/2015  . Other abnormal glucose 05/14/2015  . Allergic rhinitis 01/26/2013    Lazarus Gowdahristopher Iona Stay, PT, DPT 04/19/18 9:18 PM  [facility] [address] [place], [place], [id number] Phone: [phone]   Fax:  [phone]  Name: Beth Thomas MRN: [id number] Date of Birth: 08-Sep-1995

## 2018-04-19 NOTE — Patient Instructions (Signed)

## 2018-05-02 ENCOUNTER — Ambulatory Visit: Payer: BLUE CROSS/BLUE SHIELD | Admitting: Physical Therapy

## 2018-05-02 ENCOUNTER — Encounter: Payer: Self-pay | Admitting: Physical Therapy

## 2018-05-02 DIAGNOSIS — M5441 Lumbago with sciatica, right side: Secondary | ICD-10-CM | POA: Diagnosis not present

## 2018-05-02 DIAGNOSIS — M6281 Muscle weakness (generalized): Secondary | ICD-10-CM

## 2018-05-02 NOTE — Therapy (Signed)
[facility]patient [facility] [address] [place], [place], [address] Phone: [phone]   Fax:  [phone]  Physical Therapy Treatment  Patient Details  Name: Beth Thomas MRN: [id number] Date of Birth: Apr 26, 1995 Referring Provider (PT): Doneen Poisson, MD   Encounter Date: 05/02/2018  PT End of Session - 05/02/18 0904    Visit Number  2    Number of Visits  10    Date for PT Re-Evaluation  05/31/18    Authorization Type  BCBS    PT Start Time  0905   pt arrived 20 min late   PT Stop Time  0938    PT Time Calculation (min)  33 min    Activity Tolerance  Patient tolerated treatment well    Behavior During Therapy  [facility] for tasks assessed/performed       Past Medical History:  Diagnosis Date  . Allergy   . Unspecified vitamin D deficiency     Past Surgical History:  Procedure Laterality Date  . TONSILECTOMY/ADENOIDECTOMY WITH MYRINGOTOMY  01/2016    There were no vitals filed for this visit.  Subjective Assessment - 05/02/18 0905    Subjective  "I am doing about the same the pain is staying in the back, I happy with that"     Patient Stated Goals  Learn to minimize pain/if doing activities that exacerbate pain    Currently in Pain?  Yes    Pain Score  4     Pain Location  Back    Pain Orientation  Right    Pain Descriptors / Indicators  Aching    Pain Frequency  Intermittent    Aggravating Factors   standing, varies    Pain Relieving Factors  medication, rest                       [facility] Adult PT Treatment/Exercise - 05/02/18 0001      Lumbar Exercises: Stretches   Quadruped Mid Back Stretch  2 reps;30 seconds   childs pose     Lumbar Exercises: Supine   Dead Bug  --    2 x 10 sec hold progressing to laternating UE/LE   Dead Bug Limitations  cues to keep back flat during exercise.      Traction   Type of Traction  Lumbar    Min (lbs)  60    Max (lbs)  80    Hold Time  60 sec    Rest Time   10    Time  10      Manual Therapy   Manual therapy comments  MTPR along the R lumbar paraspinals             PT Education - 05/02/18 0925    Education Details  reviewed HEP and progressed prone extension and stretching    Person(s) Educated  Patient    Methods  Explanation;Verbal cues;Handout    Comprehension  Verbalized understanding;Verbal cues required       PT Short Term Goals - 04/19/18 2111      PT SHORT TERM GOAL #1   Title  Independent with HEP    Baseline  no HEP    Time  3    Period  Weeks    Status  New      PT SHORT TERM GOAL #2   Title  Centralize right LE pain to lumbar region for progression to LTGs    Baseline  pain into right lateral  hip    Time  3    Period  Weeks    Status  New        PT Long Term Goals - 04/19/18 2112      PT LONG TERM GOAL #1   Title  Improve FOTO score to 22% or less impairment    Baseline  28% limited    Time  6    Period  Weeks    Status  New    Target Date  05/31/18      PT LONG TERM GOAL #2   Title  Increase right hip strength to 5/5 to improve ability for lifting for chores and improved mechanics for gym exercises to decrease lumbar strain    Baseline  grossly 4 to 4+/5-see flowsheet    Time  6    Period  Weeks    Status  New    Target Date  05/31/18      PT LONG TERM GOAL #3   Title  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 due to limited time opted to hold off on TPDN and utilize MTPR along the L lumbar paraspinal. continued extension  biased progressing to breathing out with sag. she noted improvement in traction and contnued today with progression got 50% body weight which she noted felt good.     PT Treatment/Interventions  ADLs/Self Care Home Management;Cryotherapy;Ultrasound;Traction;Moist Heat;Electrical Stimulation;Neuromuscular re-education;Therapeutic exercise;Therapeutic activities;Patient/family education;Manual techniques;Dry needling;Taping    PT Next Visit Plan  response to increasing traction to 50% bodyweight  Review/progress extension ROM and extension bias stabilization as tolerated, right hip stretches, lumbar and right posterolateral hip STM and further dry needling as found beneficial    PT Home Exercise Plan  prone press up, right IT band and piriformis stretches, hip bridge    Consulted and Agree with Plan of Care  Patient       Patient will benefit from skilled therapeutic intervention in order to improve the following deficits and impairments:  Pain, Improper body mechanics, Increased muscle spasms, Impaired flexibility, Decreased strength  Visit Diagnosis: Acute bilateral low back pain with right-sided sciatica  Muscle weakness (generalized)     Problem List Patient Active Problem List   Diagnosis Date Noted  . Anxiety 06/22/2017  . 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 Vitamin D deficiency 05/14/2015  . Other abnormal glucose 05/14/2015  . Allergic rhinitis 01/26/2013   Lulu Riding PT, DPT, LAT, ATC  05/02/18  9:32 AM      [facility] [address] [place], [place], [address] Phone: [phone]   Fax:  [phone]  Name: Beth Thomas MRN: [id number] Date of Birth: 05-27-1995

## 2018-05-04 ENCOUNTER — Ambulatory Visit (INDEPENDENT_AMBULATORY_CARE_PROVIDER_SITE_OTHER): Payer: BLUE CROSS/BLUE SHIELD | Admitting: Orthopaedic Surgery

## 2018-05-04 DIAGNOSIS — M5441 Lumbago with sciatica, right side: Secondary | ICD-10-CM | POA: Diagnosis not present

## 2018-05-04 NOTE — Progress Notes (Signed)
The patient is following up after having physical therapy of the lumbar spine.  She says is helped greatly.  She still has some discomfort but is not bad enough to warrant any type of injection.  The pain she is having going down her legs is gone.  On exam today she does has pain only in the lower aspect of her lumbar spine more the left than the right and there is a trigger point in this area.  She says therapy is told her how she can do with this and told her some things.  She has negative straight leg raise bilaterally and excellent strength in the bilateral lower extremities.  At this point I would not recommend anything else other than potentially an injection in her back if he gets worse bothering her again.  All question concerns were answered and addressed.  Follow-up will be as needed.

## 2018-05-05 ENCOUNTER — Ambulatory Visit: Payer: BLUE CROSS/BLUE SHIELD | Admitting: Physical Therapy

## 2018-05-05 ENCOUNTER — Encounter: Payer: Self-pay | Admitting: Physical Therapy

## 2018-05-05 DIAGNOSIS — M5441 Lumbago with sciatica, right side: Secondary | ICD-10-CM

## 2018-05-05 DIAGNOSIS — M6281 Muscle weakness (generalized): Secondary | ICD-10-CM

## 2018-05-05 NOTE — Therapy (Signed)
[facility] [address] [place], [place], [address] Phone: [phone]   Fax:  [phone]  Physical Therapy Treatment  Patient Details  Name: Beth Thomas MRN: [id number] Date of Birth: 1995-12-26 Referring Provider (PT): Doneen Poisson, MD   Encounter Date: 05/05/2018  PT End of Session - 05/05/18 1625    Visit Number  3    Number of Visits  10    Date for PT Re-Evaluation  05/31/18    Authorization Type  BCBS    PT Start Time  1537    PT Stop Time  1638    PT Time Calculation (min)  61 min    Activity Tolerance  Patient tolerated treatment well    Behavior During Therapy  [facility] for tasks assessed/performed       Past Medical History:  Diagnosis Date  . Allergy   . Unspecified vitamin D deficiency     Past Surgical History:  Procedure Laterality Date  . TONSILECTOMY/ADENOIDECTOMY WITH MYRINGOTOMY  01/2016    There were no vitals filed for this visit.  Subjective Assessment - 05/05/18 1624    Subjective  No pain pre-tx. but noting some recent left lumbar discomfort which has been primary area of pain recently. No further LE radiating/radicular symptoms noted.    Currently in Pain?  No/denies    Pain Score  0-No pain         [facility] PT Assessment - 05/05/18 0001      Strength   Right Hip Flexion  4+/5    Right Hip Extension  4/5    Right Hip External Rotation   5/5    Right Hip Internal Rotation  5/5    Right Hip ABduction  4+/5    Left Hip Flexion  5/5    Left Hip Extension  4/5    Left Hip External Rotation  5/5    Left Hip Internal Rotation  5/5    Left Hip ABduction  5/5                   [facility] Adult PT Treatment/Exercise - 05/05/18 0001      Lumbar Exercises: Stretches   Quadruped Mid Back Stretch  --   child's pose 20 sec x 3   Other Lumbar Stretch Exercise  right LTR with legs on 55 cm ball x 10 reps for left lumbar stretch      Lumbar Exercises: Supine   Clam  20 reps     Clam Limitations  Green Theraband    Dead Bug  --   brief HEP review   Bridge with [employer]  20 reps      Traction   Type of Traction  Lumbar    Min (lbs)  60    Max (lbs)  80    Hold Time  60 sec    Rest Time  20 sec    Time  15      Manual Therapy   Manual Therapy  Soft tissue mobilization;Myofascial release    Soft tissue mobilization  Left lumbar paraspinals    Myofascial Release  left lumbar paraspinals       Trigger Point Dry Needling - 05/05/18 1612    Consent Given?  Yes    Muscles Treated Upper Body  Longissimus   left side at L3-4 region          PT Education - 05/05/18 1625    Education Details  HEP  Person(s) Educated  Patient    Methods  Explanation;Demonstration    Comprehension  Verbalized understanding       PT Short Term Goals - 04/19/18 2111      PT SHORT TERM GOAL #1   Title  Independent with HEP    Baseline  no HEP    Time  3    Period  Weeks    Status  New      PT SHORT TERM GOAL #2   Title  Centralize right LE pain to lumbar region for progression to LTGs    Baseline  pain into right lateral hip    Time  3    Period  Weeks    Status  New        PT Long Term Goals - 05/05/18 1627      PT LONG TERM GOAL #1   Title  Improve FOTO score to 22% or less impairment    Baseline  28% limited at eval, not retested today    Time  6    Period  Weeks    Status  On-going      PT LONG TERM GOAL #2   Title  Increase right hip strength to 5/5 to improve ability for lifting for chores and improved mechanics for gym exercises to decrease lumbar strain    Baseline  see flowsheet, ongoing    Time  6    Period  Weeks    Status  On-going      PT LONG TERM GOAL #3   Title  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 improving from baseline with centralization of lumbar symptoms and decreased overall pain with associated functional gains for positional + activity tolerance. Still with some weakness but expect progress with strengthening will be gradual/take more time.    Rehab Potential  Excellent    Clinical Impairments Affecting Rehab Potential  young age, no signficant comorbidities    PT Frequency  --   1-2x/week   PT Duration  6 weeks    PT Treatment/Interventions  ADLs/Self Care Home Management;Cryotherapy;Ultrasound;Traction;Moist Heat;Electrical Stimulation;Neuromuscular re-education;Therapeutic exercise;Therapeutic activities;Patient/family education;Manual techniques;Dry needling;Taping    PT Next Visit Plan  continue traction, continue STM, further dry needling as needed, progress core + hip stabilization as tolerated.    PT Home Exercise Plan  prone press up, right IT band and piriformis stretches, hip bridge, dead bugs    Consulted and Agree with Plan of Care  Patient       Patient will benefit from skilled therapeutic intervention in order to improve the following deficits and impairments:  Pain, Improper body mechanics, Increased muscle spasms, Impaired flexibility, Decreased strength  Visit Diagnosis: Acute bilateral low back pain with right-sided sciatica  Muscle weakness (generalized)     Problem List Patient Active Problem List  Diagnosis Date Noted  . Anxiety 06/22/2017  . 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 Vitamin D deficiency 05/14/2015  . Other abnormal glucose 05/14/2015  . Allergic rhinitis 01/26/2013    Lazarus Gowda, PT, DPT 05/05/18 4:39 PM  [facility] [address] [place], [place], [address] Phone: [phone]   Fax:  [phone]  Name: Beth Thomas MRN: [id number] Date of Birth: [birth year]

## 2018-05-09 ENCOUNTER — Ambulatory Visit: Payer: BLUE CROSS/BLUE SHIELD | Admitting: Physical Therapy

## 2018-05-12 ENCOUNTER — Encounter: Payer: Self-pay | Admitting: Physical Therapy

## 2018-05-12 ENCOUNTER — Ambulatory Visit: Payer: BLUE CROSS/BLUE SHIELD | Admitting: Physical Therapy

## 2018-05-12 DIAGNOSIS — M6281 Muscle weakness (generalized): Secondary | ICD-10-CM

## 2018-05-12 DIAGNOSIS — M5441 Lumbago with sciatica, right side: Secondary | ICD-10-CM

## 2018-05-12 NOTE — Therapy (Signed)
[facility] Out[facility] Rehabilitation [facility] [address] [place], [place], [address] Phone: [phone]   Fax:  [phone]  Physical Therapy Treatment  Patient Details  Name: Beth Thomas MRN: [id number] Date of Birth: September 30, 1995 Referring Provider (PT): Doneen Poisson, MD   Encounter Date: 05/12/2018  PT End of Session - 05/12/18 1627    Visit Number  4    Number of Visits  10    Date for PT Re-Evaluation  05/31/18    Authorization Type  BCBS    PT Start Time  1549    PT Stop Time  1642    PT Time Calculation (min)  53 min    Activity Tolerance  Patient tolerated treatment well    Behavior During Therapy  [id number] for tasks assessed/performed       Past Medical History:  Diagnosis Date  . Allergy   . Unspecified vitamin D deficiency     Past Surgical History:  Procedure Laterality Date  . TONSILECTOMY/ADENOIDECTOMY WITH MYRINGOTOMY  01/2016    There were no vitals filed for this visit.  Subjective Assessment - 05/12/18 1554    Subjective  Pt. reports still has tendency for pain to be exacerbated with gyme exercises (such as kettlebell exercises in HIIT class). No pain pre-tx.    Currently in Pain?  No/denies    Pain Score  0-No pain                       [facility] Adult PT Treatment/Exercise - 05/12/18 0001      Lumbar Exercises: Aerobic   Elliptical  L1 x 5 min      Lumbar Exercises: Standing   Other Standing Lumbar Exercises  KB squat to low table 10 lbs. x 15, Sumo deadloift x 10 reps      Lumbar Exercises: Quadruped   Opposite Arm/Leg Raise  15 reps    Plank  --   2x20 sec     Traction   Type of Traction  Lumbar    Min (lbs)  60    Max (lbs)  80    Hold Time  60 sec    Rest Time  20 sec    Time  15      Manual Therapy   Soft tissue mobilization  left lumbar paraspinals       Trigger Point Dry Needling - 05/12/18 1626    Consent Given?  Yes    Muscles Treated Upper Body  Longissimus   Left longissimus  and multifidi L4-5 region          PT Education - 05/12/18 1627    Education Details  exercises, HEP, avoid KB swings    Person(s) Educated  Patient    Methods  Explanation;Demonstration;Verbal cues    Comprehension  Verbalized understanding;Returned demonstration       PT Short Term Goals - 04/19/18 2111      PT SHORT TERM GOAL #1   Title  Independent with HEP    Baseline  no HEP    Time  3    Period  Weeks    Status  New      PT SHORT TERM GOAL #2   Title  Centralize right LE pain to lumbar region for progression to LTGs    Baseline  pain into right lateral hip    Time  3    Period  Weeks    Status  New        PT  Long Term Goals - 05/05/18 1627      PT LONG TERM GOAL #1   Title  Improve FOTO score to 22% or less impairment    Baseline  28% limited at eval, not retested today    Time  6    Period  Weeks    Status  On-going      PT LONG TERM GOAL #2   Title  Increase right hip strength to 5/5 to improve ability for lifting for chores and improved mechanics for gym exercises to decrease lumbar strain    Baseline  see flowsheet, ongoing    Time  6    Period  Weeks    Status  On-going      PT LONG TERM GOAL #3   Title  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 eductaion exercises today) but still improving from baseline with decreased LBP and associated functional gains.    Rehab  Potential  Excellent    Clinical Impairments Affecting Rehab Potential  young age, no signficant comorbidities    PT Frequency  --   1-2x/week   PT Duration  6 weeks    PT Treatment/Interventions  ADLs/Self Care Home Management;Cryotherapy;Ultrasound;Traction;Moist Heat;Electrical Stimulation;Neuromuscular re-education;Therapeutic exercise;Therapeutic activities;Patient/family education;Manual techniques;Dry needling;Taping    PT Next Visit Plan  continue traction, continue STM, further dry needling as needed, progress core + hip stabilization as tolerated.    PT Home Exercise Plan  prone press up, right IT band and piriformis stretches, hip bridge, dead bugs, quadruped raises, planks    Consulted and Agree with Plan of Care  Patient       Patient will benefit from skilled therapeutic intervention in order to improve the following deficits and impairments:  Pain, Improper body mechanics, Increased muscle spasms, Impaired flexibility, Decreased strength  Visit Diagnosis: Acute bilateral low back pain with right-sided sciatica  Muscle weakness (generalized)     Problem List Patient Active Problem List   Diagnosis Date Noted  . Anxiety 06/22/2017  . 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 Vitamin D deficiency 05/14/2015  . Other abnormal glucose 05/14/2015  . Allergic rhinitis 01/26/2013    Lazarus Gowda, PT, DPT 05/12/18 4:30 PM  [facility] [address] [place], [place], [address] Phone: [phone]   Fax:  [phone]  Name: Beth Thomas MRN: [id number] Date of Birth: December 12, 1995

## 2018-05-16 ENCOUNTER — Encounter: Payer: Self-pay | Admitting: Physical Therapy

## 2018-05-16 ENCOUNTER — Ambulatory Visit: Payer: BLUE CROSS/BLUE SHIELD | Attending: Orthopaedic Surgery | Admitting: Physical Therapy

## 2018-05-16 DIAGNOSIS — M5441 Lumbago with sciatica, right side: Secondary | ICD-10-CM | POA: Insufficient documentation

## 2018-05-16 DIAGNOSIS — M6281 Muscle weakness (generalized): Secondary | ICD-10-CM | POA: Diagnosis present

## 2018-05-16 NOTE — Therapy (Signed)
[place], [place], [address] Phone: [phone]   Fax:  [phone]  Physical Therapy Treatment  Patient Details  Name: Beth Thomas MRN: [id number] Date of Birth: 1995/09/03 Referring Provider (PT): Jean Rosenthal, MD   Encounter Date: 05/16/2018  PT End of Session - 05/16/18 1694    Visit Number  5    Number of Visits  10    Date for PT Re-Evaluation  05/31/18    Authorization Type  BCBS    PT Start Time  1713    PT Stop Time  1803    PT Time Calculation (min)  50 min    Activity Tolerance  Patient tolerated treatment well    Behavior During Therapy  [facility] for tasks assessed/performed       Past Medical History:  Diagnosis Date  . Allergy   . Unspecified vitamin D deficiency     Past Surgical History:  Procedure Laterality Date  . TONSILECTOMY/ADENOIDECTOMY WITH MYRINGOTOMY  01/2016    There were no vitals filed for this visit.  Subjective Assessment - 05/16/18 1714    Subjective  No pain pre-tx. Has not been to the gym due to being busy. Most recent pain was over the weekend at work.    Currently in Pain?  No/denies    Pain Score  0-No pain         [facility] PT Assessment - 05/16/18 0001      Strength   Right Hip Flexion  4+/5    Right Hip Extension  4+/5    Right Hip External Rotation   5/5    Right Hip Internal Rotation  5/5    Right Hip ABduction  5/5                   [facility] Adult PT Treatment/Exercise - 05/16/18 0001      Lumbar Exercises: Stretches   Other Lumbar Stretch Exercise  HEP instruction QL stretch variations      Traction   Type of Traction  Lumbar    Min (lbs)  60    Max (lbs)  80    Hold Time  60 sec    Rest Time  20 sec    Time  15      Manual Therapy   Manual Therapy  Soft tissue mobilization;Myofascial release    Manual therapy comments  skilled palpation of trigger points    Soft tissue mobilization  left lumbar paraspinals and QL  region    Myofascial Release  left QL with manual stretch 3x30 sec       Trigger Point Dry Needling - 05/16/18 0001    Muscles Treated Back/Hip  Erector spinae;Lumbar multifidi;Quadratus lumborum   Right L4-5 region for erector spinae and multifidi, right QL   Dry Needling Comments  prone positioning for erector spinae and multifidi with 50 mm 32 gauge needles, QL needled in left sidelying over roll with 30 gauge 75 mm needle           PT Education - 05/16/18 1749    Education Details  HEP update    Person(s) Educated  Patient    Methods  Explanation;Demonstration;Verbal cues;Tactile cues;Handout    Comprehension  Verbalized understanding;Returned demonstration       PT Short Term Goals - 04/19/18 2111      PT SHORT TERM GOAL #1   Title  Independent with HEP    Baseline  no HEP    Time  3    Period  Weeks    Status  New      PT SHORT TERM GOAL #2   Title  Centralize right LE pain to lumbar region for progression to LTGs    Baseline  pain into right lateral hip    Time  3    Period  Weeks    Status  New        PT Long Term Goals - 05/16/18 1755      PT LONG TERM GOAL #1   Title  Improve FOTO score to 22% or less impairment    Baseline  28% limited at eval, not retested today    Time  6    Period  Weeks    Status  On-going      PT LONG TERM GOAL #2   Title  Increase right hip strength to 5/5 to improve ability for lifting for chores and improved mechanics for gym exercises to decrease lumbar strain    Baseline  see flowsheet, improving but not met    Time  6    Period  Weeks    Status  On-going      PT LONG TERM GOAL #3   Title  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     Stability/Clinical Decision Making  Stable/Uncomplicated    Clinical Decision Making  Low    Rehab Potential  Excellent    PT Frequency  --   1-2x/week   PT Duration  6 weeks    PT Treatment/Interventions  ADLs/Self Care Home Management;Cryotherapy;Ultrasound;Traction;Moist Heat;Electrical Stimulation;Neuromuscular re-education;Therapeutic exercise;Therapeutic activities;Patient/family education;Manual techniques;Dry needling;Taping    PT Next Visit Plan  continue traction, continue STM, further dry needling as needed, progress core + hip stabilization as tolerated.    PT Home Exercise Plan  prone press up, right IT band and piriformis stretches, hip bridge, dead bugs, quadruped raises, planks    Consulted and Agree with Plan of Care  Patient       Patient will benefit from skilled therapeutic intervention in order to improve the following deficits and impairments:  Pain, Improper body mechanics, Increased muscle spasms, Impaired flexibility, Decreased strength  Visit Diagnosis: Acute bilateral low back pain with right-sided sciatica  Muscle weakness (generalized)     Problem List Patient Active Problem List   Diagnosis Date Noted  . Anxiety 06/22/2017  . 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 Vitamin D deficiency 05/14/2015  . Other abnormal glucose 05/14/2015  . Allergic rhinitis 01/26/2013   Beaulah Dinning, PT, DPT 05/16/18 6:08 PM  [facility] [address] [place], [place], [address] Phone: [phone]   Fax:  [phone]  Name: Ailyne Pawley MRN: [id number] Date  of Birth: [birth year]

## 2018-05-24 ENCOUNTER — Ambulatory Visit: Payer: BLUE CROSS/BLUE SHIELD | Admitting: Physical Therapy

## 2018-05-24 ENCOUNTER — Encounter: Payer: Self-pay | Admitting: Physical Therapy

## 2018-05-24 DIAGNOSIS — M5441 Lumbago with sciatica, right side: Secondary | ICD-10-CM | POA: Diagnosis not present

## 2018-05-24 DIAGNOSIS — M6281 Muscle weakness (generalized): Secondary | ICD-10-CM

## 2018-05-24 NOTE — Therapy (Signed)
[place], [place], [address] Phone: [phone]   Fax:  [phone]  Physical Therapy Treatment/Discharge Summary  Patient Details  Name: Beth Thomas MRN: [id number] Date of Birth: 12/20/1995 Referring Provider (PT): Jean Rosenthal, MD   Encounter Date: 05/24/2018  PT End of Session - 05/24/18 1806    Visit Number  6    Number of Visits  10    Date for PT Re-Evaluation  05/31/18    Authorization Type  BCBS    PT Start Time  1713    PT Stop Time  1802    PT Time Calculation (min)  49 min    Activity Tolerance  Patient tolerated treatment well    Behavior During Therapy  [facility] for tasks assessed/performed       Past Medical History:  Diagnosis Date  . Allergy   . Unspecified vitamin D deficiency     Past Surgical History:  Procedure Laterality Date  . TONSILECTOMY/ADENOIDECTOMY WITH MYRINGOTOMY  01/2016    There were no vitals filed for this visit.  Subjective Assessment - 05/24/18 1757    Subjective  Pt. reports back doing well, no recent pain and she is in agreement with plans to d/c to HEP today.    Pertinent History  Previous LBP 9/19 resolved at the time, MRI findings small L5-S1 disc protrusion but (-) neural impingement, mild lumbar scoliosis (left convex) noted with imaging    How long can you sit comfortably?  no limitations    How long can you stand comfortably?  no limitations    How long can you walk comfortably?  no limitations    Diagnostic tests  MRI, X-rays    Patient Stated Goals  Learn to minimize pain/if doing activities that exacerbate pain    Currently in Pain?  No/denies    Pain Score  0-No pain         [facility] PT Assessment - 05/24/18 0001      Observation/Other Assessments   Focus on Therapeutic Outcomes (FOTO)   6% limited      AROM   Lumbar Flexion  90    Lumbar Extension  35    Lumbar - Right Side Bend  28    Lumbar - Left Side Bend  32    Lumbar -  Right Rotation  WFL    Lumbar - Left Rotation  [facility] Thumb Region      Strength   Right Hip Flexion  5/5    Right Hip Extension  4+/5    Right Hip External Rotation   4+/5    Right Hip Internal Rotation  5/5    Right Hip ABduction  4+/5    Left Hip Extension  4/5    Left Hip External Rotation  5/5    Left Hip Internal Rotation  5/5    Left Hip ABduction  4+/5    Right Knee Flexion  5/5    Right Knee Extension  5/5    Left Knee Flexion  5/5    Left Knee Extension  5/5                   [facility] Adult PT Treatment/Exercise - 05/24/18 0001      Lumbar Exercises: Standing   Other Standing Lumbar Exercises  KB deadlift 15 lbs. x 10    Other Standing Lumbar Exercises  Hip hinge to deadlift initiation with vertical bar on back for alignment, addiitonal HEP instruction/review extension in standing,  alternative single leg deadlift      Lumbar Exercises: Supine   Other Supine Lumbar Exercises  Hip thrusters x 10      Traction   Type of Traction  Lumbar    Min (lbs)  60    Max (lbs)  80    Hold Time  60 sec    Rest Time  20 sec    Time  15      Manual Therapy   Manual Therapy  Joint mobilization;Soft tissue mobilization    Manual therapy comments  skilled palpation of trigger points    Joint Mobilization  Lumbar PAs L3-5 grade I-III    Soft tissue mobilization  left lower lumbar paraspinals       Trigger Point Dry Needling - 05/24/18 0001    Consent Given?  Yes    Muscles Treated Back/Hip  Erector spinae;Lumbar multifidi   L4-5  on left   Dry Needling Comments  in prone with 60 mm 30 gauge needles           PT Education - 05/24/18 1806    Education Details  POC, HEP updates and exercise progression    Person(s) Educated  Patient    Methods  Explanation;Demonstration;Verbal cues;Handout    Comprehension  Verbalized understanding;Returned demonstration;Verbal cues required       PT Short Term Goals - 04/19/18 2111      PT SHORT TERM GOAL #1   Title  Independent with  HEP    Baseline  no HEP    Time  3    Period  Weeks    Status  New      PT SHORT TERM GOAL #2   Title  Centralize right LE pain to lumbar region for progression to LTGs    Baseline  pain into right lateral hip    Time  3    Period  Weeks    Status  New        PT Long Term Goals - 05/24/18 1807      PT LONG TERM GOAL #1   Title  Improve FOTO score to 22% or less impairment    Baseline  6% limited    Time  6    Period  Weeks    Status  Achieved      PT LONG TERM GOAL #2   Title  Increase right hip strength to 5/5 to improve ability for lifting for chores and improved mechanics for gym exercises to decrease lumbar strain    Baseline  not met    Time  6    Period  Weeks    Status  Not Met      PT LONG TERM GOAL #3   Title  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 has made significant improvements from baseline status with resolved LBP and 22% improvement in FOTO score. Still with some posterior  chain and core weakness but given lack of significant functional limitations at this point expect can continue progress via HEP without need for further formal therapy.    Stability/Clinical Decision Making  Stable/Uncomplicated    Clinical Decision Making  Low    Rehab Potential  Excellent    Clinical Impairments Affecting Rehab Potential  young age, no signficant comorbidities    PT Frequency  --   1-2x/week   PT Duration  6 weeks    PT Treatment/Interventions  ADLs/Self Care Home Management;Cryotherapy;Ultrasound;Traction;Moist Heat;Electrical Stimulation;Neuromuscular re-education;Therapeutic exercise;Therapeutic  activities;Patient/family education;Manual techniques;Dry needling;Taping    PT Next Visit Plan  NA    PT Home Exercise Plan  prone press up, right IT band and piriformis stretches, hip bridge vs. hip thrusters, dead bugs, quadruped raises, planks, KB deadlift    Consulted and Agree with Plan of Care  Patient       Patient will benefit from skilled therapeutic intervention in order to improve the following deficits and impairments:  Impaired flexibility, Decreased strength, Pain, Improper body mechanics  Visit Diagnosis: Acute bilateral low back pain with right-sided sciatica  Muscle weakness (generalized)     Problem List Patient Active Problem List   Diagnosis Date Noted  . Anxiety 06/22/2017  . 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 Vitamin D deficiency 05/14/2015  . Other abnormal glucose 05/14/2015  . Allergic rhinitis 01/26/2013      PHYSICAL THERAPY DISCHARGE SUMMARY  Visits from Start of Care: 6  Current functional level related to goals / functional outcomes: No current pain, therapy goals excepting hip strength goal met   Remaining deficits: Hip/posterior chain and core weakness   Education / Equipment: HEP  Plan: Patient agrees to discharge.  Patient goals were met. Patient is being discharged due to meeting the stated rehab goals.  ?????        Beaulah Dinning, PT, DPT 05/24/18 6:12 PM    [facility] [address] [place], [place], [address] Phone: [phone]   Fax:  [phone]  Name: Beth Thomas MRN: [id number] Date of Birth: 1995-10-01

## 2018-06-09 ENCOUNTER — Other Ambulatory Visit: Payer: Self-pay | Admitting: Physician Assistant

## 2018-06-23 NOTE — Progress Notes (Addendum)
Complete Physical  Assessment and Plan: Other abnormal glucose -     Hemoglobin A1c Discussed disease progression and risks Discussed diet/exercise, weight management and risk modification  Vitamin D deficiency -     VITAMIN D 25 Hydroxy (Vit-D Deficiency, Fractures) Continue supplement  Allergic rhinitis, unspecified seasonality, unspecified trigger Get on nasal spray  Anxiety -     TSH Continue celexa  Screening cholesterol level -     Lipid panel check lipids decrease fatty foods increase activity.   Routine general medical examination at a health care facility 1 year  Medication management -     CBC with Differential/Platelet -     COMPLETE METABOLIC PANEL WITH GFR -     Magnesium  Screening for cervical cancer -     PAP Shows BV- will send in medication  Screening for hematuria or proteinuria -     Urinalysis, Routine w reflex microscopic -     Microalbumin / creatinine urine ratio  Screening, anemia, deficiency, iron -     Iron,Total/Total Iron Binding Cap -     Ferritin -     Vitamin B12  Discussed med's effects and SE's. Screening labs and tests as requested with regular follow-up as recommended. Over 40 minutes of exam, counseling, chart review and critical decision making was performed  HPI  This very nice 23 y.o.female presents for complete physical.  Patient has no major health issues.  Patient reports no complaints at this time.   She is still working at [employer] right now 3-4 days a week.   She does workout, she is trying to do 5 days a week.    She is not dating anyone, declines STD testing, she is on BCP.  Finally, patient has history of Vitamin D Deficiency, not on medication consistently.  Lab Results  Component Value Date   VD25OH 32 06/22/2017  '  BMI is Body mass index is 31.07 kg/m., she is working on diet and exercise. Wt Readings from Last 3 Encounters:  06/27/18 181 lb (82.1 kg)  06/22/17 158 lb 6.4 oz (71.8 kg)   05/03/17 165 lb (74.8 kg)   The cholesterol last visit was:   Lab Results  Component Value Date   CHOL 227 (H) 06/22/2017   HDL 50 (L) 06/22/2017   LDLCALC 149 (H) 06/22/2017   TRIG 153 (H) 06/22/2017   CHOLHDL 4.5 06/22/2017   Last Z6XA1C in the office was:  Lab Results  Component Value Date   HGBA1C 5.0 06/04/2016    Current Medications:  Current Outpatient Medications on File Prior to Visit  Medication Sig Dispense Refill  . Cetirizine HCl (ZYRTEC ALLERGY) 10 MG CAPS Take 10 mg by mouth daily.    . citalopram (CELEXA) 20 MG tablet TAKE 1 TABLET(20 MG) BY MOUTH DAILY 30 tablet 1  . cyclobenzaprine (FLEXERIL) 10 MG tablet Take 1 tablet (10 mg total) by mouth 3 (three) times daily as needed for muscle spasms. 30 tablet 0  . diclofenac (VOLTAREN) 75 MG EC tablet Take 1 tablet (75 mg total) by mouth 2 (two) times daily between meals as needed. 60 tablet 0  . drospirenone-ethinyl estradiol (SYEDA) 3-0.03 MG tablet Take 1 tablet by mouth daily. 84 tablet 2  . polyethylene glycol powder (GLYCOLAX/MIRALAX) powder DISSOLVE 17 GRAMS IN LIQUID AND DRINK AS DIRECTED BY PRESCRIBER 765 g 0   No current facility-administered medications on file prior to visit.    Health Maintenance:   Immunization History  Administered Date(s) Administered  .  HPV 9-valent 10/10/2014, 12/12/2014, 05/14/2015  . Influenza Inj Mdck Quad With Preservative 11/17/2016  . Influenza-Unspecified 11/17/2016  . Tdap 11/15/2006, 06/04/2016   TD/TDAP: 2018 Influenza: 2018 did not get this past Pneumovax: N/a Prevnar 13: N/a HPV vaccine  completed  LMP: Patient's last menstrual period was 06/10/2018. Sexually Active: on BCP, not sexually active, declines STD testing.  Pap: 2019 normal, will check yearly until 28 MGM:  [place] left breast    Medical History:  Past Medical History:  Diagnosis Date  . Allergy   . Unspecified vitamin D deficiency    Allergies No Known Allergies  SURGICAL HISTORY She  has a past  surgical history that includes Tonsilectomy/adenoidectomy with myringotomy (01/2016). FAMILY HISTORY Her family history includes Diabetes in her mother; Hyperlipidemia in her father and mother; Hypertension in her father and mother. SOCIAL HISTORY She  reports that she is a non-smoker but has been exposed to tobacco smoke. She has never used smokeless tobacco. She reports that she does not drink alcohol or use drugs. 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   HENT: Negative.   Eyes: Negative.   Respiratory: Negative.   Cardiovascular: Negative.   Gastrointestinal: Negative.   Genitourinary: Negative.   Musculoskeletal: Negative.   Skin: Positive for rash. Negative for itching.  Neurological: Negative.   Endo/Heme/Allergies: Negative.   Psychiatric/Behavioral: Negative.     Physical Exam: Estimated body mass index is 31.07 kg/m as calculated from the following:   Height as of this encounter: 5\' 4"  (1.626 m).   Weight as of this encounter: 181 lb (82.1 kg). BP 126/74   Pulse 76   Temp 97.6 F (36.4 C)   Ht 5\' 4"  (1.626 m)   Wt 181 lb (82.1 kg)   LMP 06/10/2018   SpO2 97%   BMI 31.07 kg/m  General Appearance: Well nourished, in no apparent distress.  Eyes: PERRLA, EOMs, conjunctiva no swelling or erythema, normal fundi and vessels. Right outer eye with erythematous scaly area with bumps.   Sinuses: No Frontal/maxillary tenderness  ENT/Mouth: Ext aud canals clear, normal light reflex with TMs without erythema, bulging. Good dentition. No erythema, swelling, or exudate on post pharynx. Tonsils not swollen or erythematous. Hearing normal.  Neck: Supple, thyroid normal. No bruits  Respiratory: Respiratory effort normal, BS equal bilaterally without rales, rhonchi, wheezing or stridor.  Cardio: RRR without murmurs, rubs or  gallops. Brisk peripheral pulses without edema.  Chest: symmetric, with normal excursions and percussion.  Breasts: Symmetric, without lumps, nipple discharge, retractions.  Abdomen: Soft, nontender, no guarding, rebound, hernias, masses, or organomegaly.  Lymphatics: Non tender without lymphadenopathy.  Genitourinary: normal external genitalia, vulva, vagina, cervix, uterus and adnexa, PAP: Pap smear done today. Musculoskeletal: Full ROM all peripheral extremities,5/5 strength, and normal gait.  Skin: Warm, dry without rashes, lesions, ecchymosis. Neuro: Cranial nerves intact, reflexes equal bilaterally. Normal muscle tone, no cerebellar symptoms. Sensation intact.  Psych: Awake and oriented X 3, normal affect, Insight and Judgment appropriate.   EKG: defer  Quentin Mulling 10:39 AM [facility]

## 2018-06-27 ENCOUNTER — Ambulatory Visit: Payer: BLUE CROSS/BLUE SHIELD | Admitting: Physician Assistant

## 2018-06-27 ENCOUNTER — Other Ambulatory Visit: Payer: Self-pay

## 2018-06-27 ENCOUNTER — Encounter: Payer: Self-pay | Admitting: Physician Assistant

## 2018-06-27 VITALS — BP 126/74 | HR 76 | Temp 97.6°F | Ht 64.0 in | Wt 181.0 lb

## 2018-06-27 DIAGNOSIS — Z1389 Encounter for screening for other disorder: Secondary | ICD-10-CM | POA: Diagnosis not present

## 2018-06-27 DIAGNOSIS — Z124 Encounter for screening for malignant neoplasm of cervix: Secondary | ICD-10-CM | POA: Diagnosis not present

## 2018-06-27 DIAGNOSIS — Z0000 Encounter for general adult medical examination without abnormal findings: Secondary | ICD-10-CM

## 2018-06-27 DIAGNOSIS — Z131 Encounter for screening for diabetes mellitus: Secondary | ICD-10-CM | POA: Diagnosis not present

## 2018-06-27 DIAGNOSIS — J309 Allergic rhinitis, unspecified: Secondary | ICD-10-CM

## 2018-06-27 DIAGNOSIS — E559 Vitamin D deficiency, unspecified: Secondary | ICD-10-CM | POA: Diagnosis not present

## 2018-06-27 DIAGNOSIS — Z13220 Encounter for screening for lipoid disorders: Secondary | ICD-10-CM

## 2018-06-27 DIAGNOSIS — F419 Anxiety disorder, unspecified: Secondary | ICD-10-CM

## 2018-06-27 DIAGNOSIS — Z1329 Encounter for screening for other suspected endocrine disorder: Secondary | ICD-10-CM | POA: Diagnosis not present

## 2018-06-27 DIAGNOSIS — Z130 Encounter for screening for diseases of the blood and blood-forming organs and certain disorders involving the immune mechanism: Secondary | ICD-10-CM | POA: Diagnosis not present

## 2018-06-27 DIAGNOSIS — R7309 Other abnormal glucose: Secondary | ICD-10-CM

## 2018-06-27 DIAGNOSIS — Z79899 Other long term (current) drug therapy: Secondary | ICD-10-CM | POA: Diagnosis not present

## 2018-06-27 DIAGNOSIS — B977 Papillomavirus as the cause of diseases classified elsewhere: Secondary | ICD-10-CM

## 2018-06-27 NOTE — Patient Instructions (Addendum)
VITAMIN D IS IMPORTANT  Vitamin D goal is between 60-80  Your last vitamin D was 32.   Please make sure that you are taking your Vitamin D as directed.   It is very important as a natural anti-inflammatory   helping hair, skin, and nails, as well as reducing stroke and heart attack risk.   It helps your bones and helps with mood.  We want you on at least 5000 IU daily  It also decreases numerous cancer risks so please take it as directed.   Low Vit D is associated with a 200-300% higher risk for CANCER   and 200-300% higher risk for HEART   ATTACK  &  STROKE.    .....................................Beth Thomas.  It is also associated with higher death rate at younger ages,   autoimmune diseases like Rheumatoid arthritis, Lupus, Multiple Sclerosis.     Also many other serious conditions, like depression, Alzheimer's  Dementia, infertility, muscle aches, fatigue, fibromyalgia - just to name a few.  +++++++++++++++++++  Can get liquid vitamin D from [place]  OR [place] at  [facility] [address], [place], [place] [id number] Or you can try [employer]   Drink 1/2 your body weight in fluid ounces of water daily; drink a tall glass of water 30 min before meals  Don't eat until you're stuffed- listen to your stomach and eat until you are 80% full   Try eating off of a salad plate; wait 10 min after finishing before going back for seconds  Start by eating the vegetables on your plate; aim for 60%50% of your meals to be fruits or vegetables  Then eat your protein - lean meats (grass fed if possible), fish, beans, nuts in moderation  Eat your carbs/starch last ONLY if you still are hungry. If you can, stop before finishing it all  Avoid sugar and flour - the closer it looks to it's original form in nature, typically the better it is for you  Splurge in moderation - "assign" days when you get to splurge and have the "bad stuff" - I like to follow a 80% - 20% plan- "good"  choices 80 % of the time, "bad" choices in moderation 20% of the time  Simple equation is: Calories out > calories in = weight loss - even if you eat the bad stuff, if you limit portions, you will still lose weight  Can do a steroid nasal spary 1-2 sparys at night each nostril.  Remember to spray each nostril twice towards the outer part of your eye.   Do not sniff but instead pinch your nose and tilt your head back to help the medicine get into your sinuses.   The best time to do this is at bedtime.  Stop if you get blurred vision or nose bleeds.   THIS WILL TAKE 7 DAYS TO WORK AND IS BETTER IF YOU START BEFORE SYMPTOMS SO IF YOU HAVE A SEASON OR TIME OF THE YEAR YOU ALWAYS GET A COLD, START BEFORE THAT!  Try the melatonin 5mg -20mg  dissolvable or gummy 30 mins before bed   11 Tips to Follow:  10. No caffeine after 3pm: Avoid beverages with caffeine (soda, tea, energy drinks, etc.) especially after 3pm. 2. Don't go to bed hungry: Have your evening meal at least 3 hrs. before going to sleep. It's fine to have a small bedtime snack such as a glass of milk and a few crackers but don't have a big meal. 3. Have a  nightly routine before bed: Plan on "winding down" before you go to sleep. Begin relaxing about 1 hour before you go to bed. Try doing a quiet activity such as listening to calming music, reading a book or meditating. 4. Turn off the TV and ALL electronics including video games, tablets, laptops, etc. 1 hour before sleep, and keep them out of the bedroom. 5. Turn off your cell phone and all notifications (new email and text alerts) or even better, leave your phone outside your room while you sleep. Studies have shown that a part of your brain continues to respond to certain lights and sounds even while you're still asleep. 6. Make your bedroom quiet, dark and cool. If you can't control the noise, try wearing earplugs or using a fan to block out other sounds. 7. Practice relaxation  techniques. Try reading a book or meditating or drain your brain by writing a list of what you need to do the next day. 8. Don't nap unless you feel sick: you'll have a better night's sleep. 9. Don't smoke, or quit if you do. Nicotine, alcohol, and marijuana can all keep you awake. Talk to your health care provider if you need help with substance use. 10. Most importantly, wake up at the same time every day (or within 1 hour of your usual wake up time) EVEN on the weekends. A regular wake up time promotes sleep hygiene and prevents sleep problems. 11. Reduce exposure to bright light in the last three hours of the day before going to sleep. Maintaining good sleep hygiene and having good sleep habits lower your risk of developing sleep problems. Getting better sleep can also improve your concentration and alertness. Try the simple steps in this guide. If you still have trouble getting enough rest, make an appointment with your health care provider.

## 2018-06-28 LAB — CBC WITH DIFFERENTIAL/PLATELET
Absolute Monocytes: 533 cells/uL (ref 200–950)
Basophils Absolute: 29 cells/uL (ref 0–200)
Basophils Relative: 0.4 %
Eosinophils Absolute: 194 cells/uL (ref 15–500)
Eosinophils Relative: 2.7 %
HCT: 37.6 % (ref 35.0–45.0)
Hemoglobin: 12.7 g/dL (ref 11.7–15.5)
Lymphs Abs: 2275 cells/uL (ref 850–3900)
MCH: 28.9 pg (ref 27.0–33.0)
MCHC: 33.8 g/dL (ref 32.0–36.0)
MCV: 85.6 fL (ref 80.0–100.0)
MPV: 12.4 fL (ref 7.5–12.5)
Monocytes Relative: 7.4 %
Neutro Abs: 4169 cells/uL (ref 1500–7800)
Neutrophils Relative %: 57.9 %
Platelets: 244 10*3/uL (ref 140–400)
RBC: 4.39 10*6/uL (ref 3.80–5.10)
RDW: 13 % (ref 11.0–15.0)
Total Lymphocyte: 31.6 %
WBC: 7.2 10*3/uL (ref 3.8–10.8)

## 2018-06-28 LAB — COMPLETE METABOLIC PANEL WITH GFR
AG Ratio: 1.3 (calc) (ref 1.0–2.5)
ALT: 12 U/L (ref 6–29)
AST: 16 U/L (ref 10–30)
Albumin: 4.3 g/dL (ref 3.6–5.1)
Alkaline phosphatase (APISO): 37 U/L (ref 31–125)
BUN: 11 mg/dL (ref 7–25)
CO2: 25 mmol/L (ref 20–32)
Calcium: 9.4 mg/dL (ref 8.6–10.2)
Chloride: 103 mmol/L (ref 98–110)
Creat: 0.66 mg/dL (ref 0.50–1.10)
GFR, Est African American: 143 mL/min/{1.73_m2} (ref >=60)
GFR, Est Non African American: 124 mL/min/{1.73_m2} (ref >=60)
Globulin: 3.2 g/dL (calc) (ref 1.9–3.7)
Glucose, Bld: 85 mg/dL (ref 65–99)
Potassium: 4.1 mmol/L (ref 3.5–5.3)
Sodium: 135 mmol/L (ref 135–146)
Total Bilirubin: 0.3 mg/dL (ref 0.2–1.2)
Total Protein: 7.5 g/dL (ref 6.1–8.1)

## 2018-06-28 LAB — MICROALBUMIN / CREATININE URINE RATIO
Creatinine, Urine: 36 mg/dL (ref 20–275)
Microalb Creat Ratio: 6 mcg/mg creat (ref <30)
Microalb, Ur: 0.2 mg/dL

## 2018-06-28 LAB — MAGNESIUM: Magnesium: 1.8 mg/dL (ref 1.5–2.5)

## 2018-06-28 LAB — URINALYSIS, ROUTINE W REFLEX MICROSCOPIC
Bilirubin Urine: NEGATIVE
Glucose, UA: NEGATIVE
Hgb urine dipstick: NEGATIVE
Ketones, ur: NEGATIVE
Leukocytes,Ua: NEGATIVE
Nitrite: NEGATIVE
Protein, ur: NEGATIVE
Specific Gravity, Urine: 1.006 (ref 1.001–1.03)
pH: 7 (ref 5.0–8.0)

## 2018-06-28 LAB — IRON, TOTAL/TOTAL IRON BINDING CAP
Iron: 48 ug/dL (ref 40–190)
TIBC: 464 mcg/dL (calc) — ABNORMAL HIGH (ref 250–450)

## 2018-06-28 LAB — TSH: TSH: 1.57 mIU/L

## 2018-06-28 LAB — PAP IG W/ RFLX HPV ASCU

## 2018-06-28 LAB — HEMOGLOBIN A1C
Hgb A1c MFr Bld: 5.3 % of total Hgb (ref <5.7)
Mean Plasma Glucose: 105 (calc)
eAG (mmol/L): 5.8 (calc)

## 2018-06-28 LAB — LIPID PANEL
Cholesterol: 223 mg/dL — ABNORMAL HIGH (ref <200)
HDL: 52 mg/dL (ref >=50)
LDL Cholesterol (Calc): 141 mg/dL (calc) — ABNORMAL HIGH
Non-HDL Cholesterol (Calc): 171 mg/dL (calc) — ABNORMAL HIGH (ref <130)
Total CHOL/HDL Ratio: 4.3 (calc) (ref <5.0)
Triglycerides: 167 mg/dL — ABNORMAL HIGH (ref <150)

## 2018-06-28 LAB — FERRITIN: Ferritin: 13 ng/mL — ABNORMAL LOW (ref 16–154)

## 2018-06-28 LAB — IRON,?TOTAL/TOTAL IRON BINDING CAP: %SAT: 10 % (calc) — ABNORMAL LOW (ref 16–45)

## 2018-06-28 LAB — C. TRACHOMATIS/N. GONORRHOEAE RNA
C. trachomatis RNA, TMA: NOT DETECTED
N. gonorrhoeae RNA, TMA: NOT DETECTED

## 2018-06-28 LAB — VITAMIN B12: Vitamin B-12: 261 pg/mL (ref 200–1100)

## 2018-06-28 LAB — VITAMIN D 25 HYDROXY (VIT D DEFICIENCY, FRACTURES): Vit D, 25-Hydroxy: 38 ng/mL (ref 30–100)

## 2018-06-28 LAB — PAP, TP IMAGING, WNL RFLX HPV

## 2018-06-28 MED ORDER — METRONIDAZOLE 500 MG PO TABS: 500.0000 mg | ORAL_TABLET | Freq: Two times a day (BID) | ORAL | 0 refills | Status: AC

## 2018-06-28 NOTE — Addendum Note (Signed)
Addended by: Quentin Mulling R on: 06/28/2018 11:18 AM   Modules accepted: Orders

## 2018-08-15 ENCOUNTER — Other Ambulatory Visit: Payer: Self-pay | Admitting: Internal Medicine

## 2018-08-15 DIAGNOSIS — F419 Anxiety disorder, unspecified: Secondary | ICD-10-CM

## 2018-08-15 MED ORDER — CITALOPRAM HYDROBROMIDE 20 MG PO TABS: ORAL_TABLET | ORAL | 1 refills | Status: DC

## 2018-08-27 ENCOUNTER — Other Ambulatory Visit: Payer: Self-pay | Admitting: Physician Assistant

## 2018-08-27 DIAGNOSIS — Z30011 Encounter for initial prescription of contraceptive pills: Secondary | ICD-10-CM

## 2018-09-12 ENCOUNTER — Other Ambulatory Visit: Payer: Self-pay

## 2018-09-12 ENCOUNTER — Ambulatory Visit (INDEPENDENT_AMBULATORY_CARE_PROVIDER_SITE_OTHER): Payer: BC Managed Care – PPO

## 2018-09-12 VITALS — BP 122/86 | HR 86 | Temp 97.5°F | Ht 61.0 in | Wt 173.0 lb

## 2018-09-12 DIAGNOSIS — Z111 Encounter for screening for respiratory tuberculosis: Secondary | ICD-10-CM

## 2018-09-12 NOTE — Progress Notes (Signed)
Patient reports for TB screening. Left forearm Vitals entered into EPIC at check in.  Patient will return on wed to have PPD read & pick up school forms.

## 2018-12-22 NOTE — Progress Notes (Deleted)
FOLLOW UP  Assessment and Plan:   Continue diet and meds as discussed. Further disposition pending results of labs. Over 30 minutes of exam, counseling, chart review, and critical decision making was performed  Future Appointments  Date Time Provider [place]  12/27/2018 10:45 AM Vicie Mutters, PA-C [facility] None  06/29/2019 10:00 AM Vicie Mutters, PA-C [facility] None     HPI 23 y.o. female  presents for 3 month follow up on hypertension, cholesterol, prediabetes, and vitamin D deficiency.   Her blood pressure {HAS HAS NOT:18834} been controlled at home, today their BP is    BMI is There is no height or weight on file to calculate BMI., she is working on diet and exercise. Wt Readings from Last 3 Encounters:  09/12/18 173 lb (78.5 kg)  06/27/18 181 lb (82.1 kg)  06/22/17 158 lb 6.4 oz (71.8 kg)     She {DOES_DOES QVZ:56387} workout. She denies chest pain, shortness of breath, dizziness.   She  {ACTION; IS/IS FIE:33295188}  on cholesterol medication and denies myalgias. Her cholesterol {ACTION; IS/IS NOT:21021397} at goal. The cholesterol last visit was:   Lab Results  Component Value Date   CHOL 223 (H) 06/27/2018   HDL 52 06/27/2018   LDLCALC 141 (H) 06/27/2018   TRIG 167 (H) 06/27/2018   CHOLHDL 4.3 06/27/2018    She had iron and B12 anemia last visit.  Lab Results  Component Value Date   VITAMINB12 261 06/27/2018   Lab Results  Component Value Date   IRON 48 06/27/2018   TIBC 464 (H) 06/27/2018   FERRITIN 13 (L) 06/27/2018    Patient {ACTION; IS/IS CZY:[id number]} on Vitamin D supplement.   Lab Results  Component Value Date   VD25OH 38 06/27/2018       Current Medications:   Current Outpatient Medications (Endocrine & Metabolic):  .  drospirenone-ethinyl estradiol (SYEDA) 3-0.03 MG tablet, Take 1 tablet Daily   Current Outpatient Medications (Respiratory):  Marland Kitchen  Cetirizine HCl (ZYRTEC ALLERGY) 10 MG CAPS, Take 10 mg by mouth  daily.  Current Outpatient Medications (Analgesics):  .  diclofenac (VOLTAREN) 75 MG EC tablet, Take 1 tablet (75 mg total) by mouth 2 (two) times daily between meals as needed.   Current Outpatient Medications (Other):  .  citalopram (CELEXA) 20 MG tablet, Take 1 tablet Daily for Mood / Anxiety .  cyclobenzaprine (FLEXERIL) 10 MG tablet, Take 1 tablet (10 mg total) by mouth 3 (three) times daily as needed for muscle spasms. .  polyethylene glycol powder (GLYCOLAX/MIRALAX) powder, DISSOLVE 17 GRAMS IN LIQUID AND DRINK AS DIRECTED BY PRESCRIBER  Medical History:  Past Medical History:  Diagnosis Date  . Allergy   . Unspecified vitamin D deficiency    Allergies: No Known Allergies   Review of Systems:  ROS  Family history- Review and unchanged Social history- Review and unchanged Physical Exam: There were no vitals taken for this visit. Wt Readings from Last 3 Encounters:  09/12/18 173 lb (78.5 kg)  06/27/18 181 lb (82.1 kg)  06/22/17 158 lb 6.4 oz (71.8 kg)   General Appearance: Well nourished, in no apparent distress. Eyes: PERRLA, EOMs, conjunctiva no swelling or erythema Sinuses: No Frontal/maxillary tenderness ENT/Mouth: Ext aud canals clear, TMs without erythema, bulging. No erythema, swelling, or exudate on post pharynx.  Tonsils not swollen or erythematous. Hearing normal.  Neck: Supple, thyroid normal.  Respiratory: Respiratory effort normal, BS equal bilaterally without rales, rhonchi, wheezing or stridor.  Cardio: RRR with no MRGs. Brisk peripheral  pulses without edema.  Abdomen: Soft, + BS,  Non tender, no guarding, rebound, hernias, masses. Lymphatics: Non tender without lymphadenopathy.  Musculoskeletal: Full ROM, 5/5 strength, {PSY - GAIT AND STATION:22860} gait Skin: Warm, dry without rashes, lesions, ecchymosis.  Neuro: Cranial nerves intact. Normal muscle tone, no cerebellar symptoms. Psych: Awake and oriented X 3, normal affect, Insight and Judgment  appropriate.    Quentin Mulling, PA-C 1:15 PM [facility]

## 2018-12-26 NOTE — Progress Notes (Signed)
FOLLOW UP  Assessment and Plan:    Vitamin D deficiency -     VITAMIN D 25 Hydroxy (Vit-D Deficiency, Fractures)  Other abnormal glucose Discussed disease progression and risks Discussed diet/exercise, weight management and risk modification  Anxiety -     TSH  Allergic rhinitis, unspecified seasonality, unspecified trigger Continue allergy med and flonase  Medication management -     CBC with Differential/Platelet -     COMPLETE METABOLIC PANEL WITH GFR  Mixed hyperlipidemia -     Lipid panel check lipids decrease fatty foods increase activity.   Iron deficiency -     Iron,Total/Total Iron Binding Cap  B12 deficiency -     Vitamin B12 - if not better may need to do injections    Continue diet and meds as discussed. Further disposition pending results of labs. Over 30 minutes of exam, counseling, chart review, and critical decision making was performed  Future Appointments  Date Time Provider Department [facility]  06/29/2019 10:00 AM Quentin Mulling, PA-C [facility] None     HPI 23 y.o. female  presents for 3 month follow up on hypertension, cholesterol, prediabetes, and vitamin D deficiency.   Her blood pressure has been controlled at home, today their BP is BP: 122/76.  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 Wt Readings from Last 3 Encounters:  12/28/18 188 lb 6.4 oz (85.5 kg)  09/12/18 173 lb (78.5 kg)  06/27/18 181 lb (82.1 kg)    She does workout, running and weights 2-3 x a week. . She denies chest pain, shortness of breath, dizziness.   She  is not  on cholesterol medication and denies myalgias. Her cholesterol is not at goal. The cholesterol last visit was:   Lab Results  Component Value Date   CHOL 223 (H) 06/27/2018   HDL 52 06/27/2018   LDLCALC 141 (H) 06/27/2018   TRIG 167 (H) 06/27/2018   CHOLHDL 4.3 06/27/2018   She had iron and B12 anemia last visit, forgets occ.  Lab Results  Component Value Date   VITAMINB12 261 06/27/2018   Lab Results  Component Value Date   IRON 48 06/27/2018   TIBC 464 (H) 06/27/2018   FERRITIN 13 (L) 06/27/2018    Patient is on Vitamin D supplement, not great at taking it.  Lab Results  Component Value Date   VD25OH 38 06/27/2018       Current Medications:   Current Outpatient Medications (Endocrine & Metabolic):  .  drospirenone-ethinyl estradiol (SYEDA) 3-0.03 MG tablet, Take 1 tablet Daily   Current Outpatient Medications (Respiratory):  Marland Kitchen  Cetirizine HCl (ZYRTEC ALLERGY) 10 MG CAPS, Take 10 mg by mouth daily.  Current Outpatient Medications (Analgesics):  .  diclofenac (VOLTAREN) 75 MG EC tablet, Take 1 tablet (75 mg total) by mouth 2 (two) times daily between meals as needed.   Current Outpatient Medications (Other):  .  citalopram (CELEXA) 20 MG tablet, Take 1 tablet Daily for Mood / Anxiety .  polyethylene glycol powder (GLYCOLAX/MIRALAX) powder, DISSOLVE 17 GRAMS IN LIQUID AND DRINK AS DIRECTED BY PRESCRIBER .  cyclobenzaprine (FLEXERIL) 10 MG tablet, Take 1 tablet (10 mg total) by mouth 3 (three) times daily as needed for muscle spasms.  Medical History:  Past Medical History:  Diagnosis Date  . Allergy   . Unspecified  vitamin D deficiency    Allergies: No Known Allergies   Review of Systems:  ROS  Family history- Review and unchanged Social history- Review and unchanged Physical Exam: BP 122/76   Pulse 86   Temp 97.7 F (36.5 C)   Ht 5\' 1"  (1.549 m)   Wt 188 lb 6.4 oz (85.5 kg)   SpO2 98%   BMI 35.60 kg/m  Wt Readings from Last 3 Encounters:  12/28/18 188 lb 6.4 oz (85.5 kg)  09/12/18 173 lb (78.5 kg)  06/27/18 181 lb (82.1 kg)   General Appearance: Well nourished, in no apparent distress. Eyes: PERRLA, EOMs, conjunctiva no swelling or erythema Sinuses: No Frontal/maxillary tenderness ENT/Mouth:  Ext aud canals clear, TMs without erythema, bulging. No erythema, swelling, or exudate on post pharynx.  Tonsils not swollen or erythematous. Hearing normal.  Neck: Supple, thyroid normal.  Respiratory: Respiratory effort normal, BS equal bilaterally without rales, rhonchi, wheezing or stridor.  Cardio: RRR with no MRGs. Brisk peripheral pulses without edema.  Abdomen: Soft, + BS,  Non tender, no guarding, rebound, hernias, masses. Lymphatics: Non tender without lymphadenopathy.  Musculoskeletal: Full ROM, 5/5 strength, Normal gait Skin: Warm, dry without rashes, lesions, ecchymosis.  Neuro: Cranial nerves intact. Normal muscle tone, no cerebellar symptoms. Psych: Awake and oriented X 3, normal affect, Insight and Judgment appropriate.    Vicie Mutters, PA-C 2:51 PM [facility]

## 2018-12-27 ENCOUNTER — Ambulatory Visit: Payer: BLUE CROSS/BLUE SHIELD | Admitting: Physician Assistant

## 2018-12-28 ENCOUNTER — Other Ambulatory Visit: Payer: Self-pay

## 2018-12-28 ENCOUNTER — Encounter: Payer: Self-pay | Admitting: Physician Assistant

## 2018-12-28 ENCOUNTER — Ambulatory Visit: Payer: BC Managed Care – PPO | Admitting: Physician Assistant

## 2018-12-28 VITALS — BP 122/76 | HR 86 | Temp 97.7°F | Ht 61.0 in | Wt 188.4 lb

## 2018-12-28 DIAGNOSIS — Z79899 Other long term (current) drug therapy: Secondary | ICD-10-CM

## 2018-12-28 DIAGNOSIS — E611 Iron deficiency: Secondary | ICD-10-CM

## 2018-12-28 DIAGNOSIS — F419 Anxiety disorder, unspecified: Secondary | ICD-10-CM

## 2018-12-28 DIAGNOSIS — E538 Deficiency of other specified B group vitamins: Secondary | ICD-10-CM | POA: Diagnosis not present

## 2018-12-28 DIAGNOSIS — E782 Mixed hyperlipidemia: Secondary | ICD-10-CM

## 2018-12-28 DIAGNOSIS — E559 Vitamin D deficiency, unspecified: Secondary | ICD-10-CM

## 2018-12-28 DIAGNOSIS — R7309 Other abnormal glucose: Secondary | ICD-10-CM

## 2018-12-28 DIAGNOSIS — J309 Allergic rhinitis, unspecified: Secondary | ICD-10-CM | POA: Diagnosis not present

## 2018-12-28 NOTE — Patient Instructions (Addendum)
General eating tips  What to Avoid . Avoid added sugars o Often added sugar can be found in processed foods such as many condiments, dry cereals, cakes, cookies, chips, crisps, crackers, candies, sweetened drinks, etc.  o Read labels and AVOID/DECREASE use of foods with the following in their ingredient list: Sugar, fructose, high fructose corn syrup, sucrose, glucose, maltose, dextrose, molasses, cane sugar, brown sugar, any type of syrup, agave nectar, etc.   . Avoid snacking in between meals- drink water or if you feel you need a snack, pick a high water content snack such as cucumbers, watermelon, or any veggie.  Marland Kitchen. Avoid foods made with flour o If you are going to eat food made with flour, choose those made with whole-grains; and, minimize your consumption as much as is tolerable . Avoid processed foods o These foods are generally stocked in the middle of the grocery store.  o Focus on shopping on the perimeter of the grocery.  What to Include . Vegetables o GREEN LEAFY VEGETABLES: Kale, spinach, mustard greens, collard greens, cabbage, broccoli, etc. o OTHER: Asparagus, cauliflower, eggplant, carrots, peas, Brussel sprouts, tomatoes, bell peppers, zucchini, beets, cucumbers, etc. . Grains, seeds, and legumes o Beans: kidney beans, black eyed peas, garbanzo beans, black beans, pinto beans, etc. o Whole, unrefined grains: brown rice, barley, bulgur, oatmeal, etc. . Healthy fats  o Avoid highly processed fats such as vegetable oil o Examples of healthy fats: avocado, olives, virgin olive oil, dark chocolate (?72% Cocoa), nuts (peanuts, almonds, walnuts, cashews, pecans, etc.) o Please still do small amount of these healthy fats, they are dense in calories.  . Low - Moderate Intake of Animal Sources of Protein o Meat sources: chicken, [place], salmon, tuna. Limit to 4 ounces of meat at one time or the size of your palm. o Consider limiting dairy sources, but when choosing dairy focus on:  PLAIN [place] yogurt, cottage cheese, high-protein milk . Fruit o Choose berries     Vitamin B12 Deficiency Vitamin B12 deficiency occurs when the body does not have enough vitamin B12, which is an important vitamin. The body needs this vitamin:  To make red blood cells.  To make DNA. This is the genetic material inside cells.  To help the nerves work properly so they can carry messages from the brain to the body. Vitamin B12 deficiency can cause various health problems, such as a low red blood cell count (anemia) or nerve damage. What are the causes? This condition may be caused by:  Not eating enough foods that contain vitamin B12.  Not having enough stomach acid and digestive fluids to properly absorb vitamin B12 from the food that you eat.  Certain digestive system diseases that make it hard to absorb vitamin B12. These diseases include Crohn's disease, chronic pancreatitis, and cystic fibrosis.  A condition in which the body does not make enough of a protein (intrinsic factor), resulting in too few red blood cells (pernicious anemia).  Having a surgery in which part of the stomach or small intestine is removed.  Taking certain medicines that make it hard for the body to absorb vitamin B12. These medicines include: ? Heartburn medicines (antacids and proton pump inhibitors). ? Certain antibiotic medicines. ? Some medicines that are used to treat diabetes, tuberculosis, gout, or high cholesterol. What increases the risk? The following factors may make you more likely to develop a B12 deficiency:  Being older than age 25.  Eating a vegetarian or vegan diet, especially while you are  pregnant.  Eating a poor diet while you are pregnant.  Taking certain medicines.  Having alcoholism. What are the signs or symptoms? In some cases, there are no symptoms of this condition. If the condition leads to anemia or nerve damage, various symptoms can occur, such as:  Weakness.   Fatigue.  Numbness or tingling in your hands and feet.  Redness and burning of the tongue.  Confusion or memory problems.  Depression.  Sensory problems, such as color blindness, ringing in the ears, or loss of taste.  Diarrhea or constipation.  Trouble walking. If anemia is severe, symptoms can include:  Shortness of breath.  Dizziness.  Rapid heart rate (tachycardia). How is this diagnosed? This condition may be diagnosed with a blood test to measure the level of vitamin B12 in your blood. You may also have other tests, including:  A group of tests that measure certain characteristics of blood cells (complete blood count, CBC).  A blood test to measure intrinsic factor.  A procedure where a thin tube with a camera on the end is used to look into your stomach or intestines (endoscopy). Other tests may be needed to discover the cause of B12 deficiency. How is this treated? Treatment for this condition depends on the cause. This condition may be treated by:  Changing your eating and drinking habits, such as: ? Eating more foods that contain vitamin B12. ? Drinking less alcohol or no alcohol.  Getting vitamin B12 injections.  Taking vitamin B12 supplements. Your health care provider will tell you which dosage is best for you. Follow these instructions at home: Eating and drinking   Eat lots of healthy foods that contain vitamin B12, including: ? Meats and poultry. This includes beef, pork, chicken, [place], and organ meats, such as liver. ? Seafood. This includes clams, rainbow trout, salmon, tuna, and haddock. ? Eggs. ? Cereal and dairy products that are fortified. This means that vitamin B12 has been added to the food. Check the label on the package to see if the food is fortified. The items listed above may not be a complete list of recommended foods and beverages. Contact a dietitian for more information. General instructions  Get any injections that are  prescribed by your health care provider.  Take supplements only as told by your health care provider. Follow the directions carefully.  Do not drink alcohol if your health care provider tells you not to. In some cases, you may only be asked to limit alcohol use.  Keep all follow-up visits as told by your health care provider. This is important. Contact a health care provider if:  Your symptoms come back. Get help right away if you:  Develop shortness of breath.  Have a rapid heart rate.  Have chest pain.  Become dizzy or lose consciousness. Summary  Vitamin B12 deficiency occurs when the body does not have enough vitamin B12.  The main causes of vitamin B12 deficiency include dietary deficiency, digestive diseases, pernicious anemia, and having a surgery in which part of the stomach or small intestine is removed.  In some cases, there are no symptoms of this condition. If the condition leads to anemia or nerve damage, various symptoms can occur, such as weakness, shortness of breath, and numbness.  Treatment may include getting vitamin B12 injections or taking vitamin B12 supplements. Eat lots of healthy foods that contain vitamin B12. This information is not intended to replace advice given to you by your health care provider. Make sure you discuss  any questions you have with your health care provider. Document Released: 05/25/2011 Document Revised: 11/09/2017 Document Reviewed: 11/09/2017 Elsevier Patient Education  2020 [employer].

## 2018-12-29 LAB — COMPLETE METABOLIC PANEL WITH GFR
AG Ratio: 1.4 (calc) (ref 1.0–2.5)
ALT: 22 U/L (ref 6–29)
AST: 23 U/L (ref 10–30)
Albumin: 4.2 g/dL (ref 3.6–5.1)
Alkaline phosphatase (APISO): 46 U/L (ref 31–125)
BUN: 13 mg/dL (ref 7–25)
CO2: 28 mmol/L (ref 20–32)
Calcium: 9.4 mg/dL (ref 8.6–10.2)
Chloride: 102 mmol/L (ref 98–110)
Creat: 0.8 mg/dL (ref 0.50–1.10)
GFR, Est African American: 120 mL/min/{1.73_m2} (ref >=60)
GFR, Est Non African American: 103 mL/min/{1.73_m2} (ref >=60)
Globulin: 3.1 g/dL (calc) (ref 1.9–3.7)
Glucose, Bld: 83 mg/dL (ref 65–99)
Potassium: 4 mmol/L (ref 3.5–5.3)
Sodium: 138 mmol/L (ref 135–146)
Total Bilirubin: 0.3 mg/dL (ref 0.2–1.2)
Total Protein: 7.3 g/dL (ref 6.1–8.1)

## 2018-12-29 LAB — CBC WITH DIFFERENTIAL/PLATELET
Absolute Monocytes: 716 cells/uL (ref 200–950)
Basophils Absolute: 62 cells/uL (ref 0–200)
Basophils Relative: 0.8 %
Eosinophils Absolute: 416 cells/uL (ref 15–500)
Eosinophils Relative: 5.4 %
HCT: 36.3 % (ref 35.0–45.0)
Hemoglobin: 12.4 g/dL (ref 11.7–15.5)
Lymphs Abs: 2972 cells/uL (ref 850–3900)
MCH: 29.9 pg (ref 27.0–33.0)
MCHC: 34.2 g/dL (ref 32.0–36.0)
MCV: 87.5 fL (ref 80.0–100.0)
MPV: 11.5 fL (ref 7.5–12.5)
Monocytes Relative: 9.3 %
Neutro Abs: 3534 cells/uL (ref 1500–7800)
Neutrophils Relative %: 45.9 %
Platelets: 259 10*3/uL (ref 140–400)
RBC: 4.15 10*6/uL (ref 3.80–5.10)
RDW: 12.1 % (ref 11.0–15.0)
Total Lymphocyte: 38.6 %
WBC: 7.7 10*3/uL (ref 3.8–10.8)

## 2018-12-29 LAB — IRON, TOTAL/TOTAL IRON BINDING CAP
%SAT: 12 % (calc) — ABNORMAL LOW (ref 16–45)
Iron: 40 ug/dL (ref 40–190)
TIBC: 346 mcg/dL (calc) (ref 250–450)

## 2018-12-29 LAB — VITAMIN B12: Vitamin B-12: 401 pg/mL (ref 200–1100)

## 2018-12-29 LAB — TSH: TSH: 1.45 mIU/L

## 2018-12-29 LAB — VITAMIN D 25 HYDROXY (VIT D DEFICIENCY, FRACTURES): Vit D, 25-Hydroxy: 23 ng/mL — ABNORMAL LOW (ref 30–100)

## 2018-12-29 LAB — LIPID PANEL
Cholesterol: 252 mg/dL — ABNORMAL HIGH (ref <200)
HDL: 46 mg/dL — ABNORMAL LOW (ref >=50)
LDL Cholesterol (Calc): 177 mg/dL (calc) — ABNORMAL HIGH
Non-HDL Cholesterol (Calc): 206 mg/dL (calc) — ABNORMAL HIGH (ref <130)
Total CHOL/HDL Ratio: 5.5 (calc) — ABNORMAL HIGH (ref <5.0)
Triglycerides: 144 mg/dL (ref <150)

## 2019-01-17 ENCOUNTER — Other Ambulatory Visit: Payer: Self-pay

## 2019-01-17 ENCOUNTER — Encounter: Payer: Self-pay | Admitting: Adult Health

## 2019-01-17 ENCOUNTER — Ambulatory Visit: Payer: BC Managed Care – PPO | Admitting: Adult Health

## 2019-01-17 VITALS — BP 120/78 | HR 75 | Temp 96.4°F | Ht 61.0 in | Wt 184.0 lb

## 2019-01-17 DIAGNOSIS — J029 Acute pharyngitis, unspecified: Secondary | ICD-10-CM | POA: Diagnosis not present

## 2019-01-17 MED ORDER — PREDNISONE 20 MG PO TABS: ORAL_TABLET | ORAL | 0 refills | Status: DC

## 2019-01-17 NOTE — Patient Instructions (Signed)
-  Make sure you are drinking plenty of fluids to stay hydrated.  -while drinking fluids pinch and hold nose close and swallow, to help open eustachian tubes and drain fluid behind ear drums. -you can do salt water gargles. You can also do 1 TSP liquid Maalox and 1 TSP liquid benadryl- mix/ gargle/ spit  If you are not feeling better in 10-14 days, or if you develop any worse symptoms, fever/chills then please call the office.  Pharyngitis Pharyngitis is redness, pain, and swelling (inflammation) of your pharynx.  CAUSES  Pharyngitis is usually caused by infection. Most of the time, these infections are from viruses (viral) and are part of a cold. However, sometimes pharyngitis is caused by bacteria (bacterial). Pharyngitis can also be caused by allergies. Viral pharyngitis may be spread from person to person by coughing, sneezing, and personal items or utensils (cups, forks, spoons, toothbrushes). Bacterial pharyngitis may be spread from person to person by more intimate contact, such as kissing.  SIGNS AND SYMPTOMS  Symptoms of pharyngitis include:   Sore throat.   Tiredness (fatigue).   Low-grade fever.   Headache.  Joint pain and muscle aches.  Skin rashes.  Swollen lymph nodes.  Plaque-like film on throat or tonsils (often seen with bacterial pharyngitis). DIAGNOSIS  Your health care provider will ask you questions about your illness and your symptoms. Your medical history, along with a physical exam, is often all that is needed to diagnose pharyngitis. Sometimes, a rapid strep test is done. Other lab tests may also be done, depending on the suspected cause.  TREATMENT  Viral pharyngitis will usually get better in 3-4 days without the use of medicine. Bacterial pharyngitis is treated with medicines that kill germs (antibiotics).  HOME CARE INSTRUCTIONS   Drink enough water and fluids to keep your urine clear or pale yellow.   Only take over-the-counter or  prescription medicines as directed by your health care provider:   If you are prescribed antibiotics, make sure you finish them even if you start to feel better.   Do not take aspirin.   Get lots of rest.   Gargle with 8 oz of salt water ( tsp of salt per 1 qt of water) as often as every 1-2 hours to soothe your throat.   Throat lozenges (if you are not at risk for choking) or sprays may be used to soothe your throat. SEEK MEDICAL CARE IF:   You have large, tender lumps in your neck.  You have a rash.  You cough up green, yellow-brown, or bloody spit. SEEK IMMEDIATE MEDICAL CARE IF:   Your neck becomes stiff.  You drool or are unable to swallow liquids.  You vomit or are unable to keep medicines or liquids down.  You have severe pain that does not go away with the use of recommended medicines.  You have trouble breathing (not caused by a stuffy nose). MAKE SURE YOU:   Understand these instructions.  Will watch your condition.  Will get help right away if you are not doing well or get worse. Document Released: 03/02/2005 Document Revised: 12/21/2012 Document Reviewed: 11/07/2012 [facility] Patient Information 2015 [place], [place]. This information is not intended to replace advice given to you by your health care provider. Make sure you discuss any questions you have with your health care provider.

## 2019-01-17 NOTE — Progress Notes (Signed)
Assessment and Plan:  Beth Thomas was seen today for sore throat.  Diagnoses and all orders for this visit:  Acute pharyngitis, unspecified etiology Exam is reassuring/benign today Discussed the importance of avoiding unnecessary antibiotic therapy. Most suggestive of mild viral etiology or possibly r/t allergic rhinitis/post-nasal drip Take medications as prescribed, increase fluids,  Salt water gargles. Suggested symptomatic OTC remedies. Nasal saline spray for congestion. oral steroids offered If symptoms do not improve in 5-7 days or get worse (headache, fever/chills, worsening sore throat) contact the office and will initiate abx.  Follow up as needed -     predniSONE (DELTASONE) 20 MG tablet; 2 tablets daily for 3 days, 1 tablet daily for 4 days.  Further disposition pending results of labs. Discussed med's effects and SE's.   Over 15 minutes of exam, counseling, chart review, and critical decision making was performed.   Future Appointments  Date Time Provider Department Center  06/29/2019 10:00 AM Quentin Mulling, PA-C [facility] None    ------------------------------------------------------------------------------------------------------------------   HPI BP 120/78   Pulse 75   Temp (!) 96.4 F (35.8 C)   Ht 5\' 1"  (1.549 m)   Wt 184 lb (83.5 kg)   SpO2 98%   BMI 34.77 kg/m   23 y.o.female with hx of allergic rhinitis presents for evaluation of a sore throat.   She reports began Sunday night (3 nights ago), kept waking her up, quite severe, like swallowing razor blades, similar yesterday but reports improved today.  Denies fever/chills, fatigue, HA, swollen lymph nodes, cough, dyspnea, difficulty swallowing, though does endorse ongoing nasal congestion and post-nasal drip which is intermittently ongoing.    She did salt water gargle last night which did seem to improve sx some, has been chewing cough drops.   She had tonsillectomy/adenoidectomy with myringotomy in  2017 due to recurrent strep. She reports no strep infections since that time. She believes current symptoms are related to allergies, "but just wanted to get your opinion."   She does endorse hx of allergies, takes zyrtec 10 mg daily, hasn't rotated  Not currently doing sprays but does do saline irrigations in winter which she hasn't started. Reports hx of nose bleeds and avoids medicated nasal sprays.   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   Past Medical History:  Diagnosis Date  . Allergy   . Unspecified vitamin D deficiency      No Known Allergies  Current Outpatient Medications on File Prior to Visit  Medication Sig  . Cetirizine HCl (ZYRTEC ALLERGY) 10 MG CAPS Take 10 mg by mouth daily.  . citalopram (CELEXA) 20 MG tablet Take 1 tablet Daily for Mood / Anxiety  . cyclobenzaprine (FLEXERIL) 10 MG tablet Take 1 tablet (10 mg total) by mouth 3 (three) times daily as needed for muscle spasms.  . diclofenac (VOLTAREN) 75 MG EC tablet Take 1 tablet (75 mg total) by mouth 2 (two) times daily between meals as needed.  . drospirenone-ethinyl estradiol (SYEDA) 3-0.03 MG tablet Take 1 tablet Daily  . polyethylene glycol powder (GLYCOLAX/MIRALAX) powder DISSOLVE 17 GRAMS IN LIQUID AND DRINK AS DIRECTED BY PRESCRIBER   No current facility-administered medications on file prior to visit.     ROS: all negative except above.   Physical Exam:  BP 120/78   Pulse 75   Temp (!) 96.4 F (35.8 C)   Ht 5\' 1"  (1.549 m)   Wt 184 lb (83.5 kg)   SpO2 98%  BMI 34.77 kg/m   General Appearance: Well nourished, in no apparent distress. Eyes: PERRLA, conjunctiva no swelling or erythema Sinuses: No Frontal/maxillary tenderness ENT/Mouth: Ext aud canals clear, TMs without erythema, bulging. Post pharynx mildly erythematous without swelling, or exudate on post pharynx.  Tonsils absent. Hearing normal.  Neck: Supple Respiratory: Respiratory effort  normal, BS equal bilaterally without rales, rhonchi, wheezing or stridor.  Cardio: RRR with no MRGs. Brisk peripheral pulses without edema.  Abdomen: Soft, + BS.  Non tender. Lymphatics: Non tender without lymphadenopathy.  Musculoskeletal: normal gait.  Skin: Warm, dry without rashes, lesions, ecchymosis.  Neuro:  Normal muscle tone,  Psych: Awake and oriented X 3, normal affect, Insight and Judgment appropriate.     [name], NP 12:45 PM [facility]

## 2019-06-11 ENCOUNTER — Other Ambulatory Visit: Payer: Self-pay | Admitting: Internal Medicine

## 2019-06-11 DIAGNOSIS — F419 Anxiety disorder, unspecified: Secondary | ICD-10-CM

## 2019-06-11 DIAGNOSIS — Z30011 Encounter for initial prescription of contraceptive pills: Secondary | ICD-10-CM

## 2019-06-11 MED ORDER — CITALOPRAM HYDROBROMIDE 20 MG PO TABS: ORAL_TABLET | ORAL | 0 refills | Status: DC

## 2019-06-11 MED ORDER — DROSPIRENONE-ETHINYL ESTRADIOL 3-0.03 MG PO TABS: ORAL_TABLET | ORAL | 3 refills | Status: DC

## 2019-06-27 DIAGNOSIS — E782 Mixed hyperlipidemia: Secondary | ICD-10-CM | POA: Insufficient documentation

## 2019-06-27 NOTE — Progress Notes (Deleted)
Complete Physical  Assessment and Plan: Other abnormal glucose -     Hemoglobin A1c Discussed disease progression and risks Discussed diet/exercise, weight management and risk modification  Vitamin D deficiency -     VITAMIN D 25 Hydroxy (Vit-D Deficiency, Fractures) Continue supplement  Allergic rhinitis, unspecified seasonality, unspecified trigger Get on nasal spray  Anxiety -     TSH Continue celexa  Hyperlipidemia -     Lipid panel check lipids decrease fatty foods increase activity.   Routine general medical examination at a health care facility 1 year  Medication management -     CBC with Differential/Platelet -     COMPLETE METABOLIC PANEL WITH GFR -     Magnesium  Screening for cervical cancer -     PAP  Screening for hematuria or proteinuria -     Urinalysis, Routine w reflex microscopic -     Microalbumin / creatinine urine ratio  Screening, anemia, deficiency, iron -     Iron,Total/Total Iron Binding Cap -     Ferritin -     Vitamin B12  Discussed med's effects and SE's. Screening labs and tests as requested with regular follow-up as recommended. Over 40 minutes of exam, counseling, chart review and critical decision making was performed  HPI  This very nice 24 y.o.female presents for complete physical.  Patient has no major health issues.  Patient reports no complaints at this time.   She is still working at [employer] right now 3-4 days a week.   She does workout, she is trying to do 5 days a week.    She is not dating anyone, declines STD testing, she is on BCP.  Finally, patient has history of Vitamin D Deficiency, not on medication consistently.  Lab Results  Component Value Date   VD25OH 23 (L) 12/28/2018  '  BMI is There is no height or weight on file to calculate BMI., she is working on diet and exercise. Wt Readings from Last 3 Encounters:  01/17/19 184 lb (83.5 kg)  12/28/18 188 lb 6.4 oz (85.5 kg)  09/12/18 173 lb (78.5 kg)    The cholesterol last visit was:   Lab Results  Component Value Date   CHOL 252 (H) 12/28/2018   HDL 46 (L) 12/28/2018   LDLCALC 177 (H) 12/28/2018   TRIG 144 12/28/2018   CHOLHDL 5.5 (H) 12/28/2018   Last A1C in the office was:  Lab Results  Component Value Date   HGBA1C 5.3 06/27/2018   Lab Results  Component Value Date   IRON 40 12/28/2018   TIBC 346 12/28/2018   FERRITIN 13 (L) 06/27/2018   Lab Results  Component Value Date   VITAMINB12 401 12/28/2018    Current Medications:  Current Outpatient Medications on File Prior to Visit  Medication Sig Dispense Refill  . Cetirizine HCl (ZYRTEC ALLERGY) 10 MG CAPS Take 10 mg by mouth daily.    . citalopram (CELEXA) 20 MG tablet Take 1 tablet Daily for Mood / Anxiety 90 tablet 0  . drospirenone-ethinyl estradiol (SYEDA) 3-0.03 MG tablet Take 1 tablet Daily 84 tablet 3  . polyethylene glycol powder (GLYCOLAX/MIRALAX) powder DISSOLVE 17 GRAMS IN LIQUID AND DRINK AS DIRECTED BY PRESCRIBER 765 g 0   No current facility-administered medications on file prior to visit.   Health Maintenance:   Immunization History  Administered Date(s) Administered  . HPV 9-valent 10/10/2014, 12/12/2014, 05/14/2015  . Influenza Inj Mdck Quad With Preservative 11/17/2016  . Influenza-Unspecified 11/17/2016  .  PPD Test 09/12/2018  . Tdap 11/15/2006, 06/04/2016   TD/TDAP: 2018 Influenza: 2018 did not get this past Pneumovax: N/a Prevnar 13: N/a HPV vaccine  completed  LMP: No LMP recorded. Sexually Active: on BCP, not sexually active, declines STD testing.  Pap: 2019 normal, will check yearly until 28 MGM:  [place] left breast    Medical History:  Past Medical History:  Diagnosis Date  . Allergy   . Unspecified vitamin D deficiency    Allergies No Known Allergies  SURGICAL HISTORY She  has a past surgical history that includes Tonsilectomy/adenoidectomy with myringotomy (01/2016). FAMILY HISTORY Her family history includes Diabetes  in her mother; Hyperlipidemia in her father and mother; Hypertension in her father and mother. SOCIAL HISTORY She  reports that she is a non-smoker but has been exposed to tobacco smoke. She has never used smokeless tobacco. She reports that she does not drink alcohol or use drugs. 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   HENT: Negative.   Eyes: Negative.   Respiratory: Negative.   Cardiovascular: Negative.   Gastrointestinal: Negative.   Genitourinary: Negative.   Musculoskeletal: Negative.   Skin: Positive for rash. Negative for itching.  Neurological: Negative.   Endo/Heme/Allergies: Negative.   Psychiatric/Behavioral: Negative.     Physical Exam: Estimated body mass index is 34.77 kg/m as calculated from the following:   Height as of 01/17/19: 5\' 1"  (1.549 m).   Weight as of 01/17/19: 184 lb (83.5 kg). There were no vitals taken for this visit. General Appearance: Well nourished, in no apparent distress.  Eyes: PERRLA, EOMs, conjunctiva no swelling or erythema, normal fundi and vessels. Right outer eye with erythematous scaly area with bumps.   Sinuses: No Frontal/maxillary tenderness  ENT/Mouth: Ext aud canals clear, normal light reflex with TMs without erythema, bulging. Good dentition. No erythema, swelling, or exudate on post pharynx. Tonsils not swollen or erythematous. Hearing normal.  Neck: Supple, thyroid normal. No bruits  Respiratory: Respiratory effort normal, BS equal bilaterally without rales, rhonchi, wheezing or stridor.  Cardio: RRR without murmurs, rubs or gallops. Brisk peripheral pulses without edema.  Chest: symmetric, with normal excursions and percussion.  Breasts: Symmetric, without lumps, nipple discharge, retractions.  Abdomen: Soft, nontender, no guarding, rebound, hernias, masses, or organomegaly.   Lymphatics: Non tender without lymphadenopathy.  Genitourinary: normal external genitalia, vulva, vagina, cervix, uterus and adnexa, PAP: Pap smear done today. Musculoskeletal: Full ROM all peripheral extremities,5/5 strength, and normal gait.  Skin: Warm, dry without rashes, lesions, ecchymosis. Neuro: Cranial nerves intact, reflexes equal bilaterally. Normal muscle tone, no cerebellar symptoms. Sensation intact.  Psych: Awake and oriented X 3, normal affect, Insight and Judgment appropriate.   EKG: defer  Vicie Mutters 1:31 PM [facility]

## 2019-06-29 ENCOUNTER — Encounter: Payer: BLUE CROSS/BLUE SHIELD | Admitting: Physician Assistant

## 2019-07-17 NOTE — Progress Notes (Signed)
Complete Physical  Assessment and Plan:  Encounter for general adult medical examination with abnormal findings 1 year  Other abnormal glucose -     TSH -     Hemoglobin A1c Discussed disease progression and risks Discussed diet/exercise, weight management and risk modification  Mixed hyperlipidemia -     TSH -     Lipid panel check lipids decrease fatty foods increase activity.   Anxiety -     TSH  Vitamin D deficiency -     VITAMIN D 25 Hydroxy (Vit-D Deficiency, Fractures)  Allergic rhinitis, unspecified seasonality, unspecified trigger Allergic rhinitis - Allegra OTC, increase H20, allergy hygiene explained.  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  Medication management -     CBC with Differential/Platelet -     COMPLETE METABOLIC PANEL WITH GFR -     Magnesium  B12 deficiency -     Vitamin B12  Iron deficiency -     Iron,Total/Total Iron Binding Cap  Screening for hematuria or proteinuria -     Urinalysis, Routine w reflex microscopic -     Microalbumin / creatinine urine ratio  Screening for cervical cancer -     Pap w/Age Based Scrn w/CT/NG/TRICH  Abnormal menses -     TSH -     RPR -     HIV Antibody (routine testing w rflx) -     hCG, serum, qualitative 1 episode of a very light menses, otherwise normal, check labs, continue the same.   Migraine without aura and without status migrainosus, not intractable -     SUMAtriptan (IMITREX) 100 MG tablet; Take 1 tablet (100 mg total) by mouth once as needed for migraine. May repeat in 2 hours if headache persists or recurs. If worse or not better let us know Can not use more than 2 a week.     Discussed med's effects and SE's. Screening labs and tests as requested with regular follow-up as recommended. Over 40 minutes of exam, counseling, chart review and critical decision making was performed  HPI  This very nice 24 y.o.female presents for complete physical.   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   She has been having more migraines for the past month, once a week, happen in afternoon, with nausea, photophobia and phonophobia. She will go home and sleep but if that does not help will take [facility].   She is sexually active, with BCP she has been having very regular menses. LMP end of march, normal, this most recent menses she was a few days late and lighter than usual. Has done 2 negative preg test.   She does workout, she is trying to do 5 days a week.    Finally, patient has history of Vitamin D Deficiency, not on medication consistently.  Lab Results  Component Value Date   VD25OH 23 (L) 12/28/2018  '  BMI is Body mass index is 32.1 kg/m., she is working on diet and exercise. Wt Readings from Last 3 Encounters:  07/19/19 187 lb (84.8 kg)  01/17/19 184 lb (83.5 kg)  12/28/18 188 lb 6.4 oz (85.5 kg)   The cholesterol last visit was:   Lab Results  Component Value Date   CHOL 252 (H) 12/28/2018   HDL 46 (L) 12/28/2018   LDLCALC 177 (H) 12/28/2018  TRIG 144 12/28/2018   CHOLHDL 5.5 (H) 12/28/2018   Last A1C in the office was:  Lab Results  Component Value Date   HGBA1C 5.3 06/27/2018   Lab Results  Component Value Date   IRON 40 12/28/2018   TIBC 346 12/28/2018   FERRITIN 13 (L) 06/27/2018   Lab Results  Component Value Date   VITAMINB12 401 12/28/2018    Current Medications:  Current Outpatient Medications on File Prior to Visit  Medication Sig Dispense Refill  . Cetirizine HCl (ZYRTEC ALLERGY) 10 MG CAPS Take 10 mg by mouth daily.    . citalopram (CELEXA) 20 MG tablet Take 1 tablet Daily for Mood / Anxiety 90 tablet 0  . drospirenone-ethinyl estradiol (SYEDA) 3-0.03 MG tablet Take 1 tablet Daily 84 tablet 3  . polyethylene glycol powder (GLYCOLAX/MIRALAX) powder DISSOLVE 17 GRAMS IN LIQUID AND DRINK AS DIRECTED BY PRESCRIBER  765 g 0   No current facility-administered medications on file prior to visit.   Health Maintenance:   Immunization History  Administered Date(s) Administered  . HPV 9-valent 10/10/2014, 12/12/2014, 05/14/2015  . Influenza Inj Mdck Quad With Preservative 11/17/2016  . Influenza-Unspecified 11/17/2016  . PPD Test 09/12/2018  . Tdap 11/15/2006, 06/04/2016   TD/TDAP: 2018 Influenza: 2018 did not get this past Pneumovax: N/a Prevnar 13: N/a HPV vaccine  Completed COVID vaccine thinking about it  LMP: No LMP recorded. Sexually Active: on BCP, she is sexually active, will get STD screening Pap: 2020 normal, will check yearly until 28 MGM:  [place] left breast    Medical History:  Past Medical History:  Diagnosis Date  . Allergy   . Unspecified vitamin D deficiency    Allergies No Known Allergies  SURGICAL HISTORY She  has a past surgical history that includes Tonsilectomy/adenoidectomy with myringotomy (01/2016). FAMILY HISTORY Her family history includes Diabetes in her mother; Hyperlipidemia in her father and mother; Hypertension in her father and mother. SOCIAL HISTORY She  reports that she is a non-smoker but has been exposed to tobacco smoke. She has never used smokeless tobacco. She reports that she does not drink alcohol or use drugs. 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   HENT: Negative.   Eyes: Negative.   Respiratory: Negative.   Cardiovascular: Negative.   Gastrointestinal: Negative.   Genitourinary: Negative.   Musculoskeletal: Negative.   Skin: Positive for rash. Negative for itching.  Neurological: Negative.   Endo/Heme/Allergies: Negative.   Psychiatric/Behavioral: Negative.     Physical Exam: Estimated body mass index is 32.1 kg/m as calculated from the following:   Height as of this encounter:  5\' 4"  (1.626 m).   Weight as of this encounter: 187 lb (84.8 kg). BP 118/74   Pulse 68   Temp (!) 97.5 F (36.4 C)   Ht 5\' 4"  (1.626 m)   Wt 187 lb (84.8 kg)   SpO2 98%   BMI 32.10 kg/m  General Appearance: Well nourished, in no apparent distress.  Eyes: PERRLA, EOMs, conjunctiva no swelling or erythema, normal fundi and vessels. Right outer eye with erythematous scaly area with bumps.   Sinuses: No Frontal/maxillary tenderness  ENT/Mouth: Ext aud canals clear, normal light reflex with TMs without erythema, bulging. Good dentition. No erythema, swelling, or exudate on post pharynx. Tonsils not swollen or erythematous. Hearing normal.  Neck: Supple, thyroid normal. No  bruits  Respiratory: Respiratory effort normal, BS equal bilaterally without rales, rhonchi, wheezing or stridor.  Cardio: RRR without murmurs, rubs or gallops. Brisk peripheral pulses without edema.  Chest: symmetric, with normal excursions and percussion.  Breasts: Symmetric, without lumps, nipple discharge, retractions.  Abdomen: Soft, nontender, no guarding, rebound, hernias, masses, or organomegaly.  Lymphatics: Non tender without lymphadenopathy.  Genitourinary: normal external genitalia, vulva, vagina, cervix, uterus and adnexa, PAP: Pap smear done today. Musculoskeletal: Full ROM all peripheral extremities,5/5 strength, and normal gait.  Skin: Warm, dry without rashes, lesions, ecchymosis. Neuro: Cranial nerves intact, reflexes equal bilaterally. Normal muscle tone, no cerebellar symptoms. Sensation intact.  Psych: Awake and oriented X 3, normal affect, Insight and Judgment appropriate.   EKG: defer  Vicie Mutters 3:17 PM [facility]

## 2019-07-19 ENCOUNTER — Other Ambulatory Visit: Payer: Self-pay

## 2019-07-19 ENCOUNTER — Encounter: Payer: Self-pay | Admitting: Physician Assistant

## 2019-07-19 ENCOUNTER — Ambulatory Visit: Payer: BC Managed Care – PPO | Admitting: Physician Assistant

## 2019-07-19 VITALS — BP 118/74 | HR 68 | Temp 97.5°F | Ht 64.0 in | Wt 187.0 lb

## 2019-07-19 DIAGNOSIS — B977 Papillomavirus as the cause of diseases classified elsewhere: Secondary | ICD-10-CM

## 2019-07-19 DIAGNOSIS — N926 Irregular menstruation, unspecified: Secondary | ICD-10-CM | POA: Diagnosis not present

## 2019-07-19 DIAGNOSIS — J309 Allergic rhinitis, unspecified: Secondary | ICD-10-CM

## 2019-07-19 DIAGNOSIS — Z131 Encounter for screening for diabetes mellitus: Secondary | ICD-10-CM

## 2019-07-19 DIAGNOSIS — Z0000 Encounter for general adult medical examination without abnormal findings: Secondary | ICD-10-CM | POA: Diagnosis not present

## 2019-07-19 DIAGNOSIS — E559 Vitamin D deficiency, unspecified: Secondary | ICD-10-CM | POA: Diagnosis not present

## 2019-07-19 DIAGNOSIS — Z124 Encounter for screening for malignant neoplasm of cervix: Secondary | ICD-10-CM

## 2019-07-19 DIAGNOSIS — Z1159 Encounter for screening for other viral diseases: Secondary | ICD-10-CM

## 2019-07-19 DIAGNOSIS — E611 Iron deficiency: Secondary | ICD-10-CM

## 2019-07-19 DIAGNOSIS — Z1389 Encounter for screening for other disorder: Secondary | ICD-10-CM | POA: Diagnosis not present

## 2019-07-19 DIAGNOSIS — Z130 Encounter for screening for diseases of the blood and blood-forming organs and certain disorders involving the immune mechanism: Secondary | ICD-10-CM

## 2019-07-19 DIAGNOSIS — Z1329 Encounter for screening for other suspected endocrine disorder: Secondary | ICD-10-CM

## 2019-07-19 DIAGNOSIS — F419 Anxiety disorder, unspecified: Secondary | ICD-10-CM

## 2019-07-19 DIAGNOSIS — G43009 Migraine without aura, not intractable, without status migrainosus: Secondary | ICD-10-CM

## 2019-07-19 DIAGNOSIS — E538 Deficiency of other specified B group vitamins: Secondary | ICD-10-CM

## 2019-07-19 DIAGNOSIS — R7309 Other abnormal glucose: Secondary | ICD-10-CM

## 2019-07-19 DIAGNOSIS — Z79899 Other long term (current) drug therapy: Secondary | ICD-10-CM | POA: Diagnosis not present

## 2019-07-19 DIAGNOSIS — Z0001 Encounter for general adult medical examination with abnormal findings: Secondary | ICD-10-CM

## 2019-07-19 DIAGNOSIS — E782 Mixed hyperlipidemia: Secondary | ICD-10-CM

## 2019-07-19 DIAGNOSIS — Z13220 Encounter for screening for lipoid disorders: Secondary | ICD-10-CM | POA: Diagnosis not present

## 2019-07-19 MED ORDER — SUMATRIPTAN SUCCINATE 100 MG PO TABS: 100.0000 mg | ORAL_TABLET | Freq: Once | ORAL | 0 refills | Status: DC | PRN

## 2019-07-19 NOTE — Patient Instructions (Addendum)
1.  Limit use of pain relievers to no more than 2 days out of week to prevent risk of rebound or medication-overuse headache. 2.  Keep headache diary 3.  Exercise, hydration, caffeine cessation, sleep hygiene, monitor for and avoid triggers 4.  Consider:  magnesium citrate 400mg daily, riboflavin 400mg daily, and coenzyme Q10 100mg three times daily   We may also treat TMJ if we think you have it If you are having frequent migraines we may put you on a once a day medication with fast acting medication to take. Also there is such a thing called rebound headache from over use of acute medications.  Please do not use rescue or acute medications more than 10 days a month or more than 3 days per week, this can cause a withdrawal and a rebound headache.  Here is more information below  Please remember, common headache triggers are: sleep deprivation, dehydration, overheating, stress, hypoglycemia or skipping meals and blood sugar fluctuations, excessive pain medications or excessive alcohol use or caffeine withdrawal. Some people have food triggers such as aged cheese, orange juice or chocolate, especially dark chocolate, or MSG (monosodium glutamate). Try to avoid these headache triggers as much possible.   It may be helpful to keep a headache diary to figure out what makes your headaches worse or brings them on and what alleviates them. Some people report headache onset after exercise but studies have shown that regular exercise may actually prevent headaches from coming. If you have exercise-induced headaches, please make sure that you drink plenty of fluid before and after exercising and that you do not over do it and do not overheat.   Please go to the ER if there is weakness, thunderclap headache, visual changes, or any concerning factors    Migraine Headache A migraine headache is an intense, throbbing pain on one or both sides of your head. Recurrent migraines keep coming back. A migraine  can last for 30 minutes to several hours. CAUSES  The exact cause of a migraine headache is not always known. However, a migraine may be caused when nerves in the brain become irritated and release chemicals that cause inflammation. This causes pain. Certain things may also trigger migraines, such as:   Alcohol.  Smoking.  Stress.  Menstruation.  Aged cheeses.  Foods or drinks that contain nitrates, glutamate, aspartame, or tyramine.  Lack of sleep.  Chocolate.  Caffeine.  Hunger.  Physical exertion.  Fatigue.  Medicines used to treat chest pain (nitroglycerine), birth control pills, estrogen, and some blood pressure medicines. SYMPTOMS   Pain on one or both sides of your head.  Pulsating or throbbing pain.  Severe pain that prevents daily activities.  Pain that is aggravated by any physical activity.  Nausea, vomiting, or both.  Dizziness.  Pain with exposure to bright lights, loud noises, or activity.  General sensitivity to bright lights, loud noises, or smells. Before you get a migraine, you may get warning signs that a migraine is coming (aura). An aura may include:  Seeing flashing lights.  Seeing bright spots, halos, or zigzag lines.  Having tunnel vision or blurred vision.  Having feelings of numbness or tingling.  Having trouble talking.  Having muscle weakness. DIAGNOSIS  A recurrent migraine headache is often diagnosed based on:  Symptoms.  Physical examination.  A CT scan or MRI of your head. These imaging tests cannot diagnose migraines but can help rule out other causes of headaches.   TREATMENT  Medicines may be given   for pain and nausea. Medicines can also be given to help prevent recurrent migraines. HOME CARE INSTRUCTIONS  Only take over-the-counter or prescription medicines for pain or discomfort as directed by your health care provider. The use of long-term narcotics is not recommended.  Lie down in a dark, quiet room when  you have a migraine.  Keep a journal to find out what may trigger your migraine headaches. For example, write down:  What you eat and drink.  How much sleep you get.  Any change to your diet or medicines.  Limit alcohol consumption.  Quit smoking if you smoke.  Get 7-9 hours of sleep, or as recommended by your health care provider.  Limit stress.  Keep lights dim if bright lights bother you and make your migraines worse. SEEK MEDICAL CARE IF:   You do not get relief from the medicines given to you.  You have a recurrence of pain.  You have a fever. SEEK IMMEDIATE MEDICAL CARE IF:  Your migraine becomes severe.  You have a stiff neck.  You have loss of vision.  You have muscular weakness or loss of muscle control.  You start losing your balance or have trouble walking.  You feel faint or pass out. . You have severe symptoms that are different from your first symptoms. MAKE SURE YOU:   Understand these instructions.  Will watch your condition.  Will get help right away if you are not doing well or get worse.   This information is not intended to replace advice given to you by your health care provider. Make sure you discuss any questions you have with your health care provider.   Document Released: 11/25/2000 Document Revised: 03/23/2014 Document Reviewed: 11/07/2012 Elsevier Interactive Patient Education 2016 [employer].  Common Migraine Triggers   Foods Aged cheese, alcohol, nuts, chocolate, yogurt, onions, figs, liver, caffeinated foods and beverages, monosodium glutamate (MSG), smoked or pickled fish/meat, nitrate/nitrate preserved foods (hotdogs, pepperoni, salami) tyramine  Medications Antibiotics (tetracycline, griseofulvin), antihypertensives (nifedipine, captopril), hormones (oral contraceptives, estrogens), histamine-2 blockers (cimetidine, raniidine, vasodilators (nitroglycerine, isosorbide dinitrate)  Sensory Stimuli Flickering/bright/fluorescent  lights, bright sunlight, odors (perfume, chemicals, cigarette smoke)  Lifestyle Changes Time zones, sleep patterns, eating habits, caffeine withdrawal stress  Other Menstrual cycle, weather/season/air pressure changes, high altitude  Adapted from Farrell and [place], [place]. Clin. J. Med. 1995; Rapoport and [place]. Conquering Headache, 1998  Hormonal variations also are believed to play a part.  Fluctuations of the female hormone estrogen (such as just before menstruation) affect a chemical called serotonin-when serotonin levels in the brain fall, the dilation (expansion) of blood vessels in the brain that is characteristic of migraine often follows.  Many factors or "triggers" can start a migraine.  In people who get migraines, most experts think certain activities or foods may trigger temporary changes in the blood vessels around the brain.  Swelling of these blood vessels may cause pain in the nearby nerves.  Allergy Headaches:  Hotdogs Milk  Onions  Thyme Bacon  Chocolate Garlic  Nutmeg Ham  Dark Cola Pork  Cinnamon Salami  Nuts  Egg  Ginger Sausage Red wine Cloves  Cheddar Cheese Caffeine   Here is some information about the vaccines. The data is very good and this information hopefully answers a lot of your questions and give you a confidence boost.   The Pfizer and Moderna vaccines are messenger RNA vaccines. That technology is not new, it has been studied for 20 years at least, used for cancer and MS  treatment. They had started using the vaccine for MERS AND SARS (both a different coronavirus) 10 years ago but never finished so we had a good backbone for this vaccine.   There were no short cuts with the techniques for these clinical  trials, just lots of willing participants quickly and lots of up front money helped speed up the process.   The mRNA is very fragile which is why it needs to be kept so cold and thawed a certain way, think of it as a message in a glass bottle. NO PART of  the virus is in this vaccine, it is a clip of the genetic sequence. This mRNA is injected in your arm, connects with a ribosome, delivers the message and the degrades.  That is part of the cause of the sore arm, the mRNA never leaves your arm. It degrades there. The mRNA does not go into our nucleotide where our DNA is, and we would need a DNA reverse transcriptase to take RNA to DNA, we do not have this, it can not change our DNA. The ribosome that got the message creates a protein and that protein circulates in our body and we have an immune reaction to that creating antibodies. Any time our immune system is triggered, inflammation is triggered too so you can have a temp, muscle aches, etc. Normal reaction.  I have seen so many patients that just had mild COVID in the office weeks later still have issues. We are still learning about post COVID syndrome, the CDC should be coming out for guidelines for practioners soon. There is too much unknown about COVID. We have been using vaccines for over 100 years or more, i Personnel officer. Ask your parents or any older friends about polio and that vaccine, that was a disease shutting down schools,had kids in iron lung, devastating young kids and families. People lined up for that vaccine and technology has only improved.   Please get the vaccine. If you have any further questions please make an appointment in the office to discuss further.  Estill Bamberg

## 2019-07-20 LAB — COMPLETE METABOLIC PANEL WITH GFR
AG Ratio: 1.1 (calc) (ref 1.0–2.5)
ALT: 10 U/L (ref 6–29)
AST: 15 U/L (ref 10–30)
Albumin: 3.9 g/dL (ref 3.6–5.1)
Alkaline phosphatase (APISO): 50 U/L (ref 31–125)
BUN: 15 mg/dL (ref 7–25)
CO2: 26 mmol/L (ref 20–32)
Calcium: 9.6 mg/dL (ref 8.6–10.2)
Chloride: 103 mmol/L (ref 98–110)
Creat: 0.67 mg/dL (ref 0.50–1.10)
GFR, Est African American: 142 mL/min/{1.73_m2} (ref >=60)
GFR, Est Non African American: 122 mL/min/{1.73_m2} (ref >=60)
Globulin: 3.5 g/dL (calc) (ref 1.9–3.7)
Glucose, Bld: 94 mg/dL (ref 65–99)
Potassium: 3.9 mmol/L (ref 3.5–5.3)
Sodium: 137 mmol/L (ref 135–146)
Total Bilirubin: 0.2 mg/dL (ref 0.2–1.2)
Total Protein: 7.4 g/dL (ref 6.1–8.1)

## 2019-07-20 LAB — IRON, TOTAL/TOTAL IRON BINDING CAP
%SAT: 14 % (calc) — ABNORMAL LOW (ref 16–45)
Iron: 63 ug/dL (ref 40–190)
TIBC: 449 mcg/dL (calc) (ref 250–450)

## 2019-07-20 LAB — MAGNESIUM: Magnesium: 2.2 mg/dL (ref 1.5–2.5)

## 2019-07-20 LAB — CBC WITH DIFFERENTIAL/PLATELET
Absolute Monocytes: 562 cells/uL (ref 200–950)
Basophils Absolute: 38 cells/uL (ref 0–200)
Basophils Relative: 0.5 %
Eosinophils Absolute: 190 cells/uL (ref 15–500)
Eosinophils Relative: 2.5 %
HCT: 40.6 % (ref 35.0–45.0)
Hemoglobin: 13.3 g/dL (ref 11.7–15.5)
Lymphs Abs: 3504 cells/uL (ref 850–3900)
MCH: 29.4 pg (ref 27.0–33.0)
MCHC: 32.8 g/dL (ref 32.0–36.0)
MCV: 89.6 fL (ref 80.0–100.0)
MPV: 11.1 fL (ref 7.5–12.5)
Monocytes Relative: 7.4 %
Neutro Abs: 3306 cells/uL (ref 1500–7800)
Neutrophils Relative %: 43.5 %
Platelets: 287 10*3/uL (ref 140–400)
RBC: 4.53 10*6/uL (ref 3.80–5.10)
RDW: 12.1 % (ref 11.0–15.0)
Total Lymphocyte: 46.1 %
WBC: 7.6 10*3/uL (ref 3.8–10.8)

## 2019-07-20 LAB — LIPID PANEL
Cholesterol: 262 mg/dL — ABNORMAL HIGH (ref <200)
HDL: 52 mg/dL (ref >=50)
LDL Cholesterol (Calc): 172 mg/dL (calc) — ABNORMAL HIGH
Non-HDL Cholesterol (Calc): 210 mg/dL (calc) — ABNORMAL HIGH (ref <130)
Total CHOL/HDL Ratio: 5 (calc) — ABNORMAL HIGH (ref <5.0)
Triglycerides: 227 mg/dL — ABNORMAL HIGH (ref <150)

## 2019-07-20 LAB — HEMOGLOBIN A1C
Hgb A1c MFr Bld: 5.1 % of total Hgb (ref <5.7)
Mean Plasma Glucose: 100 (calc)
eAG (mmol/L): 5.5 (calc)

## 2019-07-20 LAB — URINALYSIS, ROUTINE W REFLEX MICROSCOPIC
Bilirubin Urine: NEGATIVE
Glucose, UA: NEGATIVE
Hgb urine dipstick: NEGATIVE
Ketones, ur: NEGATIVE
Leukocytes,Ua: NEGATIVE
Nitrite: NEGATIVE
Protein, ur: NEGATIVE
Specific Gravity, Urine: 1.024 (ref 1.001–1.03)
pH: 6 (ref 5.0–8.0)

## 2019-07-20 LAB — HIV ANTIBODY (ROUTINE TESTING W REFLEX): HIV 1&2 Ab, 4th Generation: NONREACTIVE

## 2019-07-20 LAB — VITAMIN B12: Vitamin B-12: 339 pg/mL (ref 200–1100)

## 2019-07-20 LAB — RPR: RPR Ser Ql: NONREACTIVE

## 2019-07-20 LAB — MICROALBUMIN / CREATININE URINE RATIO
Creatinine, Urine: 101 mg/dL (ref 20–275)
Microalb Creat Ratio: 4 mcg/mg creat (ref <30)
Microalb, Ur: 0.4 mg/dL

## 2019-07-20 LAB — VITAMIN D 25 HYDROXY (VIT D DEFICIENCY, FRACTURES): Vit D, 25-Hydroxy: 27 ng/mL — ABNORMAL LOW (ref 30–100)

## 2019-07-20 LAB — HCG, SERUM, QUALITATIVE: Preg, Serum: NEGATIVE

## 2019-07-20 LAB — TSH: TSH: 1.85 mIU/L

## 2019-07-22 LAB — C. TRACHOMATIS/N. GONORRHOEAE RNA
C. trachomatis RNA, TMA: NOT DETECTED
N. gonorrhoeae RNA, TMA: NOT DETECTED

## 2019-07-22 LAB — PAP IG W/ RFLX HPV ASCU

## 2019-07-22 LAB — T. VAGINALIS RNA, QL TMA, PAP VIAL: T. vaginalis RNA, QL TMA: NOT DETECTED

## 2019-07-22 LAB — PAP W/AGE BASED SCRN W/CT/NG/TRICH

## 2019-09-26 ENCOUNTER — Other Ambulatory Visit: Payer: Self-pay | Admitting: Internal Medicine

## 2019-09-26 DIAGNOSIS — F419 Anxiety disorder, unspecified: Secondary | ICD-10-CM

## 2019-12-27 ENCOUNTER — Ambulatory Visit (INDEPENDENT_AMBULATORY_CARE_PROVIDER_SITE_OTHER): Payer: BC Managed Care – PPO | Admitting: Adult Health Nurse Practitioner

## 2019-12-27 ENCOUNTER — Other Ambulatory Visit: Payer: Self-pay

## 2019-12-27 ENCOUNTER — Encounter: Payer: Self-pay | Admitting: Adult Health Nurse Practitioner

## 2019-12-27 VITALS — Temp 98.4°F

## 2019-12-27 DIAGNOSIS — F419 Anxiety disorder, unspecified: Secondary | ICD-10-CM | POA: Diagnosis not present

## 2019-12-27 DIAGNOSIS — Z79899 Other long term (current) drug therapy: Secondary | ICD-10-CM | POA: Diagnosis not present

## 2019-12-27 DIAGNOSIS — Z1152 Encounter for screening for COVID-19: Secondary | ICD-10-CM | POA: Diagnosis not present

## 2019-12-27 DIAGNOSIS — J0141 Acute recurrent pansinusitis: Secondary | ICD-10-CM | POA: Diagnosis not present

## 2019-12-27 LAB — POC COVID19 BINAXNOW: SARS Coronavirus 2 Ag: POSITIVE — AB

## 2019-12-27 MED ORDER — AZITHROMYCIN 250 MG PO TABS: ORAL_TABLET | ORAL | 1 refills | Status: AC

## 2019-12-27 NOTE — Progress Notes (Signed)
Virtual Visit via Telephone Note  I connected with Albertine Patricia on 12/27/19 at 11:30 AM EDT by telephone and verified that I am speaking with the correct person using two identifiers.   I discussed the limitations, risks, security and privacy concerns of performing an evaluation and management service by telephone and the availability of in person appointments. I also discussed with the patient that there may be a patient responsible charge related to this service. The patient expressed understanding and agreed to proceed.   Assessment and Plan:  [name] was seen today for cough, fatigue, headache and sore throat.  Diagnoses and all orders for this visit:  Encounter for screening for COVID-19 -     POC COVID-19 BinaxNow -Pos+ Discussed precautions and spread Discussed monitoring oxygen saturation as well as temperature.  Acute recurrent pansinusitis -     azithromycin (ZITHROMAX) 250 MG tablet; Take 2 tablets (500 mg) on  Day 1,  followed by 1 tablet (250 mg) once daily on Days 2 through 5.  Anxiety Doing well on current regiment Continue citalopram 20mg  daily Discussed stress management techniques   Discussed good sleep hygiene Discussed increasing physical activity and exercise Increase water intake  Medication management Continued   Contact office or return with any new or worsening symptoms.  Discussed hospital precautions with patient including but not limited to change in vision, weakness one side of body, chest pain or shortness of breath. Discussed hospital precautions with patient.      Further disposition pending results of labs. Discussed med's effects and SE's.   Over 30 minutes of non face to face interview,  counseling, chart review, and critical decision making was performed.   Future Appointments  Date Time Provider Department Center  07/18/2020  3:00 PM 09/17/2020, NP [facility] None     ------------------------------------------------------------------------------------------------------------------   HPI 24 y.o.female presents for evaluation of sinus symptoms.  She reports that she feels very exhausted.  Her symptoms started six days ago.  She reports she felt bad on Friday but was able to make it through work.  She reports on Sunday she had a high fever and dry cough but it resolved on Monday.  She felt ok when she woke up so she went to work.  She was increasing fatigue and experiencing a headache and sinus pressure.  She is taking cough syrup, that is helping some.   Past Medical History:  Diagnosis Date  . Allergy   . Unspecified vitamin D deficiency      No Known Allergies  Current Outpatient Medications on File Prior to Visit  Medication Sig  . Cetirizine HCl (ZYRTEC ALLERGY) 10 MG CAPS Take 10 mg by mouth daily.  . citalopram (CELEXA) 20 MG tablet Take 1 tablet Daily for Mood & Anxiety  . drospirenone-ethinyl estradiol (SYEDA) 3-0.03 MG tablet Take 1 tablet Daily  . polyethylene glycol powder (GLYCOLAX/MIRALAX) powder DISSOLVE 17 GRAMS IN LIQUID AND DRINK AS DIRECTED BY PRESCRIBER  . SUMAtriptan (IMITREX) 100 MG tablet Take 1 tablet (100 mg total) by mouth once as needed for migraine. May repeat in 2 hours if headache persists or recurs.   No current facility-administered medications on file prior to visit.    ROS: all negative except above.   Physical Exam:  Temp 98.4 F (36.9 C)   General : Well sounding patient in no apparent distress HEENT: no hoarseness, no cough for duration of visit Lungs: speaks in complete sentences, no audible wheezing, no apparent distress Neurological: alert, oriented x 3  Psychiatric: pleasant, judgement appropriate   Elder Negus, NP 12:45 PM [facility]

## 2020-04-09 ENCOUNTER — Other Ambulatory Visit: Payer: Self-pay | Admitting: Internal Medicine

## 2020-04-09 DIAGNOSIS — F419 Anxiety disorder, unspecified: Secondary | ICD-10-CM

## 2020-05-30 ENCOUNTER — Ambulatory Visit (INDEPENDENT_AMBULATORY_CARE_PROVIDER_SITE_OTHER): Payer: BC Managed Care – PPO | Admitting: Adult Health Nurse Practitioner

## 2020-05-30 ENCOUNTER — Other Ambulatory Visit: Payer: Self-pay

## 2020-05-30 ENCOUNTER — Encounter: Payer: Self-pay | Admitting: Adult Health Nurse Practitioner

## 2020-05-30 VITALS — BP 136/80 | HR 101 | Temp 97.1°F | Wt 197.0 lb

## 2020-05-30 DIAGNOSIS — Z1152 Encounter for screening for COVID-19: Secondary | ICD-10-CM | POA: Diagnosis not present

## 2020-05-30 DIAGNOSIS — J0140 Acute pansinusitis, unspecified: Secondary | ICD-10-CM | POA: Diagnosis not present

## 2020-05-30 LAB — POC COVID19 BINAXNOW: SARS Coronavirus 2 Ag: NEGATIVE

## 2020-05-30 MED ORDER — DEXAMETHASONE 0.5 MG PO TABS: ORAL_TABLET | ORAL | 0 refills | Status: DC

## 2020-05-30 MED ORDER — AZITHROMYCIN 250 MG PO TABS: ORAL_TABLET | ORAL | 1 refills | Status: AC

## 2020-05-30 NOTE — Patient Instructions (Addendum)
We have sent in a prescritpion for azithromycin antibiotic, take two today and the one daily until gone. If no improvement start taking the dexamethasone, steroid, as directed.  Be sure take the medication with food.  Use saline nasal spray.  Avoid flonase at this time.   Allergy Symptoms / Runny Nose: Chose one  Zyrtec / Cetirizine Take 10mg  by mouth May cause drowsiness, take nightly Be sure to drink plenty of water If this is not effective, try Xyzal or Allegra  OR  Xyzal / Levocetirazine  Take 5mg  by mouth May cause drowsiness, take nightly Be sure to drink plenty of water If this is not effective try Allegra or Zyrtec    You can try Allegra, this can be taken with one from above.   Allegra / fexofenadine Take 180mg  by mouth daily If this is not effective try Zyrtec or Xyzal   *If you battle with chronic allergies you may need to change the antihistamine you currently use to find most effective.   You may continue taking Singular with one of the above antihistamines.

## 2020-05-30 NOTE — Progress Notes (Signed)
Assessment and Plan: Beth Thomas was seen today for headache, sinus problem and sneeze.  Diagnoses and all orders for this visit:  Encounter for screening for COVID-19 -     POC COVID-19 BinaxNow - Negative  Acute non-recurrent pansinusitis -     azithromycin (ZITHROMAX) 250 MG tablet; Take 2 tablets (500 mg) on  Day 1,  followed by 1 tablet (250 mg) once daily on Days 2 through 5.  -     dexamethasone (DECADRON) 0.5 MG tablet; Take 1 tab 3 x day - 3 days, then 2 x day - 3 days, then 1 tab daily Discontinue flonase for now Continue antihistamine, zyrtec, rotate, xyzal, allegra Use saline nasal spray.  Contact office with any new or worsening symptoms.   Further disposition pending results of labs. Discussed med's effects and SE's.   Over 30 minutes of face to face interview, exam, counseling, chart review, and critical decision making was performed.   Future Appointments  Date Time Provider Department Center  07/18/2020  3:00 PM Elder Negus, NP [facility] None    ------------------------------------------------------------------------------------------------------------------   HPI 25 y.o.female presents for evaluation of sinus symptoms that started approximately three days ago.  She reports right side nasal congestion and left sided headache.  She reports congestions bilaterally now.  She is also having sinus pressure that is worsening.  She is also having a sore throat, constant.  She reports cough drops helps while taking them.  She was taking advil 800mg  every 4 hours for her headache and that helper her throat temporarily.   Denies cough but intermittently with drainage.  Denies any vertigo, tinnitus nausea vomiting, oltagia, constipation diarrhea.   She has been doing flonase daily and zyrtec nightly.    Past Medical History:  Diagnosis Date  . Allergy   . Unspecified vitamin D deficiency      No Known Allergies  Current Outpatient Medications on File Prior to  Visit  Medication Sig  . Cetirizine HCl 10 MG CAPS Take 10 mg by mouth daily.  . citalopram (CELEXA) 20 MG tablet Take  1 tablet  Daily for Mood  . drospirenone-ethinyl estradiol (SYEDA) 3-0.03 MG tablet Take 1 tablet Daily  . polyethylene glycol powder (GLYCOLAX/MIRALAX) powder DISSOLVE 17 GRAMS IN LIQUID AND DRINK AS DIRECTED BY PRESCRIBER  . SUMAtriptan (IMITREX) 100 MG tablet Take 1 tablet (100 mg total) by mouth once as needed for migraine. May repeat in 2 hours if headache persists or recurs.   No current facility-administered medications on file prior to visit.    ROS: all negative except above.   Physical Exam:  BP 136/80   Pulse (!) 101   Temp (!) 97.1 F (36.2 C)   Wt 197 lb (89.4 kg)   SpO2 96%   BMI 33.81 kg/m   General Appearance: Well nourished, in no apparent distress. Eyes: PERRLA, EOMs, conjunctiva no swelling or erythema Sinuses: Frontal/maxillary tenderness, erythema noted left ENT/Mouth: Ext aud canals clear, TMs without erythema, bulging. No erythema, swelling, or exudate on post pharynx.  Tonsils not swollen or erythematous. Hearing normal.  Neck: Supple, thyroid normal.  Respiratory: Respiratory effort normal, BS equal bilaterally without rales, rhonchi, wheezing or stridor.  Cardio: RRR with no MRGs. Brisk peripheral pulses without edema.  Abdomen: Soft, + BS.  Non tender, no guarding, rebound, hernias, masses. Lymphatics: Non tender without lymphadenopathy.  Musculoskeletal: Full ROM, 5/5 strength, normal gait.  Skin: Warm, dry without rashes, lesions, ecchymosis.  Neuro: Cranial nerves intact. Normal muscle tone, no cerebellar  symptoms. Sensation intact.  Psych: Awake and oriented X 3, normal affect, Insight and Judgment appropriate.      Elder Negus, Edrick Oh, DNP [facility] 05/30/2020  4:24 PM

## 2020-06-28 ENCOUNTER — Encounter: Payer: BC Managed Care – PPO | Admitting: Physician Assistant

## 2020-07-18 ENCOUNTER — Encounter: Payer: BC Managed Care – PPO | Admitting: Adult Health Nurse Practitioner

## 2020-07-24 ENCOUNTER — Encounter: Payer: BC Managed Care – PPO | Admitting: Adult Health

## 2020-08-06 ENCOUNTER — Other Ambulatory Visit: Payer: Self-pay | Admitting: Internal Medicine

## 2020-08-06 DIAGNOSIS — F419 Anxiety disorder, unspecified: Secondary | ICD-10-CM

## 2020-08-22 NOTE — Progress Notes (Signed)
[facility] [facility]    Lucky Cowboy, M.D.                                         Judd Gaudier, DNP  [facility]                [address]                [place], [place]. [address] Telephone [phone] Telefax [phone] _________________________________     THIS ENCOUNTER IS A VIRTUAL/TELEPHONE VISIT DUE TO COVID-19 - PATIENT WAS NOT SEEN IN THE OFFICE.  PATIENT HAS CONSENTED TO VIRTUAL VISIT / TELEMEDICINE VISIT  This provider placed a call to [place] using telephone , her appointment was changed to a virtual office visit to reduce the risk of exposure to the COVID-19 virus and to help [name] remain healthy and safe. The virtual visit will also provide continuity of care. She verbalizes understanding.    COVID-19  Covid 19 positive per rapid screening test is (+) Positive  Regular breathing exercises, proning Take tylenol PRN temp 101+ Push hydration Sx supportive therapy Follow up via mychart or telephone if needed Advised patient obtain O2 monitor; present to ED if persistently <90% or with severe dyspnea, any CP, fever uncontrolled by tylenol, confusion, sudden decline Should remain in isolation until at least 10 days from onset of sx, 24-48 hours fever free without tylenol, sx such as cough are improved. Advised may return to [facility] on Monday June 20.   Virtual Visit via telephone Note  I connected with [name] @ on 08/23/2020  by telephone.  I verified that I am speaking with the correct person using two identifiers.    I discussed the limitations of evaluation and management by telemedicine and the availability of in person appointments. The patient expressed understanding and agreed to proceed.  History of Present Illness:      Patient is a very nice 25 yo single WF in general good health who relates a 2-3 day prodrome of sore throat, loss of voice & fatigue. Denies any sinus pressure  /congestion, post nasal drainage, cough.    Medications    drospirenone-ethinyl estradiol (SYEDA) 3-0.03 MG tablet, Take 1 tablet Daily    Cetirizine HCl 10 MG CAPS, Take daily.    IMITREX  100 MG , Take 1 tablet once as needed for migraine. May repeat in 2 hours if needed    citalopram (CELEXA) 20 MG tablet, TAKE 1 TABLET DAILY FOR MOOD    polyethylene glycol powder, DISSOLVE 17 GRAMS IN LIQUID AND DRINK AS DIRECTED BY PRESCRIBER  Problem list  She has Allergic rhinitis; Vitamin D deficiency; Other abnormal glucose; HPV in female; Anxiety; and Mixed hyperlipidemia on their problem list.   Observations/Objective:    Well sounding patient in no apparent distress HEENT: slight hoarseness, no cough for duration of visit Lungs: speaks in complete sentences, no audible wheezing, no apparent distress Neurological: alert, oriented x 3 Psychiatric: pleasant, judgement appropriate  Assessment and Plan:   1. Acute COVID-19  - dexamethasone 4 MG; Take 1 tab 3 x day - 3 days, then 2 x day - 3 days, then 1 tab daily  - Disp 20 tab;  2. Pharyngitis    3. Laryngitis, acute   4. Encounter for screening for COVID-19  - POC COVID-19  5. Postviral fatigue  syndrome    Follow Up Instructions:      I discussed the assessment and treatment plan with the patient. The patient was provided an opportunity to ask questions and all were answered. The patient agreed with the plan and demonstrated an understanding of the instructions.       The patient was advised to call back or seek an in-person evaluation if the symptoms worsen or if the condition fails to improve as anticipated.  I provided 20 minutes of non-face-to-face time during this encounter including time spent in chart review, documentation and sending prescriptions.   Marinus Maw, MD

## 2020-08-23 ENCOUNTER — Ambulatory Visit (INDEPENDENT_AMBULATORY_CARE_PROVIDER_SITE_OTHER): Payer: BC Managed Care – PPO | Admitting: Internal Medicine

## 2020-08-23 ENCOUNTER — Other Ambulatory Visit: Payer: Self-pay

## 2020-08-23 ENCOUNTER — Encounter: Payer: Self-pay | Admitting: Internal Medicine

## 2020-08-23 DIAGNOSIS — J040 Acute laryngitis: Secondary | ICD-10-CM

## 2020-08-23 DIAGNOSIS — J028 Acute pharyngitis due to other specified organisms: Secondary | ICD-10-CM | POA: Diagnosis not present

## 2020-08-23 DIAGNOSIS — Z1152 Encounter for screening for COVID-19: Secondary | ICD-10-CM | POA: Diagnosis not present

## 2020-08-23 DIAGNOSIS — U071 COVID-19: Secondary | ICD-10-CM | POA: Diagnosis not present

## 2020-08-23 DIAGNOSIS — G9331 Postviral fatigue syndrome: Secondary | ICD-10-CM

## 2020-08-23 DIAGNOSIS — G933 Postviral fatigue syndrome: Secondary | ICD-10-CM

## 2020-08-23 MED ORDER — DEXAMETHASONE 4 MG PO TABS: ORAL_TABLET | ORAL | 0 refills | Status: DC

## 2020-08-23 NOTE — Addendum Note (Signed)
Addended by: Lucky Cowboy on: 08/23/2020 02:43 PM   Modules accepted: Level of Service

## 2020-08-26 ENCOUNTER — Other Ambulatory Visit: Payer: Self-pay

## 2020-08-26 LAB — POC COVID19 BINAXNOW: SARS Coronavirus 2 Ag: POSITIVE — AB

## 2020-09-28 NOTE — Progress Notes (Signed)
Complete Physical  Assessment and Plan:  Beth Thomas was seen today for annual exam.  Diagnoses and all orders for this visit:  Encounter for general adult medical examination with abnormal findings -     CBC with Differential/Platelet -     COMPLETE METABOLIC PANEL WITH GFR -     Lipid panel -     TSH -     Hemoglobin A1c -     VITAMIN D 25 Hydroxy (Vit-D Deficiency, Fractures) -     Magnesium -     Urinalysis, Routine w reflex microscopic -     Microalbumin / creatinine urine ratio  Mixed hyperlipidemia -     COMPLETE METABOLIC PANEL WITH GFR -     Lipid panel  Other abnormal glucose -     Hemoglobin A1c  Anxiety  Continue Celxa and monitor symptoms  Vitamin D deficiency -     VITAMIN D 25 Hydroxy (Vit-D Deficiency, Fractures)  Medication management -     CBC with Differential/Platelet -     COMPLETE METABOLIC PANEL WITH GFR -     Lipid panel -     TSH -     Hemoglobin A1c -     VITAMIN D 25 Hydroxy (Vit-D Deficiency, Fractures) -     Magnesium -     Urinalysis, Routine w reflex microscopic -     Microalbumin / creatinine urine ratio  Obesity BMI 34-34.9  Over 10 mintues spent counseling patient on diet and exercise.  Pt craves sweets, recommend L-Glutamine 500 mg three times a day to control cravings.  Abnormal menses -     CBC with Differential/Platelet  Migraine without aura and without status migrainosus, not intractable  No longer using Imitrex, migraines are fewer and controlled with Advil  Allergic rhinitis, unspecified seasonality, unspecified trigger  Continue Claritin daily and Flonase as needed  Screening for thyroid disorder -     TSH  Screening for hematuria or proteinuria -     Urinalysis, Routine w reflex microscopic -     Microalbumin / creatinine urine ratio  Pap smear for cervical cancer screening -     PAP, TP IMAGING, WNL RFLX HPV     Discussed med's effects and SE's. Screening labs and tests as requested with regular follow-up as  recommended. Over 40 minutes of exam, counseling, chart review and critical decision making was performed  HPI  This very nice 25 y.o.female presents for complete physical.   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 Also works part time at [employer].  She has been having less migraines and has not had to use Imitrex controlled with Advil.  She is sexually active, with BCP she has been having very regular menses. LMP 09/10/20  She does workout, she is trying to do 5 days a week.   Finally, patient has history of Vitamin D Deficiency, not on medication consistently.  Lab Results  Component Value Date   VD25OH 27 (L) 07/19/2019  '  BMI is Body mass index is 34.14 kg/m., she is working on diet and exercise. She is currently walking 3-4 miles 3-5 days a week.  Wt Readings from Last 3 Encounters:  09/30/20 195 lb 12.8 oz (88.8 kg)  05/30/20 197 lb (89.4 kg)  07/19/19 187 lb (84.8 kg)   The cholesterol last visit was:   Lab Results  Component Value Date   CHOL 262 (H) 07/19/2019   HDL  52 07/19/2019   LDLCALC 172 (H) 07/19/2019   TRIG 227 (H) 07/19/2019   CHOLHDL 5.0 (H) 07/19/2019   Last A1C in the office was:  Lab Results  Component Value Date   HGBA1C 5.1 07/19/2019      Current Medications:  Current Outpatient Medications on File Prior to Visit  Medication Sig Dispense Refill   Cetirizine HCl 10 MG CAPS Take 10 mg by mouth daily.     citalopram (CELEXA) 20 MG tablet TAKE 1 TABLET BY MOUTH DAILY FOR MOOD 90 tablet 0   drospirenone-ethinyl estradiol (SYEDA) 3-0.03 MG tablet Take 1 tablet Daily 84 tablet 3   ibuprofen (ADVIL) 200 MG tablet Take 200 mg by mouth every 6 (six) hours as needed. Takes 4 tablets prn     polyethylene glycol powder (GLYCOLAX/MIRALAX) powder DISSOLVE 17 GRAMS IN LIQUID AND DRINK AS DIRECTED BY PRESCRIBER 765 g 0   No current facility-administered medications on file prior to  visit.   Health Maintenance:   Immunization History  Administered Date(s) Administered   HPV 9-valent 10/10/2014, 12/12/2014, 05/14/2015   Influenza Inj Mdck Quad With Preservative 11/17/2016   Influenza-Unspecified 11/17/2016   PPD Test 09/12/2018   Tdap 11/15/2006, 06/04/2016   TD/TDAP: 2018 Influenza: Due 12/2020 Pneumovax: N/a Prevnar 13: N/a HPV vaccine  Completed COVID vaccine- did not have  LMP: 09/10/20 Sexually Active: on BCP, she is sexually active declines STD testing Pap: 2020 normal, done today   Medical History:  Past Medical History:  Diagnosis Date   Allergy    Unspecified vitamin D deficiency    Allergies No Known Allergies  SURGICAL HISTORY She  has a past surgical history that includes Tonsilectomy/adenoidectomy with myringotomy (01/2016). FAMILY HISTORY Her family history includes Diabetes in her mother; Hyperlipidemia in her father and mother; Hypertension in her father and mother. SOCIAL HISTORY She  reports that she has never smoked. She has been exposed to tobacco smoke. She has never used smokeless tobacco. She reports that she does not drink alcohol and does not use drugs. 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  Negative for chills, fever, malaise/fatigue and weight loss.  HENT: Negative.  Negative for congestion, ear discharge, hearing loss, sore throat and tinnitus.   Eyes: Negative.  Negative for blurred vision, discharge and redness.  Respiratory: Negative.  Negative for cough, shortness of breath and wheezing.   Cardiovascular: Negative.  Negative for chest pain, palpitations, orthopnea and leg swelling.  Gastrointestinal: Negative.  Negative for abdominal pain, blood in stool, constipation, diarrhea, heartburn, nausea and vomiting.  Genitourinary: Negative.  Negative for  dysuria, frequency and urgency.  Musculoskeletal: Negative.  Negative for back pain, falls, joint pain and myalgias.  Skin:  Negative for itching and rash.  Neurological:  Positive for headaches (rare migraines). Negative for dizziness, tingling, seizures and weakness.  Endo/Heme/Allergies: Negative.  Does not bruise/bleed easily.  Psychiatric/Behavioral: Negative.  Negative for depression, hallucinations, memory loss and suicidal ideas. The patient does not have insomnia.    Physical Exam: Estimated body mass index is 34.14 kg/m as calculated from the following:   Height as of this encounter: 5' 3.5" (1.613 m).   Weight as of this encounter: 195 lb 12.8 oz (88.8 kg). BP 112/78   Pulse 82   Temp (!) 97.5 F (36.4 C)   Ht 5' 3.5" (1.613 m)  Wt 195 lb 12.8 oz (88.8 kg)   SpO2 98%   BMI 34.14 kg/m  General Appearance: Well nourished, in no apparent distress.  Eyes: PERRLA, EOMs, conjunctiva no swelling or erythema, normal fundi and vessels. Right outer eye with erythematous scaly area with bumps.   Sinuses: No Frontal/maxillary tenderness  ENT/Mouth: Ext aud canals clear, normal light reflex with TMs without erythema, bulging. Good dentition. No erythema, swelling, or exudate on post pharynx. Tonsils not swollen or erythematous. Hearing normal.  Neck: Supple, thyroid normal. No bruits  Respiratory: Respiratory effort normal, BS equal bilaterally without rales, rhonchi, wheezing or stridor.  Cardio: RRR without murmurs, rubs or gallops. Brisk peripheral pulses without edema.  Chest: symmetric, with normal excursions and percussion.  Breasts: Symmetric, without lumps, nipple discharge, retractions.  Abdomen: Soft, nontender, no guarding, rebound, hernias, masses, or organomegaly.  Lymphatics: Non tender without lymphadenopathy.  Genitourinary: normal external genitalia, vulva, vagina, cervix, uterus and adnexa, PAP: Pap smear done today. Musculoskeletal: Full ROM all peripheral  extremities,5/5 strength, and normal gait.  Skin: Warm, dry without rashes, lesions, ecchymosis. Neuro: Cranial nerves intact, reflexes equal bilaterally. Normal muscle tone, no cerebellar symptoms. Sensation intact.  Psych: Awake and oriented X 3, normal affect, Insight and Judgment appropriate.   EKG: defer  Medard Decuir W Avenir Lozinski 3:48 PM [facility]

## 2020-09-30 ENCOUNTER — Encounter: Payer: Self-pay | Admitting: Nurse Practitioner

## 2020-09-30 ENCOUNTER — Ambulatory Visit (INDEPENDENT_AMBULATORY_CARE_PROVIDER_SITE_OTHER): Payer: BC Managed Care – PPO | Admitting: Nurse Practitioner

## 2020-09-30 ENCOUNTER — Other Ambulatory Visit: Payer: Self-pay

## 2020-09-30 VITALS — BP 112/78 | HR 82 | Temp 97.5°F | Ht 63.5 in | Wt 195.8 lb

## 2020-09-30 DIAGNOSIS — Z0000 Encounter for general adult medical examination without abnormal findings: Secondary | ICD-10-CM | POA: Diagnosis not present

## 2020-09-30 DIAGNOSIS — E559 Vitamin D deficiency, unspecified: Secondary | ICD-10-CM | POA: Diagnosis not present

## 2020-09-30 DIAGNOSIS — Z79899 Other long term (current) drug therapy: Secondary | ICD-10-CM

## 2020-09-30 DIAGNOSIS — J309 Allergic rhinitis, unspecified: Secondary | ICD-10-CM

## 2020-09-30 DIAGNOSIS — F419 Anxiety disorder, unspecified: Secondary | ICD-10-CM

## 2020-09-30 DIAGNOSIS — Z1389 Encounter for screening for other disorder: Secondary | ICD-10-CM

## 2020-09-30 DIAGNOSIS — Z13220 Encounter for screening for lipoid disorders: Secondary | ICD-10-CM | POA: Diagnosis not present

## 2020-09-30 DIAGNOSIS — R7309 Other abnormal glucose: Secondary | ICD-10-CM

## 2020-09-30 DIAGNOSIS — Z1329 Encounter for screening for other suspected endocrine disorder: Secondary | ICD-10-CM | POA: Diagnosis not present

## 2020-09-30 DIAGNOSIS — G43009 Migraine without aura, not intractable, without status migrainosus: Secondary | ICD-10-CM

## 2020-09-30 DIAGNOSIS — Z124 Encounter for screening for malignant neoplasm of cervix: Secondary | ICD-10-CM

## 2020-09-30 DIAGNOSIS — Z6834 Body mass index (BMI) 34.0-34.9, adult: Secondary | ICD-10-CM

## 2020-09-30 DIAGNOSIS — E782 Mixed hyperlipidemia: Secondary | ICD-10-CM

## 2020-09-30 DIAGNOSIS — Z0001 Encounter for general adult medical examination with abnormal findings: Secondary | ICD-10-CM

## 2020-09-30 DIAGNOSIS — Z131 Encounter for screening for diabetes mellitus: Secondary | ICD-10-CM | POA: Diagnosis not present

## 2020-09-30 DIAGNOSIS — N926 Irregular menstruation, unspecified: Secondary | ICD-10-CM

## 2020-09-30 DIAGNOSIS — E6609 Other obesity due to excess calories: Secondary | ICD-10-CM

## 2020-09-30 NOTE — Patient Instructions (Addendum)
  L-glutamine helps with sweets cravings 500 mg three times a day.    GENERAL HEALTH GOALS   Know what a healthy weight is for you (roughly BMI <25) and aim to maintain this   Aim for 7+ servings of fruits and vegetables daily   70-80+ fluid ounces of water or unsweet tea for healthy kidneys   Limit to max 1 drink of alcohol per day; avoid smoking/tobacco   Limit animal fats in diet for cholesterol and heart health - choose grass fed whenever available   Avoid highly processed foods, and foods high in saturated/trans fats   Aim for low stress - take time to unwind and care for your mental health   Aim for 150 min of moderate intensity exercise weekly for heart health, and weights twice weekly for bone health   Aim for 7-9 hours of sleep daily

## 2020-10-01 LAB — CBC WITH DIFFERENTIAL/PLATELET
Absolute Monocytes: 836 cells/uL (ref 200–950)
Basophils Absolute: 41 cells/uL (ref 0–200)
Basophils Relative: 0.4 %
Eosinophils Absolute: 204 cells/uL (ref 15–500)
Eosinophils Relative: 2 %
HCT: 39.5 % (ref 35.0–45.0)
Hemoglobin: 13.4 g/dL (ref 11.7–15.5)
Lymphs Abs: 3427 cells/uL (ref 850–3900)
MCH: 30.2 pg (ref 27.0–33.0)
MCHC: 33.9 g/dL (ref 32.0–36.0)
MCV: 89 fL (ref 80.0–100.0)
MPV: 11.3 fL (ref 7.5–12.5)
Monocytes Relative: 8.2 %
Neutro Abs: 5692 cells/uL (ref 1500–7800)
Neutrophils Relative %: 55.8 %
Platelets: 315 10*3/uL (ref 140–400)
RBC: 4.44 10*6/uL (ref 3.80–5.10)
RDW: 12.7 % (ref 11.0–15.0)
Total Lymphocyte: 33.6 %
WBC: 10.2 10*3/uL (ref 3.8–10.8)

## 2020-10-01 LAB — URINALYSIS, ROUTINE W REFLEX MICROSCOPIC
Bilirubin Urine: NEGATIVE
Glucose, UA: NEGATIVE
Hyaline Cast: NONE SEEN /LPF
Ketones, ur: NEGATIVE
Nitrite: NEGATIVE
Protein, ur: NEGATIVE
Specific Gravity, Urine: 1.028 (ref 1.001–1.035)
pH: 5.5 (ref 5.0–8.0)

## 2020-10-01 LAB — COMPLETE METABOLIC PANEL WITH GFR
AG Ratio: 1.3 (calc) (ref 1.0–2.5)
ALT: 9 U/L (ref 6–29)
AST: 14 U/L (ref 10–30)
Albumin: 4.2 g/dL (ref 3.6–5.1)
Alkaline phosphatase (APISO): 40 U/L (ref 31–125)
BUN: 12 mg/dL (ref 7–25)
CO2: 24 mmol/L (ref 20–32)
Calcium: 9.5 mg/dL (ref 8.6–10.2)
Chloride: 103 mmol/L (ref 98–110)
Creat: 0.63 mg/dL (ref 0.50–0.96)
Globulin: 3.3 g/dL (calc) (ref 1.9–3.7)
Glucose, Bld: 73 mg/dL (ref 65–99)
Potassium: 4 mmol/L (ref 3.5–5.3)
Sodium: 137 mmol/L (ref 135–146)
Total Bilirubin: 0.3 mg/dL (ref 0.2–1.2)
Total Protein: 7.5 g/dL (ref 6.1–8.1)
eGFR: 125 mL/min/{1.73_m2} (ref >=60)

## 2020-10-01 LAB — LIPID PANEL
Cholesterol: 255 mg/dL — ABNORMAL HIGH (ref <200)
HDL: 57 mg/dL (ref >=50)
LDL Cholesterol (Calc): 156 mg/dL (calc) — ABNORMAL HIGH
Non-HDL Cholesterol (Calc): 198 mg/dL (calc) — ABNORMAL HIGH (ref <130)
Total CHOL/HDL Ratio: 4.5 (calc) (ref <5.0)
Triglycerides: 259 mg/dL — ABNORMAL HIGH (ref <150)

## 2020-10-01 LAB — PAP IG W/ RFLX HPV ASCU

## 2020-10-01 LAB — MICROSCOPIC MESSAGE

## 2020-10-01 LAB — MICROALBUMIN / CREATININE URINE RATIO
Creatinine, Urine: 211 mg/dL (ref 20–275)
Microalb Creat Ratio: 5 mcg/mg creat (ref <30)
Microalb, Ur: 1.1 mg/dL

## 2020-10-01 LAB — MAGNESIUM: Magnesium: 2.3 mg/dL (ref 1.5–2.5)

## 2020-10-01 LAB — PAP, TP IMAGING, WNL RFLX HPV

## 2020-10-01 LAB — HEMOGLOBIN A1C
Hgb A1c MFr Bld: 5.4 % of total Hgb (ref <5.7)
Mean Plasma Glucose: 108 mg/dL
eAG (mmol/L): 6 mmol/L

## 2020-10-01 LAB — TSH: TSH: 1.7 mIU/L

## 2020-10-01 LAB — VITAMIN D 25 HYDROXY (VIT D DEFICIENCY, FRACTURES): Vit D, 25-Hydroxy: 36 ng/mL (ref 30–100)

## 2020-10-06 IMAGING — MR MR LUMBAR SPINE W/O CM
5 series · 47 of 48 positions shown · non-contrast
Comparison: Lumbar radiography 11/24/2017

CLINICAL DATA: Low back pain and right leg pain

EXAM:
MRI LUMBAR SPINE WITHOUT CONTRAST
TECHNIQUE: Multiplanar, multisequence MR imaging of the lumbar spine was
performed. No intravenous contrast was administered.

[Series 3: T2 post-contrast · sagittal · 4.0mm · 0.88mm/px · 5 of 13 slices shown]
[im 1/13]
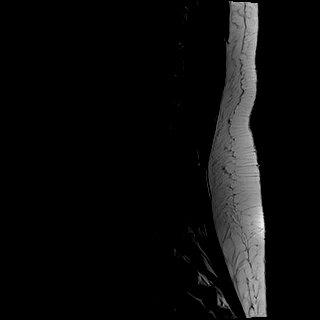
[im 4/13]
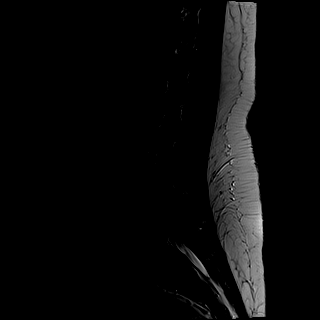
[im 7/13]
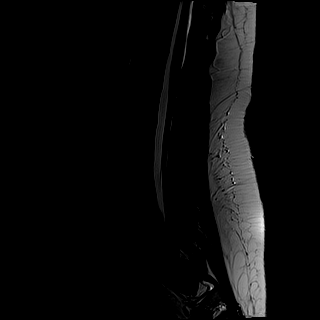
[im 10/13]
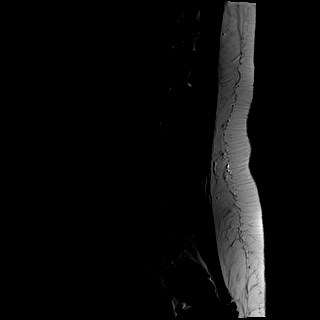
[im 13/13]
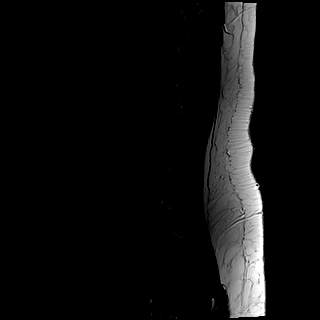

[Series 4: T1 · sagittal · 4.0mm · 0.88mm/px · 5 of 13 slices shown (1 of 2)]
[im 1/13]
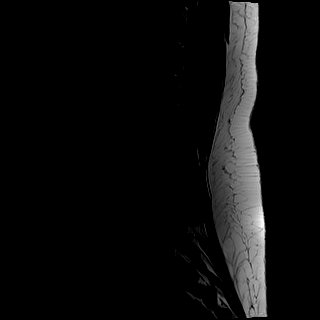
[im 4/13]
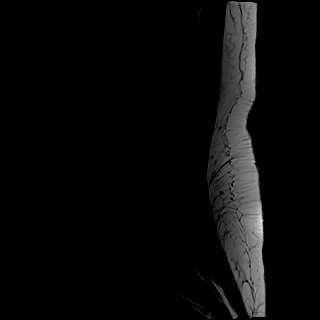
[im 7/13]
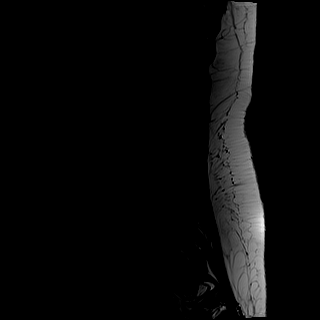
[im 10/13]
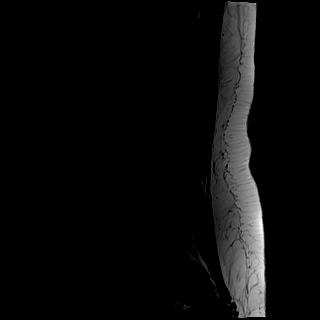
[im 13/13]
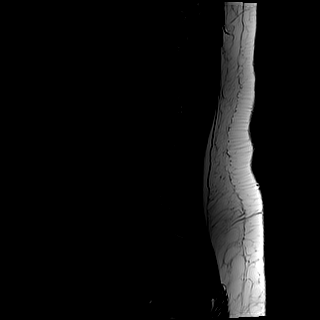

[Series 5: tirm sag · sagittal · 4.0mm · 0.55mm/px · 6 of 13 slices shown]
[im 1/13]
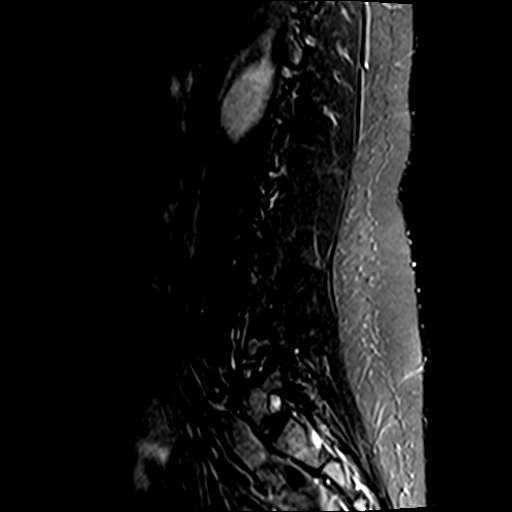
[im 3/13]
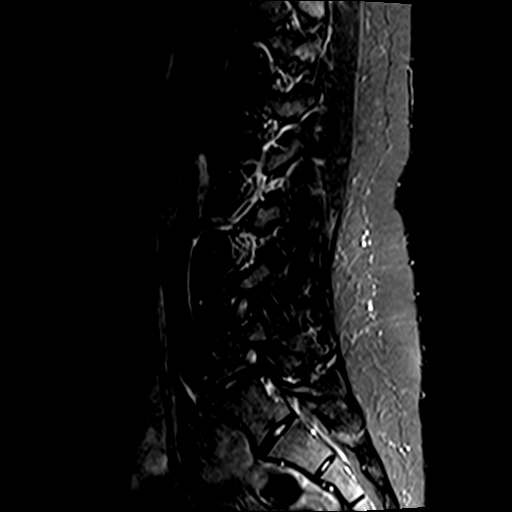
[im 5/13]
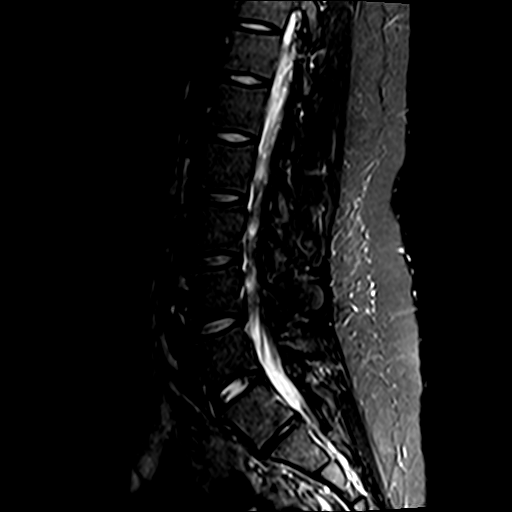
[im 8/13]
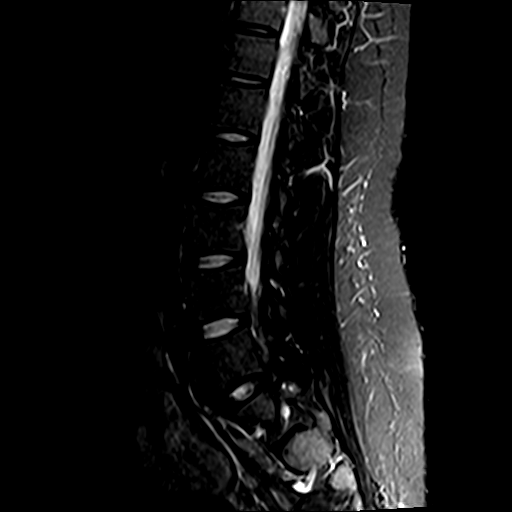
[im 10/13]
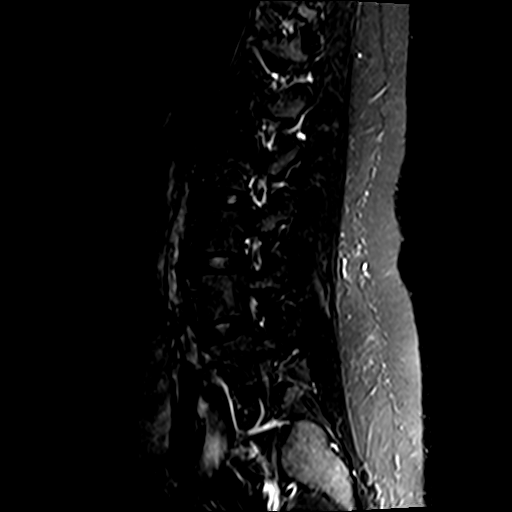
[im 13/13]
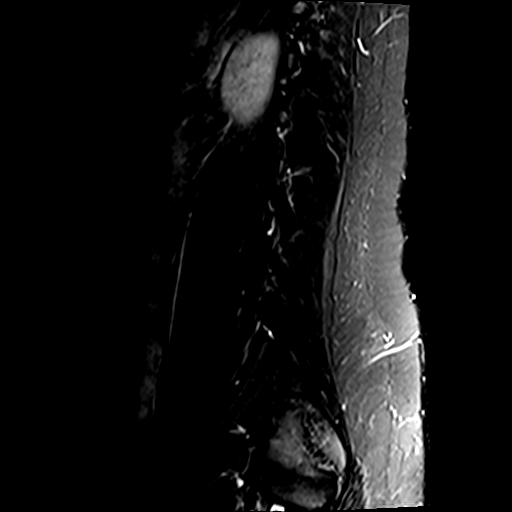

[Series 6: T1 · axial · 4.0mm · 0.78mm/px · z∈[-43,+154]mm · 15 of 37 slices shown (2 of 2)]
[im 1/37]
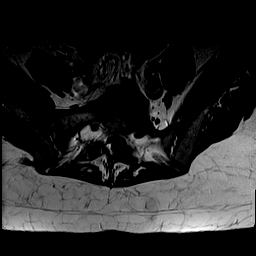
[im 3/37]
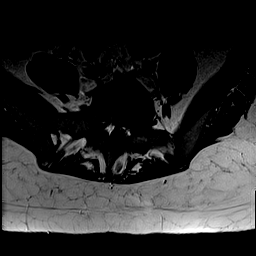
[im 5/37]
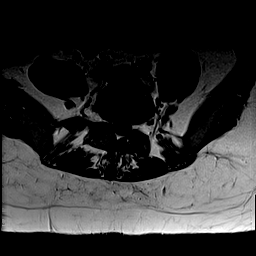
[im 8/37]
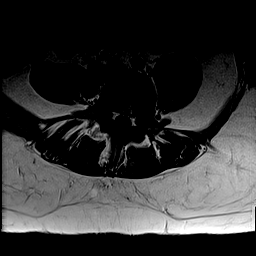
[im 10/37]
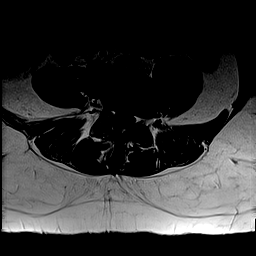
[im 13/37]
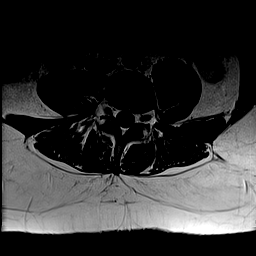
[im 15/37]
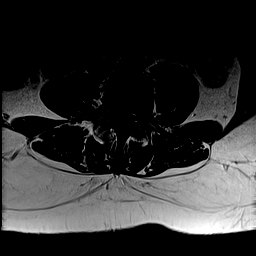
[im 17/37]
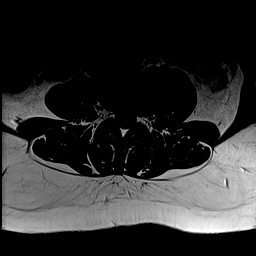
[im 20/37]
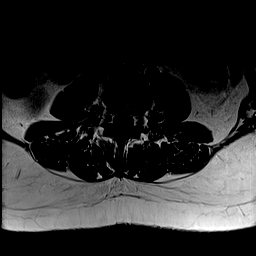
[im 22/37]
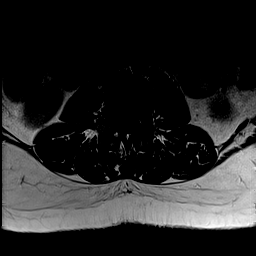
[im 25/37]
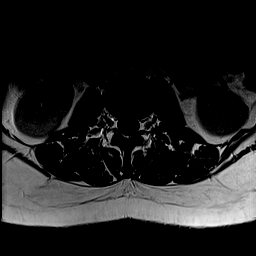
[im 27/37]
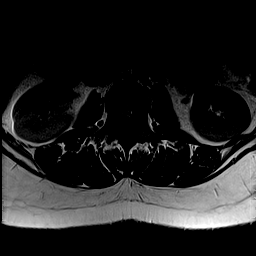
[im 29/37]
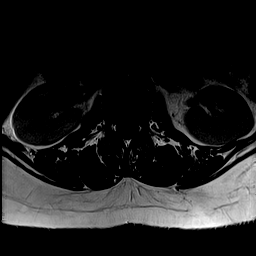
[im 32/37]
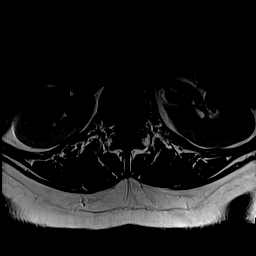
[im 37/37]
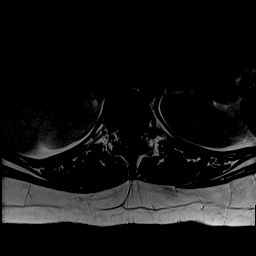

[Series 7: T2 · axial · 4.0mm · 0.78mm/px · z∈[-43,+154]mm · 16 of 37 slices shown]
[im 1/37]
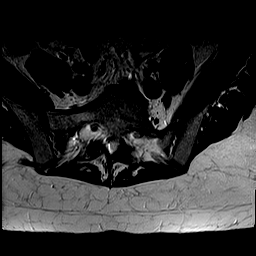
[im 3/37]
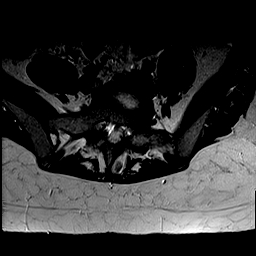
[im 5/37]
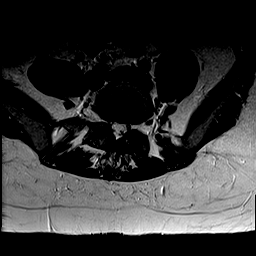
[im 8/37]
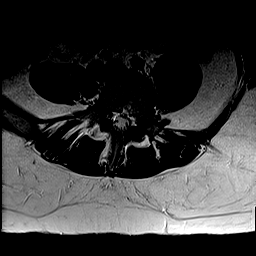
[im 10/37]
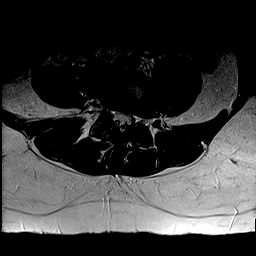
[im 13/37]
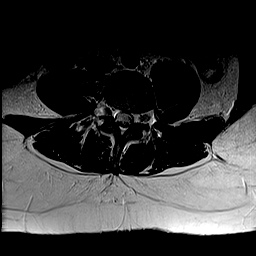
[im 15/37]
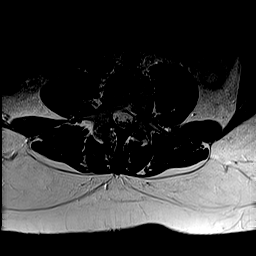
[im 17/37]
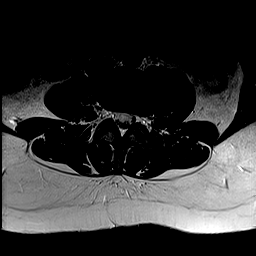
[im 20/37]
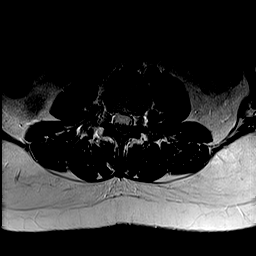
[im 22/37]
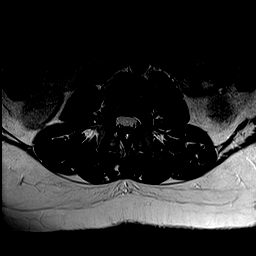
[im 25/37]
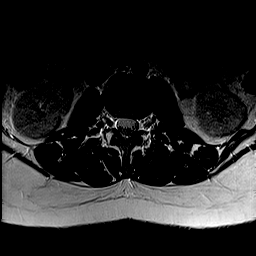
[im 27/37]
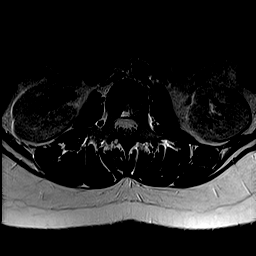
[im 29/37]
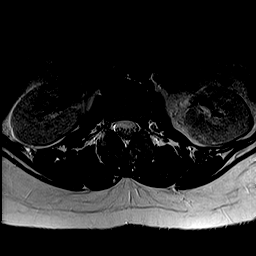
[im 32/37]
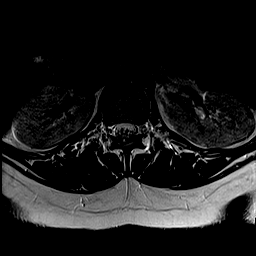
[im 34/37]
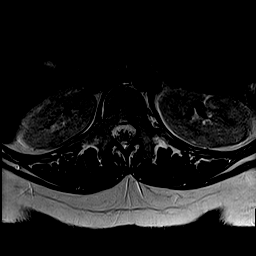
[im 37/37]
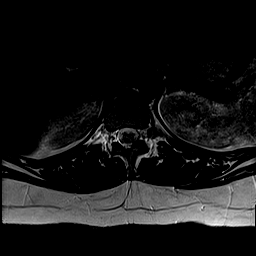

[47 of 48 positions shown; findings below may reference images not displayed]

FINDINGS: Segmentation:  Transitional S1 vertebra based on the lowest ribs

Alignment:  Levoscoliosis

Vertebrae:  No fracture, evidence of discitis, or bone lesion.

Conus medullaris and cauda equina: Conus extends to the L1-2 level.
Conus and cauda equina appear normal.

Paraspinal and other soft tissues: Negative

Disc levels:

Disc height and hydration is well preserved and there is no noted
facet spurring. Tiny right paracentral protrusion is seen on
sagittal images at L5-S1; no associated neural impingement.
IMPRESSION: 1. No impingement or other explanation for right leg symptoms.
2. Levoscoliosis.
3. Transitional S1 vertebra.

## 2020-11-19 ENCOUNTER — Other Ambulatory Visit: Payer: Self-pay

## 2020-11-19 DIAGNOSIS — F419 Anxiety disorder, unspecified: Secondary | ICD-10-CM

## 2020-11-30 ENCOUNTER — Other Ambulatory Visit: Payer: Self-pay | Admitting: Adult Health

## 2020-11-30 DIAGNOSIS — F419 Anxiety disorder, unspecified: Secondary | ICD-10-CM

## 2021-01-21 ENCOUNTER — Other Ambulatory Visit: Payer: Self-pay

## 2021-01-21 ENCOUNTER — Encounter: Payer: Self-pay | Admitting: Adult Health

## 2021-01-21 ENCOUNTER — Ambulatory Visit (INDEPENDENT_AMBULATORY_CARE_PROVIDER_SITE_OTHER): Payer: BC Managed Care – PPO | Admitting: Adult Health

## 2021-01-21 VITALS — BP 112/76 | HR 89 | Temp 97.7°F | Wt 189.0 lb

## 2021-01-21 DIAGNOSIS — Z30011 Encounter for initial prescription of contraceptive pills: Secondary | ICD-10-CM

## 2021-01-21 DIAGNOSIS — J069 Acute upper respiratory infection, unspecified: Secondary | ICD-10-CM | POA: Diagnosis not present

## 2021-01-21 MED ORDER — FLUTICASONE PROPIONATE 50 MCG/ACT NA SUSP: 1.0000 | Freq: Every day | NASAL | 2 refills | Status: DC

## 2021-01-21 MED ORDER — DROSPIRENONE-ETHINYL ESTRADIOL 3-0.03 MG PO TABS: ORAL_TABLET | ORAL | 3 refills | Status: DC

## 2021-01-21 NOTE — Patient Instructions (Signed)

## 2021-01-21 NOTE — Progress Notes (Signed)
Assessment and Plan:  Beth Thomas was seen today for acute visit.  Diagnoses and all orders for this visit:  Viral URI Improving, benign exam Discussed the importance of avoiding unnecessary antibiotic therapy. Suggested symptomatic OTC remedies. Nasal saline spray for congestion, sent in flonase Follow up as needed. Still encouraged flu vaccine in a few weeks once recovered -     fluticasone (FLONASE) 50 MCG/ACT nasal spray; Place 1 spray into both nostrils daily.  Encounter for initial prescription of contraceptive pills -     drospirenone-ethinyl estradiol (SYEDA) 3-0.03 MG tablet; Take 1 tablet Daily   Further disposition pending results of labs. Discussed med's effects and SE's.   Over 15 minutes of exam, counseling, chart review, and critical decision making was performed.   Future Appointments  Date Time Provider Department Center  09/30/2021  3:00 PM Beth Humphrey, NP [facility] None    ------------------------------------------------------------------------------------------------------------------   HPI BP 112/76   Pulse 89   Temp 97.7 F (36.5 C)   Wt 189 lb (85.7 kg)   SpO2 98%   BMI 32.95 kg/m  25 y.o.female presents for evaluation for URI that began 5 days ago. She reports had mild cough, very sore throat first 2 days, did have some chills and body aches. She did check for covid 19 per my recommendations, reports was negative on 01/18/2021.   She reports started theraflu over the weekend with sig improved sx. Currently reports persistent yellow nasal discharge and post nasal drip. 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   Past Medical History:  Diagnosis Date   Allergy    Unspecified vitamin D deficiency      No Known Allergies  Current Outpatient Medications on File Prior to Visit  Medication Sig   Cetirizine HCl 10 MG CAPS Take 10 mg by mouth daily.   citalopram (CELEXA) 20 MG tablet TAKE 1 TABLET BY MOUTH DAILY FOR MOOD    Ferrous Sulfate (IRON PO) Take by mouth daily.   ibuprofen (ADVIL) 200 MG tablet Take 200 mg by mouth every 6 (six) hours as needed. Takes 4 tablets prn   Vitamin D, Cholecalciferol, 25 MCG (1000 UT) TABS Take by mouth.   polyethylene glycol powder (GLYCOLAX/MIRALAX) powder DISSOLVE 17 GRAMS IN LIQUID AND DRINK AS DIRECTED BY PRESCRIBER (Patient not taking: Reported on 01/21/2021)   No current facility-administered medications on file prior to visit.    ROS: all negative except above.   Physical Exam:  BP 112/76   Pulse 89   Temp 97.7 F (36.5 C)   Wt 189 lb (85.7 kg)   SpO2 98%   BMI 32.95 kg/m   General Appearance: Well nourished, in no apparent distress. Eyes: PERRLA, conjunctiva no swelling or erythema ENT/Mouth: mask in place; Hearing normal.  Neck: Supple, thyroid normal.  Respiratory: Respiratory effort normal, BS equal bilaterally without rales, rhonchi, wheezing or stridor.  Cardio: RRR with no MRGs. Brisk peripheral pulses without edema.  Abdomen: Soft, + BS.  Non tender, no guarding, rebound, hernias, masses. Lymphatics: Non tender without lymphadenopathy.  Musculoskeletal: no obvious deformity; normal gait.  Skin: Warm, dry without rashes, lesions, ecchymosis.  Neuro: Normal muscle tone Psych: Awake and oriented X 3, normal affect, Insight and Judgment appropriate.    Dan Maker, NP 10:06 AM [facility]

## 2021-02-11 ENCOUNTER — Other Ambulatory Visit: Payer: Self-pay

## 2021-02-11 ENCOUNTER — Ambulatory Visit
Admission: EM | Admit: 2021-02-11 | Discharge: 2021-02-11 | Disposition: A | Discharge status: Home / Self Care | Payer: BC Managed Care – PPO | Attending: Emergency Medicine | Admitting: Emergency Medicine

## 2021-02-11 DIAGNOSIS — R509 Fever, unspecified: Secondary | ICD-10-CM

## 2021-02-11 DIAGNOSIS — J069 Acute upper respiratory infection, unspecified: Secondary | ICD-10-CM | POA: Diagnosis not present

## 2021-02-11 DIAGNOSIS — J029 Acute pharyngitis, unspecified: Secondary | ICD-10-CM

## 2021-02-11 LAB — POCT INFLUENZA A/B
Influenza A, POC: NEGATIVE
Influenza B, POC: NEGATIVE

## 2021-02-11 NOTE — Discharge Instructions (Addendum)
Your symptoms are most consistent with a viral upper respiratory illness.  Rapid influenza testing today was negative, you were also tested for COVID-19.  The results of your COVID-19 test will be posted to your MyChart.  If any of the results are positive, you will be contacted by phone and further recommendations, if needed, will be provided to you.  Please remain home from work, school, public places until you have been fever free for 24 hours without the use of antifever medications such as Tylenol or ibuprofen.  Conservative care is recommended at this time.  This includes rest, pushing clear fluids and activity as tolerated.  You may also noticed that your appetite is reduced, this is okay as long as they are drinking plenty of clear fluids.  Acetaminophen (Tylenol): This is a good fever reducer.  If there body temperature rises above 101.5 as measured with a thermometer, it is recommended that you give them 1,000 mg every 6-8 hours until they are temperature falls below 101.5, please not take more than 3,000 mg of acetaminophen either as a separate medication or as in ingredient in an over-the-counter cold/flu preparation within a 24-hour period  Ibuprofen  (Advil, Motrin): This is a good anti-inflammatory medication which addresses aches and pains and, to some degree, congestion in the nasal passages.  I recommend giving between 400 to 600 mg every 6-8 hours as needed.  Pseudoephedrine (Sudafed): This is a decongestant.  This medication has to be purchased from the pharmacist counter, I recommend giving 2 tablets, 60 mg, 2-3 times a day as needed to relieve runny nose and sinus drainage.  Guaifenesin (Robitussin, Mucinex): This is an expectorant.  This helps break up chest congestion and loosen up thick nasal drainage making phlegm and drainage more liquid and therefore easier to remove.  I recommend being 400 mg three times daily as needed.  Dextromethorphan (any cough medicine with the  letters "DM" added to it's name such as Robitussin DM): This is a cough suppressant.  This is often recommended to be taken at nighttime to suppress cough and help children sleep.  Give dosage as directed on the bottle.   Chloraseptic Throat Spray: Spray 5 sprays into affected area every 2 hours, hold for 15 seconds and either swallow or spit it out.  This is a excellent numbing medication because it is a spray, you can put it right where you needed and so sucking on a lozenge and numbing your entire mouth.  Based on my physical exam findings and the history provided  today, I do not see any evidence of bacterial infection therefore treatment with antibiotics would be of no benefit.  Please follow-up within the next 3 to 5 days either with your primary care provider or urgent care if your symptoms do not resolve.  If you do not have a primary care provider, we will assist you in finding one.

## 2021-02-11 NOTE — ED Triage Notes (Signed)
Pt presents with c/o cough, headache and report of fever of 102, symptoms began last night

## 2021-02-11 NOTE — ED Provider Notes (Signed)
[facility]    CSN: [id number] Arrival date & time: 02/11/21  1010    HISTORY   Chief Complaint  Patient presents with   Cough   Sore Throat   Headache   HPI Beth Thomas is a 25 y.o. female. Patient complains of acute onset of cough, headache and fever last night, T-max 102.  Patient states cough is dry, nonproductive.  Patient states has not tried any medications at this time.  Patient has a mildly elevated temperature on arrival with otherwise normal vital signs today.  Influenza test today was negative.  The history is provided by the patient.  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 Authorizing Provider  Cetirizine HCl 10 MG CAPS Take 10 mg by mouth daily.    [provider]  citalopram (CELEXA) 20 MG tablet TAKE 1 TABLET BY MOUTH DAILY FOR MOOD 11/30/20   Judd Gaudier, NP  drospirenone-ethinyl estradiol (SYEDA) 3-0.03 MG tablet Take 1 tablet Daily 01/21/21   Judd Gaudier, NP  Ferrous Sulfate (IRON PO) Take by mouth daily.    [provider]  fluticasone (FLONASE) 50 MCG/ACT nasal spray Place 1 spray into both nostrils daily. 01/21/21 01/21/22   Judd Gaudier, NP  ibuprofen (ADVIL) 200 MG tablet Take 200 mg by mouth every 6 (six) hours as needed. Takes 4 tablets prn    [provider]  Vitamin D, Cholecalciferol, 25 MCG (1000 UT) TABS Take by mouth.    [provider]   Family History Family History  Problem Relation Age of Onset   Hyperlipidemia Mother    Hypertension Mother    Diabetes Mother    Hypertension Father    Hyperlipidemia Father    Social History Social History   Tobacco Use   Smoking status: Never    Passive exposure: Yes   Smokeless tobacco: Never  Substance Use Topics   Alcohol use: No    Alcohol/week: 0.0 standard drinks   Drug use: No   Allergies   Patient has no known allergies.  Review of Systems Review of Systems Pertinent findings noted in history of present illness.   Physical Exam Triage Vital Signs ED Triage Vitals  Enc Vitals Group     BP 01/10/21 0827 (!) 147/82     Pulse Rate 01/10/21 0827 72     Resp 01/10/21 0827 18     Temp 01/10/21 0827 98.3 F (36.8 C)  Temp Source 01/10/21 0827 Oral     SpO2 01/10/21 0827 98 %     Weight --      Height --      Head Circumference --      Peak Flow --      Pain Score 01/10/21 0826 5     Pain Loc --      Pain Edu? --      Excl. in GC? --   No data found.  Updated Vital Signs BP 101/67   Pulse 78   Temp 99 F (37.2 C)   Resp 20   LMP 01/21/2021   SpO2 98%   Physical Exam Vitals and nursing note reviewed.  Constitutional:      General: She is not in acute distress.    Appearance: Normal appearance. She is ill-appearing.  HENT:     Head: Normocephalic and atraumatic.     Salivary Glands: Right salivary gland is not diffusely enlarged or tender. Left salivary gland is not diffusely enlarged or tender.     Right Ear: Tympanic membrane, ear canal and external ear normal. No drainage. No middle ear effusion. There is no impacted cerumen. Tympanic membrane is not erythematous or bulging.     Left Ear:  Tympanic membrane, ear canal and external ear normal. No drainage.  No middle ear effusion. There is no impacted cerumen. Tympanic membrane is not erythematous or bulging.     Nose: Mucosal edema, congestion and rhinorrhea present. No nasal deformity or septal deviation. Rhinorrhea is clear.     Right Turbinates: Not enlarged, swollen or pale.     Left Turbinates: Not enlarged, swollen or pale.     Right Sinus: No maxillary sinus tenderness or frontal sinus tenderness.     Left Sinus: No maxillary sinus tenderness or frontal sinus tenderness.     Mouth/Throat:     Lips: Pink. No lesions.     Mouth: Mucous membranes are moist. No oral lesions.     Pharynx: Uvula midline. Pharyngeal swelling, posterior oropharyngeal erythema and uvula swelling present.     Tonsils: No tonsillar exudate. 0 on the right. 0 on the left.  Eyes:     General: Lids are normal.        Right eye: No discharge.        Left eye: No discharge.     Extraocular Movements: Extraocular movements intact.     Conjunctiva/sclera: Conjunctivae normal.     Right eye: Right conjunctiva is not injected.     Left eye: Left conjunctiva is not injected.  Neck:     Trachea: Trachea and phonation normal.  Cardiovascular:     Rate and Rhythm: Normal rate and regular rhythm.     Pulses: Normal pulses.     Heart sounds: Normal heart sounds. No murmur heard.   No friction rub. No gallop.  Pulmonary:     Effort: Pulmonary effort is normal. No accessory muscle usage, prolonged expiration or respiratory distress.     Breath sounds: Normal breath sounds. No stridor, decreased air movement or transmitted upper airway sounds. No decreased breath sounds, wheezing, rhonchi or rales.  Chest:     Chest wall: No tenderness.  Musculoskeletal:        General: Normal range of motion.     Cervical back: Normal range of motion and neck supple. Normal range of motion.  Lymphadenopathy:     Cervical: Cervical adenopathy present.     Right cervical:  Superficial cervical adenopathy and  posterior cervical adenopathy present.     Left cervical: Superficial cervical adenopathy and posterior cervical adenopathy present.  Skin:    General: Skin is warm.     Findings: No erythema or rash.  Neurological:     General: No focal deficit present.     Mental Status: She is alert and oriented to person, place, and time.  Psychiatric:        Mood and Affect: Mood normal.        Behavior: Behavior normal.    Visual Acuity Right Eye Distance:   Left Eye Distance:   Bilateral Distance:    Right Eye Near:   Left Eye Near:    Bilateral Near:     UC Couse / Diagnostics / Procedures:    EKG  Radiology No results found.  Procedures Procedures (including critical care time)  UC Diagnoses / Final Clinical Impressions(s)   I have reviewed the triage vital signs and the nursing notes.  Pertinent labs & imaging results that were available during my care of the patient were reviewed by me and considered in my medical decision making (see chart for details).   Final diagnoses:  Fever, unspecified  Acute pharyngitis, unspecified etiology  Viral upper respiratory illness   Viral upper respiratory illness, conservative care recommended, influenza test today is negative, COVID-19 test result is pending.  Disposition Upon Discharge:  Condition: stable for discharge home Home: take medications as prescribed; routine discharge instructions as discussed; follow up as advised.  Patient presented with an acute illness with associated systemic symptoms and significant discomfort requiring urgent management. In my opinion, this is a condition that a prudent lay person (someone who possesses an average knowledge of health and medicine) may potentially expect to result in complications if not addressed urgently such as respiratory distress, impairment of bodily function or dysfunction of bodily organs.   Routine symptom specific, illness specific and/or  disease specific instructions were discussed with the patient and/or caregiver at length.   As such, the patient has been evaluated and assessed, work-up was performed and treatment was provided in alignment with urgent care protocols and evidence based medicine.  Patient/parent/caregiver has been advised that the patient may require follow up for further testing and treatment if the symptoms continue in spite of treatment, as clinically indicated and appropriate.  The patient was tested for COVID-19, Influenza and/or RSV, then the patient/parent/guardian was advised to isolate at home pending the results of his/her diagnostic coronavirus test and potentially longer if they're positive. I have also advised pt that if his/her COVID-19 test returns positive, it's recommended to self-isolate for at least 10 days after symptoms first appeared AND until fever-free for 24 hours without fever reducer AND other symptoms have improved or resolved. Discussed self-isolation recommendations as well as instructions for household member/close contacts as per the [facility] and [place] [facility], and also gave patient the COVID packet with this information.  Patient/parent/caregiver has been advised to return to the [facility] or PCP in 3-5 days if no better; to PCP or the Emergency Department if new signs and symptoms develop, or if the current signs or symptoms continue to change or worsen for further workup, evaluation and treatment as clinically indicated and appropriate  The patient will follow up with their current PCP if and as advised. If the patient does not currently have a PCP we will assist them in obtaining one.   The patient may need specialty follow up if the symptoms continue, in spite of conservative  treatment and management, for further workup, evaluation, consultation and treatment as clinically indicated and appropriate.  Patient/parent/caregiver verbalized understanding and agreement of plan as discussed.  All questions  were addressed during visit.  Please see discharge instructions below for further details of plan.  ED Prescriptions   None    PDMP not reviewed this encounter.  Pending results:  Labs Reviewed  NOVEL CORONAVIRUS, NAA  POCT INFLUENZA A/B    Medications Ordered in UC: Medications - No data to display  Discharge Instructions:   Discharge Instructions      Your symptoms are most consistent with a viral upper respiratory illness.  Rapid influenza testing today was negative, you were also tested for COVID-19.  The results of your COVID-19 test will be posted to your MyChart.  If any of the results are positive, you will be contacted by phone and further recommendations, if needed, will be provided to you.  Please remain home from work, school, public places until you have been fever free for 24 hours without the use of antifever medications such as Tylenol or ibuprofen.  Conservative care is recommended at this time.  This includes rest, pushing clear fluids and activity as tolerated.  You may also noticed that your appetite is reduced, this is okay as long as they are drinking plenty of clear fluids.  Acetaminophen (Tylenol): This is a good fever reducer.  If there body temperature rises above 101.5 as measured with a thermometer, it is recommended that you give them 1,000 mg every 6-8 hours until they are temperature falls below 101.5, please not take more than 3,000 mg of acetaminophen either as a separate medication or as in ingredient in an over-the-counter cold/flu preparation within a 24-hour period  Ibuprofen  (Advil, Motrin): This is a good anti-inflammatory medication which addresses aches and pains and, to some degree, congestion in the nasal passages.  I recommend giving between 400 to 600 mg every 6-8 hours as needed.  Pseudoephedrine (Sudafed): This is a decongestant.  This medication has to be purchased from the pharmacist counter, I recommend giving 2 tablets, 60 mg, 2-3  times a day as needed to relieve runny nose and sinus drainage.  Guaifenesin (Robitussin, Mucinex): This is an expectorant.  This helps break up chest congestion and loosen up thick nasal drainage making phlegm and drainage more liquid and therefore easier to remove.  I recommend being 400 mg three times daily as needed.  Dextromethorphan (any cough medicine with the letters "DM" added to it's name such as Robitussin DM): This is a cough suppressant.  This is often recommended to be taken at nighttime to suppress cough and help children sleep.  Give dosage as directed on the bottle.   Chloraseptic Throat Spray: Spray 5 sprays into affected area every 2 hours, hold for 15 seconds and either swallow or spit it out.  This is a excellent numbing medication because it is a spray, you can put it right where you needed and so sucking on a lozenge and numbing your entire mouth.  Based on my physical exam findings and the history provided  today, I do not see any evidence of bacterial infection therefore treatment with antibiotics would be of no benefit.  Please follow-up within the next 3 to 5 days either with your primary care provider or urgent care if your symptoms do not resolve.  If you do not have a primary care provider, we will assist you in finding one.  Theadora Rama Scales, PA-C 02/11/21 1331

## 2021-02-12 LAB — NOVEL CORONAVIRUS, NAA: SARS-CoV-2, NAA: NOT DETECTED

## 2021-02-12 LAB — SARS-COV-2, NAA 2 DAY TAT

## 2021-02-13 ENCOUNTER — Other Ambulatory Visit: Payer: Self-pay

## 2021-02-13 ENCOUNTER — Ambulatory Visit (INDEPENDENT_AMBULATORY_CARE_PROVIDER_SITE_OTHER): Payer: BC Managed Care – PPO

## 2021-02-13 ENCOUNTER — Encounter: Payer: Self-pay | Admitting: Adult Health

## 2021-02-13 VITALS — Temp 97.9°F

## 2021-02-13 DIAGNOSIS — J029 Acute pharyngitis, unspecified: Secondary | ICD-10-CM

## 2021-02-13 LAB — POCT RAPID STREP A (OFFICE): Rapid Strep A Screen: NEGATIVE

## 2021-02-13 NOTE — Progress Notes (Signed)
Patient presents today for a nurse visit for a rapid strep test per Morrie Sheldon. Strep test was negative.

## 2021-02-19 ENCOUNTER — Encounter: Payer: Self-pay | Admitting: Adult Health

## 2021-02-19 ENCOUNTER — Ambulatory Visit (INDEPENDENT_AMBULATORY_CARE_PROVIDER_SITE_OTHER): Payer: BC Managed Care – PPO | Admitting: Adult Health

## 2021-02-19 ENCOUNTER — Other Ambulatory Visit: Payer: Self-pay

## 2021-02-19 VITALS — BP 118/80 | HR 81 | Temp 97.5°F | Wt 187.0 lb

## 2021-02-19 DIAGNOSIS — H6501 Acute serous otitis media, right ear: Secondary | ICD-10-CM | POA: Diagnosis not present

## 2021-02-19 MED ORDER — PREDNISONE 20 MG PO TABS: ORAL_TABLET | ORAL | 0 refills | Status: DC

## 2021-02-19 NOTE — Progress Notes (Signed)
Assessment and Plan:  Beth Thomas was seen today for acute visit.  Diagnoses and all orders for this visit:  Non-recurrent acute serous otitis media of right ear -Allergy pill, flonase, autoinflation, explained no need for ABX at this time.  - Did offer steroid taper - if not better 2-4 weeks will refer to ENT -     predniSONE (DELTASONE) 20 MG tablet; 2 tablets daily for 3 days, 1 tablet daily for 4 days  Further disposition pending results of labs. Discussed med's effects and SE's.   Over 15 minutes of exam, counseling, chart review, and critical decision making was performed.   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   She reports URI sx began 10 days ago with cough, chills, fever, vomiting, body aches; saw UC - neg flu, covid, 02/13/2021 - neg rapid strep. She reports sx improved about 5 days into course, but residual sinus congestion (no tenderness), mild sinus HA (tylenol/ibuprofen), but is concerned about presssure/popping in her R ear without pain, discharge, changes in hearing. Mild tinnitis.   Taking mucinex and zyrtec and flonase - does year round for allergies.   Past Medical History:  Diagnosis Date   Allergy    Unspecified vitamin D deficiency      No Known Allergies  Current Outpatient Medications on File Prior to Visit  Medication Sig   Cetirizine HCl 10 MG CAPS Take 10 mg by mouth daily.   citalopram (CELEXA) 20 MG tablet TAKE 1 TABLET BY MOUTH DAILY FOR MOOD   drospirenone-ethinyl estradiol (SYEDA) 3-0.03 MG tablet Take 1 tablet Daily   Ferrous Sulfate (IRON PO) Take by mouth daily.   fluticasone (FLONASE) 50 MCG/ACT nasal spray Place 1 spray into  both nostrils daily.   ibuprofen (ADVIL) 200 MG tablet Take 200 mg by mouth every 6 (six) hours as needed. Takes 4 tablets prn   Vitamin D, Cholecalciferol, 25 MCG (1000 UT) TABS Take by mouth.   No current facility-administered medications on file prior to visit.    ROS: all negative except above.   Physical Exam:  LMP 01/21/2021   General Appearance: Well nourished, in no apparent distress. Eyes: PERRLA, EOMs, conjunctiva no swelling or erythema Sinuses: No Frontal/maxillary tenderness ENT/Mouth: Ext aud canals clear, TMs without erythema, bulging. R with mild effusion. No erythema, swelling, or exudate on post pharynx.  Tonsils not swollen or erythematous. Hearing normal.  Neck: Supple, thyroid normal.  Respiratory: Respiratory effort normal, BS equal bilaterally without rales, rhonchi, wheezing or stridor.  Cardio: RRR with no MRGs. Brisk peripheral pulses without edema.  Lymphatics: Non tender without lymphadenopathy.  Musculoskeletal: normal gait.  Skin: Warm, dry without rashes, lesions, ecchymosis.  Neuro: Cranial nerves intact. Normal muscle tone Psych: Awake and oriented X 3, normal affect, Insight and Judgment appropriate.     Dan Maker, NP 1:53 PM [facility]

## 2021-04-08 NOTE — Progress Notes (Deleted)
Assessment and Plan:  There are no diagnoses linked to this encounter.    Further disposition pending results of labs. Discussed med's effects and SE's.   Over 30 minutes of exam, counseling, chart review, and critical decision making was performed.   Future Appointments  Date Time Provider Department Center  04/09/2021  3:30 PM Revonda Humphrey, NP [facility] None  09/30/2021  3:00 PM Revonda Humphrey, NP [facility] None    ------------------------------------------------------------------------------------------------------------------   HPI There were no vitals taken for this visit. 26 y.o.female presents for  Past Medical History:  Diagnosis Date   Allergy    Unspecified vitamin D deficiency      No Known Allergies  Current Outpatient Medications on File Prior to Visit  Medication Sig   Cetirizine HCl 10 MG CAPS Take 10 mg by mouth daily.   citalopram (CELEXA) 20 MG tablet TAKE 1 TABLET BY MOUTH DAILY FOR MOOD   drospirenone-ethinyl estradiol (SYEDA) 3-0.03 MG tablet Take 1 tablet Daily   Ferrous Sulfate (IRON PO) Take by mouth daily.   fluticasone (FLONASE) 50 MCG/ACT nasal spray Place 1 spray into both nostrils daily.   ibuprofen (ADVIL) 200 MG tablet Take 200 mg by mouth every 6 (six) hours as needed. Takes 4 tablets prn   predniSONE (DELTASONE) 20 MG tablet 2 tablets daily for 3 days, 1 tablet daily for 4 days.   Vitamin D, Cholecalciferol, 25 MCG (1000 UT) TABS Take by mouth.   No current facility-administered medications on file prior to visit.    ROS: all negative except above.   Physical Exam:  There were no vitals taken for this visit.  General Appearance: Well nourished, in no apparent distress. Eyes: PERRLA, EOMs, conjunctiva no swelling or erythema Sinuses: No Frontal/maxillary tenderness ENT/Mouth: Ext aud canals clear, TMs without erythema, bulging. No erythema, swelling, or exudate on post pharynx.  Tonsils not swollen or erythematous. Hearing normal.   Neck: Supple, thyroid normal.  Respiratory: Respiratory effort normal, BS equal bilaterally without rales, rhonchi, wheezing or stridor.  Cardio: RRR with no MRGs. Brisk peripheral pulses without edema.  Abdomen: Soft, + BS.  Non tender, no guarding, rebound, hernias, masses. Lymphatics: Non tender without lymphadenopathy.  Musculoskeletal: Full ROM, 5/5 strength, normal gait.  Skin: Warm, dry without rashes, lesions, ecchymosis.  Neuro: Cranial nerves intact. Normal muscle tone, no cerebellar symptoms. Sensation intact.  Psych: Awake and oriented X 3, normal affect, Insight and Judgment appropriate.     Revonda Humphrey, NP 1:57 PM [facility]

## 2021-04-09 ENCOUNTER — Ambulatory Visit: Payer: BC Managed Care – PPO | Admitting: Nurse Practitioner

## 2021-04-09 NOTE — Progress Notes (Deleted)
Assessment and Plan:  There are no diagnoses linked to this encounter.    Further disposition pending results of labs. Discussed med's effects and SE's.   Over 30 minutes of exam, counseling, chart review, and critical decision making was performed.   Future Appointments  Date Time Provider Department Center  04/10/2021  4:00 PM Revonda Humphrey, NP [facility] None  09/30/2021  3:00 PM Revonda Humphrey, NP [facility] None    ------------------------------------------------------------------------------------------------------------------   HPI There were no vitals taken for this visit. 26 y.o.female presents for  Past Medical History:  Diagnosis Date   Allergy    Unspecified vitamin D deficiency      No Known Allergies  Current Outpatient Medications on File Prior to Visit  Medication Sig   Cetirizine HCl 10 MG CAPS Take 10 mg by mouth daily.   citalopram (CELEXA) 20 MG tablet TAKE 1 TABLET BY MOUTH DAILY FOR MOOD   drospirenone-ethinyl estradiol (SYEDA) 3-0.03 MG tablet Take 1 tablet Daily   Ferrous Sulfate (IRON PO) Take by mouth daily.   fluticasone (FLONASE) 50 MCG/ACT nasal spray Place 1 spray into both nostrils daily.   ibuprofen (ADVIL) 200 MG tablet Take 200 mg by mouth every 6 (six) hours as needed. Takes 4 tablets prn   predniSONE (DELTASONE) 20 MG tablet 2 tablets daily for 3 days, 1 tablet daily for 4 days.   Vitamin D, Cholecalciferol, 25 MCG (1000 UT) TABS Take by mouth.   No current facility-administered medications on file prior to visit.    ROS: all negative except above.   Physical Exam:  There were no vitals taken for this visit.  General Appearance: Well nourished, in no apparent distress. Eyes: PERRLA, EOMs, conjunctiva no swelling or erythema Sinuses: No Frontal/maxillary tenderness ENT/Mouth: Ext aud canals clear, TMs without erythema, bulging. No erythema, swelling, or exudate on post pharynx.  Tonsils not swollen or erythematous. Hearing normal.   Neck: Supple, thyroid normal.  Respiratory: Respiratory effort normal, BS equal bilaterally without rales, rhonchi, wheezing or stridor.  Cardio: RRR with no MRGs. Brisk peripheral pulses without edema.  Abdomen: Soft, + BS.  Non tender, no guarding, rebound, hernias, masses. Lymphatics: Non tender without lymphadenopathy.  Musculoskeletal: Full ROM, 5/5 strength, normal gait.  Skin: Warm, dry without rashes, lesions, ecchymosis.  Neuro: Cranial nerves intact. Normal muscle tone, no cerebellar symptoms. Sensation intact.  Psych: Awake and oriented X 3, normal affect, Insight and Judgment appropriate.     Revonda Humphrey, NP 2:28 PM [facility]

## 2021-04-10 ENCOUNTER — Ambulatory Visit: Payer: BC Managed Care – PPO | Admitting: Nurse Practitioner

## 2021-04-14 NOTE — Progress Notes (Signed)
Assessment and Plan:  Beth Thomas was seen today for acute visit.  Diagnoses and all orders for this visit:  Class 1 obesity due to excess calories without serious comorbidity with body mass index (BMI) of 32.0 to 32.9 in adult Long discussion about weight loss, diet, and exercise Recommended diet heavy in fruits and veggies and low in animal meats, cheeses, and dairy products, appropriate calorie intake Patient will work on increasing protein and decrease processed /simple carbs, increase exercise Continue Optavia Follow up at next visit  Given sample of Wegovy to start x 1 month and then begin [place] which insurance coverage shows is on formulary -     Liraglutide -Weight Management (SAXENDA) 18 MG/3ML SOPN; Inject 3 mg into the skin daily. Inject one pen SQ daily for weight loss -     Insulin Pen Needle (NOVOFINE PLUS PEN NEEDLE) 32G X 4 MM MISC; Use new needle daily on prefilled pen syringe       Further disposition pending results of labs. Discussed med's effects and SE's.   Over 30 minutes of exam, counseling, chart review, and critical decision making was performed.   Future Appointments  Date Time Provider [place]  09/30/2021  3:00 PM [name], NP [facility] None    ------------------------------------------------------------------------------------------------------------------   HPI BP 110/80    Pulse 91    Temp 97.7 F (36.5 C)    Ht 5\' 3"  (1.6 m)    Wt 185 lb 12.8 oz (84.3 kg)    LMP 03/14/2021    SpO2 95%    BMI 32.91 kg/m  25 y.o.female presents for discussion about weight loss.  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 She also works at [employer] on the weekends. 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   BMI is Body mass index is 32.91 kg/m., she has not been working on diet and exercise. She is currently on Optavia and started it 4 months ago. dShe has been making food  changes, limiting carbohydrates. She is currently drinking at least 64 ounces a day with an occasional coffee.She is eating more fish and shrimp. Wt Readings from Last 3 Encounters:  04/15/21 185 lb 12.8 oz (84.3 kg)  02/19/21 187 lb (84.8 kg)  01/21/21 189 lb (85.7 kg)    Past Medical History:  Diagnosis Date   Allergy    Unspecified vitamin D deficiency      No Known Allergies  Current Outpatient Medications on File Prior to Visit  Medication Sig   Cetirizine HCl 10 MG CAPS Take 10 mg by mouth daily.   citalopram (CELEXA) 20 MG tablet TAKE 1 TABLET BY MOUTH DAILY FOR MOOD   drospirenone-ethinyl estradiol (SYEDA) 3-0.03 MG tablet Take 1 tablet Daily   Ferrous Sulfate (IRON PO) Take by mouth daily.   fluticasone (FLONASE) 50 MCG/ACT nasal spray Place 1 spray into both nostrils daily.   ibuprofen (ADVIL) 200 MG tablet Take 200 mg by mouth every 6 (six) hours as needed. Takes 4 tablets prn   Vitamin D, Cholecalciferol, 25 MCG (1000 UT) TABS Take by mouth.   predniSONE (DELTASONE) 20 MG tablet 2 tablets daily for 3 days, 1 tablet daily for 4 days. (Patient not taking: Reported on 04/15/2021)   No current facility-administered medications on file prior to visit.    ROS: all negative except above.   Physical Exam:  BP 110/80    Pulse 91  Temp 97.7 F (36.5 C)    Ht 5\' 3"  (1.6 m)    Wt 185 lb 12.8 oz (84.3 kg)    LMP 03/14/2021    SpO2 95%    BMI 32.91 kg/m   General Appearance: Well nourished, in no apparent distress. Eyes: PERRLA, EOMs, conjunctiva no swelling or erythema Sinuses: No Frontal/maxillary tenderness ENT/Mouth: Ext aud canals clear, TMs without erythema, bulging. No erythema, swelling, or exudate on post pharynx.  Tonsils not swollen or erythematous. Hearing normal.  Neck: Supple, thyroid normal.  Respiratory: Respiratory effort normal, BS equal bilaterally without rales, rhonchi, wheezing or stridor.  Cardio: RRR with no MRGs. Brisk peripheral pulses without  edema.  Abdomen: Soft, + BS.  Non tender, no guarding, rebound, hernias, masses. Lymphatics: Non tender without lymphadenopathy.  Musculoskeletal: Full ROM, 5/5 strength, normal gait.  Skin: Warm, dry without rashes, lesions, ecchymosis.  Neuro: Cranial nerves intact. Normal muscle tone, no cerebellar symptoms. Sensation intact.  Psych: Awake and oriented X 3, normal affect, Insight and Judgment appropriate.     [name], NP 3:46 PM [facility]

## 2021-04-15 ENCOUNTER — Encounter: Payer: Self-pay | Admitting: Nurse Practitioner

## 2021-04-15 ENCOUNTER — Ambulatory Visit (INDEPENDENT_AMBULATORY_CARE_PROVIDER_SITE_OTHER): Payer: BC Managed Care – PPO | Admitting: Nurse Practitioner

## 2021-04-15 ENCOUNTER — Other Ambulatory Visit: Payer: Self-pay

## 2021-04-15 VITALS — BP 110/80 | HR 91 | Temp 97.7°F | Ht 63.0 in | Wt 185.8 lb

## 2021-04-15 DIAGNOSIS — E6609 Other obesity due to excess calories: Secondary | ICD-10-CM

## 2021-04-15 DIAGNOSIS — Z6832 Body mass index (BMI) 32.0-32.9, adult: Secondary | ICD-10-CM

## 2021-04-15 MED ORDER — SAXENDA 18 MG/3ML ~~LOC~~ SOPN: 3.0000 mg | PEN_INJECTOR | Freq: Every day | SUBCUTANEOUS | 3 refills | Status: DC

## 2021-04-15 MED ORDER — NOVOFINE PLUS PEN NEEDLE 32G X 4 MM MISC: 0 refills | Status: DC

## 2021-04-15 NOTE — Patient Instructions (Signed)
Liraglutide Injection (Weight Management) °What is this medication? °LIRAGLUTIDE (LIR a GLOO tide) promotes weight loss. It may also be used to maintain weight loss. It works by decreasing appetite. Changes to diet and exercise are often combined with this medication. °This medicine may be used for other purposes; ask your health care provider or pharmacist if you have questions. °COMMON BRAND NAME(S): Saxenda °What should I tell my care team before I take this medication? °They need to know if you have any of these conditions: °Endocrine tumors (MEN 2) or if someone in your family had these tumors °Gallbladder disease °High cholesterol °History of alcohol abuse problem °History of pancreatitis °Kidney disease or if you are on dialysis °Liver disease °Previous swelling of the tongue, face, or lips with difficulty breathing, difficulty swallowing, hoarseness, or tightening of the throat °Stomach problems °Suicidal thoughts, plans, or attempt; a previous suicide attempt by you or a family member °Thyroid cancer or if someone in your family had thyroid cancer °An unusual or allergic reaction to liraglutide, other medications, foods, dyes, or preservatives °Pregnant or trying to get pregnant °Breast-feeding °How should I use this medication? °This medication is for injection under the skin of your upper leg, stomach area, or upper arm. You will be taught how to prepare and give this medication. Use exactly as directed. Take your medication at regular intervals. Do not take it more often than directed. °This medication comes with INSTRUCTIONS FOR USE. Ask your pharmacist for directions on how to use this medication. Read the information carefully. Talk to your pharmacist or care team if you have questions. °It is important that you put your used needles and syringes in a special sharps container. Do not put them in a trash can. If you do not have a sharps container, call your pharmacist or care team to get one. °A  special MedGuide will be given to you by the pharmacist with each prescription and refill. Be sure to read this information carefully each time. °Talk to your care team about the use of this medication in children. While it may be prescribed for children as young as 12 years of age for selected conditions, precautions do apply. °Overdosage: If you think you have taken too much of this medicine contact a poison control center or emergency room at once. °NOTE: This medicine is only for you. Do not share this medicine with others. °What if I miss a dose? °If you miss a dose, take it as soon as you can. If it is almost time for your next dose, take only that dose. Do not take double or extra doses. If you miss your dose for 3 days or more, call your care team to talk about how to restart this medicine. °What may interact with this medication? °Insulin and other medications for diabetes °This list may not describe all possible interactions. Give your health care provider a list of all the medicines, herbs, non-prescription drugs, or dietary supplements you use. Also tell them if you smoke, drink alcohol, or use illegal drugs. Some items may interact with your medicine. °What should I watch for while using this medication? °Visit your care team for regular checks on your progress. °Drink plenty of fluids while taking this medication. Check with your care team if you get an attack of severe diarrhea, nausea, and vomiting. The loss of too much body fluid can make it dangerous for you to take this medication. °This medication may affect blood sugar levels. Ask your care team if   changes in diet or medications are needed if you have diabetes. °Patients and their families should watch out for worsening depression or thoughts of suicide. Also watch out for sudden changes in feelings such as feeling anxious, agitated, panicky, irritable, hostile, aggressive, impulsive, severely restless, overly excited and hyperactive, or not  being able to sleep. If this happens, especially at the beginning of treatment or after a change in dose, call your care team. °Women should inform their care team if they wish to become pregnant or think they might be pregnant. Losing weight while pregnant is not advised and may cause harm to the unborn child. Talk to your care team for more information. °What side effects may I notice from receiving this medication? °Side effects that you should report to your care team as soon as possible: °Allergic reactions or angioedema--skin rash, itching, hives, swelling of the face, eyes, lips, tongue, arms, or legs, trouble swallowing or breathing °Fast or irregular heartbeat °Gallbladder problems--severe stomach pain, nausea, vomiting, fever °Kidney injury--decrease in the amount of urine, swelling of the ankles, hands, or feet °Pancreatitis--severe stomach pain that spreads to your back or gets worse after eating or when touched, fever, nausea, vomiting °Thoughts of suicide or self-harm, worsening mood, feelings of depression °Thyroid cancer--new mass or lump in the neck, pain or trouble swallowing, trouble breathing, hoarseness °Side effects that usually do not require medical attention (report to your care team if they continue or are bothersome): °Constipation °Dizziness °Fatigue °Headache °Loss of Appetite °Nausea °Upset stomach °This list may not describe all possible side effects. Call your doctor for medical advice about side effects. You may report side effects to FDA at 1-800-FDA-1088. °Where should I keep my medication? °Keep out of the reach of children and pets. °Store unopened pen in a refrigerator between 2 and 8 degrees C (36 and 46 degrees F). Do not freeze or use if the medication has been frozen. Protect from light and excessive heat. After you first use the pen, it can be stored at room temperature between 15 and 30 degrees C (59 and 86 degrees F) or in a refrigerator. Throw away your used pen after 30  days or after the expiration date, whichever comes first. °Do not store your pen with the needle attached. If the needle is left on, medication may leak from the pen. °NOTE: This sheet is a summary. It may not cover all possible information. If you have questions about this medicine, talk to your doctor, pharmacist, or health care provider. °© 2022 Elsevier/Gold Standard (2020-04-05 00:00:00) ° °

## 2021-05-19 ENCOUNTER — Other Ambulatory Visit: Payer: Self-pay

## 2021-05-19 DIAGNOSIS — E6609 Other obesity due to excess calories: Secondary | ICD-10-CM

## 2021-05-19 MED ORDER — SAXENDA 18 MG/3ML ~~LOC~~ SOPN: 3.0000 mg | PEN_INJECTOR | Freq: Every day | SUBCUTANEOUS | 3 refills | Status: DC

## 2021-05-20 ENCOUNTER — Telehealth: Payer: Self-pay

## 2021-05-20 NOTE — Telephone Encounter (Signed)
Saxenda prior authorization completed, submitted and approved.  ?

## 2021-05-22 ENCOUNTER — Other Ambulatory Visit: Payer: Self-pay

## 2021-05-22 DIAGNOSIS — Z6832 Body mass index (BMI) 32.0-32.9, adult: Secondary | ICD-10-CM

## 2021-05-22 MED ORDER — NOVOFINE PLUS PEN NEEDLE 32G X 4 MM MISC: 0 refills | Status: DC

## 2021-05-23 ENCOUNTER — Other Ambulatory Visit: Payer: Self-pay

## 2021-05-23 DIAGNOSIS — E6609 Other obesity due to excess calories: Secondary | ICD-10-CM

## 2021-05-23 MED ORDER — NOVOFINE PLUS PEN NEEDLE 32G X 4 MM MISC: 2 refills | Status: DC

## 2021-05-29 ENCOUNTER — Other Ambulatory Visit: Payer: Self-pay

## 2021-05-29 ENCOUNTER — Encounter: Payer: Self-pay | Admitting: Nurse Practitioner

## 2021-05-29 ENCOUNTER — Ambulatory Visit (INDEPENDENT_AMBULATORY_CARE_PROVIDER_SITE_OTHER): Payer: BC Managed Care – PPO | Admitting: Nurse Practitioner

## 2021-05-29 VITALS — BP 130/80 | HR 110 | Temp 98.1°F | Resp 17 | Ht 63.0 in | Wt 180.6 lb

## 2021-05-29 DIAGNOSIS — J029 Acute pharyngitis, unspecified: Secondary | ICD-10-CM

## 2021-05-29 DIAGNOSIS — R509 Fever, unspecified: Secondary | ICD-10-CM | POA: Diagnosis not present

## 2021-05-29 DIAGNOSIS — E6609 Other obesity due to excess calories: Secondary | ICD-10-CM

## 2021-05-29 DIAGNOSIS — R5383 Other fatigue: Secondary | ICD-10-CM

## 2021-05-29 DIAGNOSIS — Z6832 Body mass index (BMI) 32.0-32.9, adult: Secondary | ICD-10-CM

## 2021-05-29 DIAGNOSIS — G4483 Primary cough headache: Secondary | ICD-10-CM | POA: Diagnosis not present

## 2021-05-29 LAB — POC INFLUENZA A&B (BINAX/QUICKVUE)
Influenza A, POC: NEGATIVE
Influenza B, POC: NEGATIVE

## 2021-05-29 LAB — POC COVID19 BINAXNOW: SARS Coronavirus 2 Ag: NEGATIVE

## 2021-05-29 LAB — POCT RAPID STREP A (OFFICE): Rapid Strep A Screen: NEGATIVE

## 2021-05-29 MED ORDER — NOVOFINE PLUS PEN NEEDLE 32G X 4 MM MISC: 2 refills | Status: DC

## 2021-05-29 NOTE — Progress Notes (Signed)
Assessment and Plan: ? ?[place] was seen today for a new problem. ? ?Diagnoses and all orders for this visit: ? ?1. Cough headache ?- POC Influenza A&B (Binax test) ?- POC COVID-19 ?Continue Cetirizine, Flonase.   ?IBU for headache.   ? ?2. Sore throat ?- POCT rapid strep A ?-Strep Cx Pending ?Warm salt water gargles several times throughout the day. ?Chloraseptic spray/lozenges as needed. ? ?3. Fever, unspecified fever cause ?Continue Tylenol OTC.   ?Fluids/hydration. ? ?4. Other fatigue ?Rest.   ? ?5. Class 1 obesity due to excess calories without serious comorbidity with body mass index (BMI) of 32.0 to 32.9 in adult ?Continue Saxenda as directed.  Will send in needles. ? ?Lack of antibiotic effectiveness discussed with her. Call or return to clinic prn if these symptoms worsen or fail to improve as anticipated. ? ? ?Further disposition pending results of labs. Discussed med's effects and SE's.   ?Over 30 minutes of exam, counseling, chart review, and critical decision making was performed.  ? ?Future Appointments  ?Date Time Provider [place]  ?07/16/2021  4:00 PM Mull, Townsend Roger, NP [facility] None  ?09/30/2021  3:00 PM [name], NP [facility] None  ? ? ?------------------------------------------------------------------------------------------------------------------ ? ? ?HPI ?BP 130/80   Pulse (!) 110   Temp 98.1 ?F (36.7 ?C)   Resp 17   Ht 5\' 3"  (1.6 m)   Wt 180 lb 9.6 oz (81.9 kg)   SpO2 99%   BMI 31.99 kg/m?  ? ?26 y.o.female with a hx of obesity, allergic rhinitis presents for chills, body aches, cough, sore throat, headache, fever of 101 F, joint pain, abdominal pain x 3 days. She has taken TheraFlu, DayQuil, NyQuil with  minimal relief.  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  She reports that her work Social worker was dx with Strep last week.  She denies a history of asthma/COPD.  Denies smoking  cigarettes/vaping.  Her RST Covid, Flu, Strep are NEGATIVE.  ? ?Of note she has lost 10 lb from starting [place] in 03/2021.  She has been using Wegovy sample with minimal SE. Reports mild heartburn.  She was prescribed Saxenda but has not yet used d/t not having the needles available.  ? ? ?Past Medical History:  ?Diagnosis Date  ? Allergy   ? Unspecified vitamin D deficiency   ?  ? ?No Known Allergies ? ?Current Outpatient Medications on File Prior to Visit  ?Medication Sig  ? Cetirizine HCl 10 MG CAPS Take 10 mg by mouth daily.  ? citalopram (CELEXA) 20 MG tablet TAKE 1 TABLET BY MOUTH DAILY FOR MOOD  ? drospirenone-ethinyl estradiol (SYEDA) 3-0.03 MG tablet Take 1 tablet Daily  ? Ferrous Sulfate (IRON PO) Take by mouth daily.  ? fluticasone (FLONASE) 50 MCG/ACT nasal spray Place 1 spray into both nostrils daily.  ? ibuprofen (ADVIL) 200 MG tablet Take 200 mg by mouth every 6 (six) hours as needed. Takes 4 tablets prn  ? Liraglutide -Weight Management (SAXENDA) 18 MG/3ML SOPN Inject 3 mg into the skin daily. Inject one pen SQ daily for weight loss  ? Vitamin D, Cholecalciferol, 25 MCG (1000 UT) TABS Take by mouth.  ? ?No current facility-administered medications on file prior to visit.  ? ? ?ROS: all negative except what is noted in the HPI.  ? ?Physical Exam: ? ?BP 130/80   Pulse (!) 110   Temp 98.1 ?F (  36.7 ?C)   Resp 17   Ht 5\' 3"  (1.6 m)   Wt 180 lb 9.6 oz (81.9 kg)   SpO2 99%   BMI 31.99 kg/m?  ? ?General Appearance: Obese. NAD.  Awake, conversant and cooperative. ?Eyes: PERRLA, EOMs intact.  Sclera white.  Conjunctiva without erythema. ?Sinuses: No frontal/maxillary tenderness.  No nasal discharge. Nares patent.  ?ENT/Mouth: Ext aud canals clear.  Bilateral TMs w/DOL and without erythema or bulging. Hearing intact.  Right posterior pharynx with moderate erythema.  No exudate.  Tonsils absent.  ?Neck: Supple.  No masses, nodules or thyromegaly. ?Respiratory: Effort is regular with  non-labored breathing. Breath sounds are equal bilaterally without rales, rhonchi, wheezing or stridor.  ?Cardio: RRR with no MRGs. Brisk peripheral pulses without edema.  ?Abdomen: Active BS in all four quadrants.  Soft and non-tender without guarding, rebound tenderness, hernias or masses. ?Lymphatics: Non tender without lymphadenopathy.  ?Musculoskeletal: Full ROM, 5/5 strength, normal ambulation.  No clubbing or cyanosis. ?Skin: Appropriate color for ethnicity. Warm without rashes, lesions, ecchymosis, ulcers.  ?Neuro: CN II-XII grossly normal. Normal muscle tone without cerebellar symptoms and intact sensation.   ?Psych: AO X 3,  appropriate mood and affect, insight and judgment.  ?  ? ?Darrol Jump, NP ?3:56 PM ?[facility] ? ?

## 2021-05-31 LAB — CULTURE, GROUP A STREP
MICRO NUMBER:: 13143531
SPECIMEN QUALITY:: ADEQUATE

## 2021-06-02 ENCOUNTER — Encounter: Payer: Self-pay | Admitting: Nurse Practitioner

## 2021-06-30 ENCOUNTER — Encounter: Payer: Self-pay | Admitting: Nurse Practitioner

## 2021-07-15 NOTE — Progress Notes (Signed)
Assessment and Plan: ? ?Beth Thomas was seen today for acute visit. ? ?Diagnoses and all orders for this visit: ? ?Class 1 obesity due to excess calories without serious comorbidity with body mass index (BMI) of 32.0 to 32.9 in adult ?Long discussion about weight loss, diet, and exercise ?Recommended diet heavy in fruits and veggies and low in animal meats, cheeses, and dairy products, appropriate calorie intake ?Patient will work on increasing protein and decrease processed /simple carbs, increase exercise ?Continue Optavia ?Follow up at next visit  ?Continue Saxenda and start Phentermine 37.5mg  daily ?Strongly encouraged to increase exercise ? ? ?Medication management ?-     Magnesium ? ?Mixed hyperlipidemia ?Continue diet and exercise ?-     CBC with Differential/Platelet ?-     COMPLETE METABOLIC PANEL WITH GFR ?-     Lipid panel ? ? ?  ?  ? ? ? ?Further disposition pending results of labs. Discussed med's effects and SE's.   ?Over 30 minutes of exam, counseling, chart review, and critical decision making was performed.  ? ?Future Appointments  ?Date Time Provider [place]  ?09/30/2021  3:00 PM [name], NP [facility] None  ? ? ?------------------------------------------------------------------------------------------------------------------ ? ? ?HPI ?BP 118/70   Pulse 75   Temp (!) 97.3 ?F (36.3 ?C)   Wt 180 lb 6.4 oz (81.8 kg)   SpO2 97%   BMI 31.96 kg/m?  ?Beth Thomas presents for discussion about weight loss.  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 She also works at [employer] on the weekends. 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  ? ?BMI is Body mass index is 31.96 kg/m?., she has  been working on diet and exercise. She is currently on Saxenda 2.4mg  daily. She does note that it is controlling her appetite.  She has not been doing much exercise. She will notice it does well at new dose for several days and then  appetite will return. ?Wt Readings from Last 3 Encounters:  ?07/16/21 180 lb 6.4 oz (81.8 kg)  ?05/29/21 180 lb 9.6 oz (81.9 kg)  ?04/15/21 185 lb 12.8 oz (84.3 kg)  ? ? ?Last lipid was not at goal and she is not currently on medication.  She is focusing on diet and exercise ?Lab Results  ?Component Value Date  ? CHOL 255 (H) 09/30/2020  ? HDL 57 09/30/2020  ? LDLCALC 156 (H) 09/30/2020  ? TRIG 259 (H) 09/30/2020  ? CHOLHDL 4.5 09/30/2020  ?  ? ?Past Medical History:  ?Diagnosis Date  ? Allergy   ? Unspecified vitamin D deficiency   ?  ? ?No Known Allergies ? ?Current Outpatient Medications on File Prior to Visit  ?Medication Sig  ? Cetirizine HCl 10 MG CAPS Take 10 mg by mouth daily.  ? citalopram (CELEXA) 20 MG tablet TAKE 1 TABLET BY MOUTH DAILY FOR MOOD  ? Cyanocobalamin (B-12 PO) Take by mouth.  ? drospirenone-ethinyl estradiol (SYEDA) 3-0.03 MG tablet Take 1 tablet Daily  ? Ferrous Sulfate (IRON PO) Take by mouth daily.  ? fluticasone (FLONASE) 50 MCG/ACT nasal spray Place 1 spray into both nostrils daily.  ? ibuprofen (ADVIL) 200 MG tablet Take 200 mg by mouth every 6 (six) hours as needed. Takes 4 tablets prn  ? Liraglutide -Weight Management (SAXENDA) 18 MG/3ML SOPN Inject 3 mg into the skin daily. Inject one pen SQ daily for weight loss  ? Vitamin D, Cholecalciferol, 25  MCG (1000 UT) TABS Take by mouth.  ? Insulin Pen Needle (NOVOFINE PLUS PEN NEEDLE) 32G X 4 MM MISC Use new needle daily on prefilled pen syringe  ? ?No current facility-administered medications on file prior to visit.  ? ? ?ROS: all negative except above.  ? ?Physical Exam: ? ?BP 118/70   Pulse 75   Temp (!) 97.3 ?F (36.3 ?C)   Wt 180 lb 6.4 oz (81.8 kg)   SpO2 97%   BMI 31.96 kg/m?  ? ?General Appearance: Well nourished, in no apparent distress. ?Eyes: PERRLA, EOMs, conjunctiva no swelling or erythema ?Sinuses: No Frontal/maxillary tenderness ?ENT/Mouth: Ext aud canals clear, TMs without erythema, bulging. No erythema, swelling, or  exudate on post pharynx.  Tonsils not swollen or erythematous. Hearing normal.  ?Neck: Supple, thyroid normal.  ?Respiratory: Respiratory effort normal, BS equal bilaterally without rales, rhonchi, wheezing or stridor.  ?Cardio: RRR with no MRGs. Brisk peripheral pulses without edema.  ?Abdomen: Soft, + BS.  Non tender, no guarding, rebound, hernias, masses. ?Lymphatics: Non tender without lymphadenopathy.  ?Musculoskeletal: Full ROM, 5/5 strength, normal gait.  ?Skin: Warm, dry without rashes, lesions, ecchymosis.  ?Neuro: Cranial nerves intact. Normal muscle tone, no cerebellar symptoms. Sensation intact.  ?Psych: Awake and oriented X 3, normal affect, Insight and Judgment appropriate.  ?  ? ?[name], NP ?4:21 PM ?[facility] ? ?

## 2021-07-16 ENCOUNTER — Encounter: Payer: Self-pay | Admitting: Nurse Practitioner

## 2021-07-16 ENCOUNTER — Ambulatory Visit (INDEPENDENT_AMBULATORY_CARE_PROVIDER_SITE_OTHER): Payer: BC Managed Care – PPO | Admitting: Nurse Practitioner

## 2021-07-16 VITALS — BP 118/70 | HR 75 | Temp 97.3°F | Wt 180.4 lb

## 2021-07-16 DIAGNOSIS — E782 Mixed hyperlipidemia: Secondary | ICD-10-CM

## 2021-07-16 DIAGNOSIS — Z79899 Other long term (current) drug therapy: Secondary | ICD-10-CM | POA: Diagnosis not present

## 2021-07-16 DIAGNOSIS — E6609 Other obesity due to excess calories: Secondary | ICD-10-CM

## 2021-07-16 DIAGNOSIS — G43009 Migraine without aura, not intractable, without status migrainosus: Secondary | ICD-10-CM

## 2021-07-16 DIAGNOSIS — Z6834 Body mass index (BMI) 34.0-34.9, adult: Secondary | ICD-10-CM

## 2021-07-16 MED ORDER — PHENTERMINE HCL 37.5 MG PO TABS: ORAL_TABLET | ORAL | 5 refills | Status: DC

## 2021-07-17 ENCOUNTER — Other Ambulatory Visit: Payer: Self-pay | Admitting: Nurse Practitioner

## 2021-07-17 ENCOUNTER — Telehealth: Payer: Self-pay

## 2021-07-17 DIAGNOSIS — E782 Mixed hyperlipidemia: Secondary | ICD-10-CM

## 2021-07-17 LAB — CBC WITH DIFFERENTIAL/PLATELET
Absolute Monocytes: 608 cells/uL (ref 200–950)
Basophils Absolute: 40 cells/uL (ref 0–200)
Basophils Relative: 0.5 %
Eosinophils Absolute: 316 cells/uL (ref 15–500)
Eosinophils Relative: 4 %
HCT: 39.7 % (ref 35.0–45.0)
Hemoglobin: 13.3 g/dL (ref 11.7–15.5)
Lymphs Abs: 3318 cells/uL (ref 850–3900)
MCH: 29.5 pg (ref 27.0–33.0)
MCHC: 33.5 g/dL (ref 32.0–36.0)
MCV: 88 fL (ref 80.0–100.0)
MPV: 11.6 fL (ref 7.5–12.5)
Monocytes Relative: 7.7 %
Neutro Abs: 3618 cells/uL (ref 1500–7800)
Neutrophils Relative %: 45.8 %
Platelets: 293 10*3/uL (ref 140–400)
RBC: 4.51 10*6/uL (ref 3.80–5.10)
RDW: 12.8 % (ref 11.0–15.0)
Total Lymphocyte: 42 %
WBC: 7.9 10*3/uL (ref 3.8–10.8)

## 2021-07-17 LAB — COMPLETE METABOLIC PANEL WITH GFR
AG Ratio: 1.2 (calc) (ref 1.0–2.5)
ALT: 10 U/L (ref 6–29)
AST: 15 U/L (ref 10–30)
Albumin: 4.3 g/dL (ref 3.6–5.1)
Alkaline phosphatase (APISO): 41 U/L (ref 31–125)
BUN: 13 mg/dL (ref 7–25)
CO2: 28 mmol/L (ref 20–32)
Calcium: 10 mg/dL (ref 8.6–10.2)
Chloride: 101 mmol/L (ref 98–110)
Creat: 0.7 mg/dL (ref 0.50–0.96)
Globulin: 3.6 g/dL (calc) (ref 1.9–3.7)
Glucose, Bld: 78 mg/dL (ref 65–99)
Potassium: 4.1 mmol/L (ref 3.5–5.3)
Sodium: 138 mmol/L (ref 135–146)
Total Bilirubin: 0.3 mg/dL (ref 0.2–1.2)
Total Protein: 7.9 g/dL (ref 6.1–8.1)
eGFR: 121 mL/min/{1.73_m2} (ref >=60)

## 2021-07-17 LAB — MAGNESIUM: Magnesium: 2.2 mg/dL (ref 1.5–2.5)

## 2021-07-17 LAB — LIPID PANEL
Cholesterol: 238 mg/dL — ABNORMAL HIGH (ref <200)
HDL: 56 mg/dL (ref >=50)
LDL Cholesterol (Calc): 149 mg/dL (calc) — ABNORMAL HIGH
Non-HDL Cholesterol (Calc): 182 mg/dL (calc) — ABNORMAL HIGH (ref <130)
Total CHOL/HDL Ratio: 4.3 (calc) (ref <5.0)
Triglycerides: 194 mg/dL — ABNORMAL HIGH (ref <150)

## 2021-07-17 MED ORDER — ROSUVASTATIN CALCIUM 5 MG PO TABS: 5.0000 mg | ORAL_TABLET | Freq: Every day | ORAL | 11 refills | Status: DC

## 2021-07-17 NOTE — Telephone Encounter (Signed)
Prior Auth for Phentermine approved through 10/17/21 ?

## 2021-07-24 ENCOUNTER — Encounter: Payer: BC Managed Care – PPO | Admitting: Adult Health

## 2021-09-22 ENCOUNTER — Telehealth: Payer: Self-pay

## 2021-09-22 NOTE — Telephone Encounter (Addendum)
Prior Authorization for [employer] submitted.

## 2021-09-22 NOTE — Telephone Encounter (Signed)
Prior Authorization is approved: 09/22/21-01/23/22

## 2021-09-30 ENCOUNTER — Encounter: Payer: BC Managed Care – PPO | Admitting: Nurse Practitioner

## 2021-10-01 ENCOUNTER — Ambulatory Visit: Payer: BC Managed Care – PPO | Admitting: Orthopaedic Surgery

## 2021-10-01 ENCOUNTER — Encounter: Payer: Self-pay | Admitting: Orthopaedic Surgery

## 2021-10-01 ENCOUNTER — Ambulatory Visit (INDEPENDENT_AMBULATORY_CARE_PROVIDER_SITE_OTHER): Payer: BC Managed Care – PPO

## 2021-10-01 DIAGNOSIS — M545 Low back pain, unspecified: Secondary | ICD-10-CM

## 2021-10-01 MED ORDER — PREDNISONE 50 MG PO TABS: ORAL_TABLET | ORAL | 0 refills | Status: DC

## 2021-10-01 MED ORDER — METHOCARBAMOL 750 MG PO TABS: 750.0000 mg | ORAL_TABLET | Freq: Three times a day (TID) | ORAL | 1 refills | Status: DC | PRN

## 2021-10-01 NOTE — Progress Notes (Signed)
The patient is a very pleasant 26 year old female that we have seen in the past.  She does have well-documented scoliosis involving mainly her lumbar spine.  When we saw her in 2019 she was having right lower extremity radicular symptoms that have since resolved.  About 4 to 5 weeks ago she started to notice pain in the left side of the lower back.  It has been radiating to the sciatic region but not down her left leg.  She has not tried any anti-inflammatories and the not had any therapy.  She denies any weakness in her legs and denies any change in bowel or bladder function.  She feels like there is a "knot" to the left side of her lumbar spine.  Again there is no numbness and tingling.  She has not had any type of therapy and she has appropriate questions about seeing her chiropractor as well.  On exam she does have good flexion extension of her lumbar spine but is painful to the paraspinal muscles on the left side.  She does have a positive straight leg raise on the left side but there is no radicular symptoms going down her leg on the left or right side.  She has 5 out of 5 strength and normal sensation as well.  X-rays of the lumbar spine are seen today and compared to previous films from 4 years ago.  She does have a lumbar scoliosis but it is unchanged from previous films over 4 years.  There is no acute findings otherwise.  I would like to try 5 days of prednisone 50 mg as well as methocarbamol.  I would also like to send her to outpatient physical therapy and I agree with chiropractic treatment.  We can see her back in a few weeks to see if she has responded to this treatment plan.  If not, we would likely order MRI of the lumbar spine.  All questions and concerns were answered and addressed.

## 2021-10-01 NOTE — Addendum Note (Signed)
Addended by: Barbette Or on: 10/01/2021 03:18 PM   Modules accepted: Orders

## 2021-10-13 ENCOUNTER — Ambulatory Visit: Payer: BC Managed Care – PPO | Admitting: Physical Therapy

## 2021-10-13 ENCOUNTER — Encounter: Payer: Self-pay | Admitting: Physical Therapy

## 2021-10-13 DIAGNOSIS — M5459 Other low back pain: Secondary | ICD-10-CM | POA: Diagnosis not present

## 2021-10-13 DIAGNOSIS — M6281 Muscle weakness (generalized): Secondary | ICD-10-CM | POA: Diagnosis not present

## 2021-10-13 NOTE — Therapy (Addendum)
OUTPATIENT PHYSICAL THERAPY THORACOLUMBAR EVALUATION   Patient Name: Beth Thomas MRN: [id number] DOB:Dec 31, 1995, 26 y.o., female Today's Date: 10/13/2021   PT End of Session - 10/13/21 1010     Visit Number 1    Number of Visits 6    Date for PT Re-Evaluation 11/28/21    Authorization Type Pt has a $52 co pay    PT Start Time 0930    PT Stop Time 1010    PT Time Calculation (min) 40 min    Activity Tolerance Patient tolerated treatment well    Behavior During Therapy WFL for tasks assessed/performed             Past Medical History:  Diagnosis Date   Allergy    Unspecified vitamin D deficiency    Past Surgical History:  Procedure Laterality Date   TONSILECTOMY/ADENOIDECTOMY WITH MYRINGOTOMY  01/2016   Patient Active Problem List   Diagnosis Date Noted   Class 1 obesity due to excess calories without serious comorbidity with body mass index (BMI) of 34.0 to 34.9 in adult 09/30/2020   Mixed hyperlipidemia 06/27/2019   Anxiety 06/22/2017   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   Vitamin D deficiency 05/14/2015   Other abnormal glucose 05/14/2015   Allergic rhinitis 01/26/2013    PCP: Lucky Cowboy MD  REFERRING PROVIDER: Kathryne Hitch MD  REFERRING DIAG:  M54.50 (ICD-10-CM) - Acute left-sided low back pain without sciatica  Rationale for Evaluation and Treatment Rehabilitation  THERAPY DIAG:  Other low back pain - Plan: PT plan of care cert/re-cert  Muscle weakness (generalized) - Plan: PT plan of care cert/re-cert  ONSET DATE: about 1 month ago  SUBJECTIVE:                                                                                                                                                                                           SUBJECTIVE STATEMENT: Pt arriving today reporting 2-3/10 pain  which has been ongoing for about 1 month. Pt stating she originally thought it was associated with her menstrual cycle but after her cycle was  complete her back pain continued. Pt sating her pain is consistent all day, but she noticed it gets worse after sitting with her legs crossed on the floor that there is much more stiffness in her low back.   PERTINENT HISTORY:  allergies, vit D deficiency  PAIN:  Are you having pain? Yes: NPRS scale: 2-3/10 Pain location: low back  Pain description: achy, Aggravating factors: sitting with legs crossed Relieving factors: muscle relaxer, prednisone    PRECAUTIONS: Other: scoliosis lumbar spine for DN  WEIGHT BEARING RESTRICTIONS No  FALLS:  Has patient fallen in last 6 months? No  LIVING ENVIRONMENT: Lives with: lives with their family Lives in: House/apartment Stairs: Yes: External: 9 steps; on right going up Has following equipment at home: None  OCCUPATION: elementary school teacher  PLOF: Independent  PATIENT GOALS   Stop hurting, be able to go back to work without pain   OBJECTIVE:   DIAGNOSTIC FINDINGS:  2 views of the lumbar spine are seen today and compared to films of the  lumbar spine from about 4 years ago.  There is a lumbar scoliosis that is  unchanged from previous films.  There is no acute findings.  PATIENT SURVEYS:  FOTO 70% 10/13/21, Predicted 78%  SCREENING FOR RED FLAGS: Bowel or bladder incontinence: No Spinal tumors: No Cauda equina syndrome: No Compression fracture: No Abdominal aneurysm: No  COGNITION:  Overall cognitive status: Within functional limits for tasks assessed     SENSATION: WFL  MUSCLE LENGTH: 10/13/21: Hamstrings: Right 100 deg; Left 102 deg  POSTURE: No Significant postural limitations  PALPATION: TTP: left QL, left paraspinals and left piriformis  LUMBAR ROM:   Active  A/PROM  eval  Flexion   Extension 15  Right lateral flexion   Left lateral flexion   Right rotation WNL  Left rotation WNL   (Blank rows = not tested)  LOWER EXTREMITY ROM:     Active  Right eval Left eval  Hip flexion 126 120   Hip  extension 15 15  Hip abduction    Hip adduction     (Blank rows = not tested)  LOWER EXTREMITY MMT:    MMT Right eval Left eval  Hip flexion 5/5 4/5  Hip extension 5/5 4/5  Hip abduction 5/5 5/5  Hip adduction 5/5 5/5  Hip internal rotation    Hip external rotation    Knee flexion 5/5 4/5  Knee extension 5/5 5/5   (Blank rows = not tested)  LUMBAR SPECIAL TESTS:  Straight leg raise test: Negative and Slump test: Negative  FUNCTIONAL TESTS:  5 times sit to stand: 12 seconds no UE support  GAIT: Distance walked: 30 feet  Assistive device utilized: None Level of assistance: Complete Independence Comments: normalized step through gait pattern    TODAY'S TREATMENT  10/13/21 HEP instruction/performance c cues for techniques, handout provided.  Trial set performed of each for comprehension and symptom assessment.  See below for exercise list.   PATIENT EDUCATION:  Education details: PT POC, HEP, STM using tennis ball for low back. Gluteals, and piriformis Person educated: Patient Education method: Explanation, Demonstration, Tactile cues, Verbal cues, and Handouts Education comprehension: verbalized understanding and returned demonstration   HOME EXERCISE PROGRAM: Access Code: [email or web address] URL: [email or web address] Date: 10/13/2021 Prepared by: Narda Amber  Exercises - Supine Lower Trunk Rotation  - 2-3 x daily - 7 x weekly - 3 reps - 30 seconds hold - Supine Single Knee to Chest Stretch  - 2-3 x daily - 7 x weekly - 3 reps - 30 seocnds hold - Supine Piriformis Stretch  - 2-3 x daily - 7 x weekly - 3 reps - 30 seconds hold - Hooklying Hamstring Stretch with Strap  - 2-3 x daily - 7 x weekly - 3 reps - 30 seconds hold - Standing Thoracic Extension at Wall  - 2-3 x daily - 7 x weekly - 5 reps - 10 seconds hold - Standing Shoulder Row with Anchored Resistance  - 2-3 x daily - 7 x weekly - 3 sets -  10 reps - 3 seconds hold  ASSESSMENT:  CLINICAL  IMPRESSION: Patient is a 26 y.o. who comes to clinic with complaints of left sided low back pain with mobility, strength and movement coordination deficits that impair their ability to perform usual daily and recreational functional activities without increase difficulty/symptoms.  Pt was issued HEP for lumbar stretching and with beginning strengthening. Pt with good response to standing extension.  Pt was also edu in STM using a tennis ball for home. Patient to benefit from skilled PT services to address impairments and limitations to improve to previous level of function without restriction secondary to condition.    OBJECTIVE IMPAIRMENTS difficulty walking, decreased ROM, decreased strength, impaired flexibility, and pain.   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   REHAB POTENTIAL: Excellent  CLINICAL DECISION MAKING: Stable/uncomplicated  EVALUATION COMPLEXITY: Low   GOALS: Goals reviewed with patient? Yes  SHORT TERM GOALS: Target date: 11/03/2021  Patient will demonstrate independent use of initial home exercise program to maintain progress from in clinic treatments. Goal status: New   Long term PT goals  target dates 11/28/21 Patient will demonstrate/report pain at worst less than or equal to 2/10 to facilitate minimal limitation in daily activity secondary to pain symptoms. Goal status: New   Patient will demonstrate independent use of home exercise program to facilitate ability to maintain/progress functional gains from skilled physical therapy services. Goal status: New   Patient will demonstrate FOTO outcome > or = 78% to indicate reduced disability due to condition. Goal status: New   Pt will be able to get up and down from floor after sitting criss cross with no low back pain.  Goal  status: New       5.  Pt will improve her Left LE strength to grossly 5/5 for improved functional mobility.   Goal status: New   PLAN: PT FREQUENCY: 1x/week  PT DURATION: 6 weeks  PLANNED INTERVENTIONS: Therapeutic exercises, Therapeutic activity, Neuromuscular re-education, Balance training, Gait training, Patient/Family education, Self Care, Joint mobilization, Stair training, Dry Needling, Electrical stimulation, Cryotherapy, Moist heat, Taping, Traction, Ultrasound, and Manual therapy.  PLAN FOR NEXT SESSION: Measure lumbar ROM, assess lumbar paraspinals and piriformis for DN which has helped in the past, low back strengthening, stretching, prone progression.    Sharmon Leyden, PT, MPT 10/13/2021, 10:32 AM

## 2021-10-15 ENCOUNTER — Ambulatory Visit: Payer: BC Managed Care – PPO | Admitting: Orthopaedic Surgery

## 2021-10-15 ENCOUNTER — Encounter: Payer: Self-pay | Admitting: Orthopaedic Surgery

## 2021-10-15 DIAGNOSIS — M545 Low back pain, unspecified: Secondary | ICD-10-CM | POA: Diagnosis not present

## 2021-10-15 NOTE — Progress Notes (Signed)
Beth Thomas comes in today after having physical therapy on her lumbar spine.  She said the therapist said it was even tight as to the left side and the facet joint areas.  There is no radicular component.  She did drop with a tennis ball behind the cerebral back which is helpful.  She is only 26 years old.  On exam she has negative straight leg raise on that left side.  Her pain is only in the paraspinal muscles to the lower lumbar spine to the left side.  She will continue therapy since this is helpful for her.  If this does not improve, I would likely send her to Dr. Alvester Morin to consider facet joint injections to the left side.  We will see what she looks like in 4 weeks.  She can always cancel his appointment if she is better.

## 2021-10-21 ENCOUNTER — Encounter: Payer: BC Managed Care – PPO | Admitting: Nurse Practitioner

## 2021-10-27 ENCOUNTER — Encounter: Payer: Self-pay | Admitting: Physical Therapy

## 2021-10-27 ENCOUNTER — Ambulatory Visit: Payer: BC Managed Care – PPO | Admitting: Physical Therapy

## 2021-10-27 DIAGNOSIS — M6281 Muscle weakness (generalized): Secondary | ICD-10-CM

## 2021-10-27 DIAGNOSIS — M5459 Other low back pain: Secondary | ICD-10-CM

## 2021-10-27 NOTE — Therapy (Signed)
OUTPATIENT PHYSICAL THERAPY TREATMENT NOTE   Patient Name: Beth Thomas MRN: [id number] DOB:1995-12-01, 26 y.o., female Today's Date: 10/27/2021    END OF SESSION:   PT End of Session - 10/27/21 1405     Visit Number 2    Number of Visits 6    Date for PT Re-Evaluation 11/28/21    Authorization Type Pt has a $52 co pay    PT Start Time 1346    PT Stop Time 1426    PT Time Calculation (min) 40 min    Activity Tolerance Patient tolerated treatment well    Behavior During Therapy WFL for tasks assessed/performed             Past Medical History:  Diagnosis Date   Allergy    Unspecified vitamin D deficiency    Past Surgical History:  Procedure Laterality Date   TONSILECTOMY/ADENOIDECTOMY WITH MYRINGOTOMY  01/2016   Patient Active Problem List   Diagnosis Date Noted   Class 1 obesity due to excess calories without serious comorbidity with body mass index (BMI) of 34.0 to 34.9 in adult 09/30/2020   Mixed hyperlipidemia 06/27/2019   Anxiety 06/22/2017   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   Vitamin D deficiency 05/14/2015   Other abnormal glucose 05/14/2015   Allergic rhinitis 01/26/2013     THERAPY DIAG:  Other low back pain  Muscle weakness (generalized)  PCP: Lucky Cowboy MD   REFERRING PROVIDER: Kathryne Hitch MD   REFERRING DIAG:  M54.50 (ICD-10-CM) - Acute left-sided low back pain without sciatica   Rationale for Evaluation and Treatment Rehabilitation   ONSET DATE: about 1 month ago   SUBJECTIVE:                                                                                                                                                                                            SUBJECTIVE STATEMENT: Pt arriving today reporting the back pain is a little better overall and that the exercises are helping. She went to the beach so some back pain after the car ride.    PERTINENT HISTORY:  allergies, vit D deficiency   PAIN:  Are you having  pain? Yes: NPRS scale: 2-3/10 Pain location: low back  Pain description: achy, Aggravating factors: sitting with legs crossed Relieving factors: muscle relaxer, prednisone      PRECAUTIONS: Other: scoliosis lumbar spine for DN   WEIGHT BEARING RESTRICTIONS No   FALLS:  Has patient fallen in last 6 months? No   LIVING ENVIRONMENT: Lives with: lives with their family Lives in: House/apartment Stairs: Yes: External: 9 steps; on right going up Has following  equipment at home: None   OCCUPATION: elementary school teacher   PLOF: Independent   PATIENT GOALS   Stop hurting, be able to go back to work without pain     OBJECTIVE:    DIAGNOSTIC FINDINGS:  2 views of the lumbar spine are seen today and compared to films of the  lumbar spine from about 4 years ago.  There is a lumbar scoliosis that is  unchanged from previous films.  There is no acute findings.   PATIENT SURVEYS:  FOTO 70% 10/13/21, Predicted 78%   SCREENING FOR RED FLAGS: Bowel or bladder incontinence: No Spinal tumors: No Cauda equina syndrome: No Compression fracture: No Abdominal aneurysm: No   COGNITION:           Overall cognitive status: Within functional limits for tasks assessed                          SENSATION: WFL   MUSCLE LENGTH: 10/13/21: Hamstrings: Right 100 deg; Left 102 deg   POSTURE: No Significant postural limitations   PALPATION: TTP: left QL, left paraspinals and left piriformis   LUMBAR ROM:    Active  AROM  eval 10/27/21  Flexion   WNL  Extension 15 WNL but pain  Right lateral flexion   WNL  Left lateral flexion   WNL but pain   Right rotation WNL WNL but pain  Left rotation WNL WNL but pain   (Blank rows = not tested)   LOWER EXTREMITY ROM:      Active  Right eval Left eval  Hip flexion 126 120   Hip extension 15 15  Hip abduction      Hip adduction       (Blank rows = not tested)   LOWER EXTREMITY MMT:     MMT Right eval Left eval  Hip flexion 5/5 4/5   Hip extension 5/5 4/5  Hip abduction 5/5 5/5  Hip adduction 5/5 5/5  Hip internal rotation      Hip external rotation      Knee flexion 5/5 4/5  Knee extension 5/5 5/5   (Blank rows = not tested)   LUMBAR SPECIAL TESTS:  Straight leg raise test: Negative and Slump test: Negative   FUNCTIONAL TESTS:  5 times sit to stand: 12 seconds no UE support   GAIT: Distance walked: 30 feet  Assistive device utilized: None Level of assistance: Complete Independence Comments: normalized step through gait pattern       TODAY'S TREATMENT  10/27/21 Manual therapy for skilled palpation and active compression with Trigger Point Dry-Needling  Treatment instructions: Expect mild to moderate muscle soreness. S/S of pneumothorax if dry needled over a lung field, and to seek immediate medical attention should they occur. Patient verbalized understanding of these instructions and education.  Patient Consent Given: Yes Education handout provided: Yes Muscles treated: lumbar P.S/multifidi, and Glute/piriformis on left Treatment response/outcome: twitch response noted   Used Estim mico current at 100 frequency and milli amp current at 2 frequency 3 min each X 4 -Therex  -Prone on elbows stretch 10 sec X 5  -Quadriped alt arm extensions X 10 each holding 5 sec  -Quadriped alt leg extensions X 10 each holding 5 sec  -Supine LTR 10 sec X 5 bilat  -Supine SKTC stretch 30 sec X 2 both sides  -Supine bridges 5 sec X 15  -Standing wall slides into shoulder flexion then extending off wall holding thoracic extension  5 sec X10   10/13/21 HEP instruction/performance c cues for techniques, handout provided.  Trial set performed of each for comprehension and symptom assessment.  See below for exercise list.     PATIENT EDUCATION:  Education details: PT POC, HEP, STM using tennis ball for low back. Gluteals, and piriformis Person educated: Patient Education method: Explanation, Demonstration, Tactile cues,  Verbal cues, and Handouts Education comprehension: verbalized understanding and returned demonstration     HOME EXERCISE PROGRAM: Access Code: [email or web address] URL: [email or web address] Date: 10/13/2021 Prepared by: Narda Amber   Exercises - Supine Lower Trunk Rotation  - 2-3 x daily - 7 x weekly - 3 reps - 30 seconds hold - Supine Single Knee to Chest Stretch  - 2-3 x daily - 7 x weekly - 3 reps - 30 seocnds hold - Supine Piriformis Stretch  - 2-3 x daily - 7 x weekly - 3 reps - 30 seconds hold - Hooklying Hamstring Stretch with Strap  - 2-3 x daily - 7 x weekly - 3 reps - 30 seconds hold - Standing Thoracic Extension at Wall  - 2-3 x daily - 7 x weekly - 5 reps - 10 seconds hold - Standing Shoulder Row with Anchored Resistance  - 2-3 x daily - 7 x weekly - 3 sets - 10 reps - 3 seconds hold   ASSESSMENT:   CLINICAL IMPRESSION: Trial of DN today in combo with Estim in efforts to reduce tightness and pain in her left low back and SIJ region. This was followed up with stretching and mobility exercises to her tolerance. Her low back ROM appears to have improved overall since eval. She had good overall tolerance to treatment today.      OBJECTIVE IMPAIRMENTS difficulty walking, decreased ROM, decreased strength, impaired flexibility, and pain.    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    REHAB POTENTIAL: Excellent   CLINICAL DECISION MAKING: Stable/uncomplicated   EVALUATION COMPLEXITY: Low     GOALS: Goals reviewed with patient? Yes   SHORT TERM GOALS: Target date: 11/03/2021   Patient will demonstrate independent use of initial home exercise program to maintain progress from in clinic treatments. Goal status: New   Long term PT goals  target dates 11/28/21 Patient will  demonstrate/report pain at worst less than or equal to 2/10 to facilitate minimal limitation in daily activity secondary to pain symptoms. Goal status: New   Patient will demonstrate independent use of home exercise program to facilitate ability to maintain/progress functional gains from skilled physical therapy services. Goal status: New   Patient will demonstrate FOTO outcome > or = 78% to indicate reduced disability due to condition. Goal status: New   Pt will be able to get up and down from floor after sitting criss cross with no low back pain.  Goal status: New       5.  Pt will improve her Left LE strength to grossly 5/5 for improved functional mobility.   Goal status: New     PLAN: PT FREQUENCY: 1x/week   PT DURATION: 6 weeks   PLANNED INTERVENTIONS: Therapeutic exercises, Therapeutic activity, Neuromuscular re-education, Balance training, Gait training, Patient/Family education, Self Care, Joint mobilization, Stair training, Dry Needling, Electrical stimulation, Cryotherapy, Moist heat, Taping, Traction, Ultrasound, and Manual therapy.   PLAN FOR NEXT SESSION: How was DN from  last time? Continue low back strengthening, stretching, prone progression.    April Manson, PT,DPT 10/27/2021, 2:27 PM

## 2021-11-03 ENCOUNTER — Encounter: Payer: BC Managed Care – PPO | Admitting: Physical Therapy

## 2021-11-03 NOTE — Progress Notes (Unsigned)
Complete Physical  Assessment and Plan:  Beth Thomas was seen today for annual exam.  Diagnoses and all orders for this visit:  Encounter for general adult medical examination with abnormal findings -     CBC with Differential/Platelet -     COMPLETE METABOLIC PANEL WITH GFR -     Lipid panel -     TSH -     Hemoglobin A1c -     VITAMIN D 25 Hydroxy (Vit-D Deficiency, Fractures) -     Magnesium -     Urinalysis, Routine w reflex microscopic -     Microalbumin / creatinine urine ratio  Mixed hyperlipidemia Continue Rosuvastatin,diet and exercise -     COMPLETE METABOLIC PANEL WITH GFR -     Lipid panel  Other abnormal glucose Continue diet and exercise -     Hemoglobin A1c  Anxiety  Continue Celxa and monitor symptoms  Vitamin D deficiency -     VITAMIN D 25 Hydroxy (Vit-D Deficiency, Fractures)  Medication management -     CBC with Differential/Platelet -     COMPLETE METABOLIC PANEL WITH GFR -     Lipid panel -     TSH -     Hemoglobin A1c -     VITAMIN D 25 Hydroxy (Vit-D Deficiency, Fractures) -     Magnesium -     Urinalysis, Routine w reflex microscopic -     Microalbumin / creatinine urine ratio  Obesity BMI 34-34.9  Continue medication, diet and exercise Over 10 mintues spent counseling patient on diet and exercise.  Pt craves sweets, recommend L-Glutamine 500 mg three times a day to control cravings.   Migraine without aura and without status migrainosus, not intractable  No longer using Imitrex, migraines are fewer and controlled with Advil  Allergic rhinitis, unspecified seasonality, unspecified trigger  Continue Claritin daily and Flonase as needed  Screening for thyroid disorder -     TSH  Screening for hematuria or proteinuria -     Urinalysis, Routine w reflex microscopic -     Microalbumin / creatinine urine ratio       Discussed med's effects and SE's. Screening labs and tests as requested with regular follow-up as recommended. Over 40  minutes of exam, counseling, chart review and critical decision making was performed  HPI  This very nice 26 y.o.female presents for complete physical.   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 Also works part time at [employer].  She has been having less migraines and has not had to use Imitrex controlled with Advil.  She is sexually active, with BCP she has been having very regular menses. LMP 09/10/20  She does workout, she is trying to do 5 days a week.   Finally, patient has history of Vitamin D Deficiency, not on medication consistently.  Lab Results  Component Value Date   VD25OH 36 09/30/2020  '  BMI is There is no height or weight on file to calculate BMI., she is working on diet and exercise. She is currently walking 3-4 miles 3-5 days a week.  Wt Readings from Last 3 Encounters:  07/16/21 180 lb 6.4 oz (81.8 kg)  05/29/21 180 lb 9.6 oz (81.9 kg)  04/15/21 185 lb 12.8 oz (84.3 kg)   The cholesterol last visit was:   Lab Results  Component Value Date   CHOL 238 (H) 07/16/2021   HDL 56 07/16/2021   LDLCALC  149 (H) 07/16/2021   TRIG 194 (H) 07/16/2021   CHOLHDL 4.3 07/16/2021   Last C7E in the office was:  Lab Results  Component Value Date   HGBA1C 5.4 09/30/2020      Current Medications:  Current Outpatient Medications on File Prior to Visit  Medication Sig Dispense Refill   Cetirizine HCl 10 MG CAPS Take 10 mg by mouth daily.     citalopram (CELEXA) 20 MG tablet TAKE 1 TABLET BY MOUTH DAILY FOR MOOD 90 tablet 3   Cyanocobalamin (B-12 PO) Take by mouth.     drospirenone-ethinyl estradiol (SYEDA) 3-0.03 MG tablet Take 1 tablet Daily 84 tablet 3   Ferrous Sulfate (IRON PO) Take by mouth daily.     fluticasone (FLONASE) 50 MCG/ACT nasal spray Place 1 spray into both nostrils daily. 16 g 2   ibuprofen (ADVIL) 200 MG tablet Take 200 mg by mouth every 6 (six) hours as needed. Takes 4 tablets prn      Insulin Pen Needle (NOVOFINE PLUS PEN NEEDLE) 32G X 4 MM MISC Use new needle daily on prefilled pen syringe 100 each 2   Liraglutide -Weight Management (SAXENDA) 18 MG/3ML SOPN Inject 3 mg into the skin daily. Inject one pen SQ daily for weight loss 15 mL 3   methocarbamol (ROBAXIN) 750 MG tablet Take 1 tablet (750 mg total) by mouth every 8 (eight) hours as needed for muscle spasms. 40 tablet 1   phentermine (ADIPEX-P) 37.5 MG tablet Take 1/2 to 1 tablet every morning for dieting & weightloss 30 tablet 5   predniSONE (DELTASONE) 50 MG tablet Take one tablet daily for 5 days. 5 tablet 0   rosuvastatin (CRESTOR) 5 MG tablet Take 1 tablet (5 mg total) by mouth daily. 30 tablet 11   Vitamin D, Cholecalciferol, 25 MCG (1000 UT) TABS Take by mouth.     No current facility-administered medications on file prior to visit.   Health Maintenance:   Immunization History  Administered Date(s) Administered   HPV 9-valent 10/10/2014, 12/12/2014, 05/14/2015   Influenza Inj Mdck Quad With Preservative 11/17/2016   Influenza-Unspecified 11/17/2016   PPD Test 09/12/2018   Tdap 11/15/2006, 06/04/2016   TD/TDAP: 2018 Influenza: Due 12/2020 Pneumovax: N/a Prevnar 13: N/a HPV vaccine  Completed COVID vaccine- did not have  LMP: 09/10/20 Sexually Active: on BCP, she is sexually active declines STD testing Pap: 2020 normal, done today   Medical History:  Past Medical History:  Diagnosis Date   Allergy    Unspecified vitamin D deficiency    Allergies No Known Allergies  SURGICAL HISTORY She  has a past surgical history that includes Tonsilectomy/adenoidectomy with myringotomy (01/2016). FAMILY HISTORY Her family history includes Diabetes in her mother; Hyperlipidemia in her father and mother; Hypertension in her father and mother. SOCIAL HISTORY She  reports that she has never smoked. She has been exposed to tobacco smoke. She has never used smokeless tobacco. She reports that she does not  drink alcohol and does not use drugs. 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  Negative for chills, fever, malaise/fatigue and weight loss.  HENT: Negative.  Negative for congestion, ear discharge, hearing loss, sore throat and tinnitus.   Eyes: Negative.  Negative for blurred vision, discharge and redness.  Respiratory: Negative.  Negative for cough, shortness of breath and wheezing.   Cardiovascular:  Negative.  Negative for chest pain, palpitations, orthopnea and leg swelling.  Gastrointestinal: Negative.  Negative for abdominal pain, blood in stool, constipation, diarrhea, heartburn, nausea and vomiting.  Genitourinary: Negative.  Negative for dysuria, frequency and urgency.  Musculoskeletal: Negative.  Negative for back pain, falls, joint pain and myalgias.  Skin:  Negative for itching and rash.  Neurological:  Positive for headaches (rare migraines). Negative for dizziness, tingling, seizures and weakness.  Endo/Heme/Allergies: Negative.  Does not bruise/bleed easily.  Psychiatric/Behavioral: Negative.  Negative for depression, hallucinations, memory loss and suicidal ideas. The patient does not have insomnia.     Physical Exam: Estimated body mass index is 31.96 kg/m as calculated from the following:   Height as of 05/29/21: 5\' 3"  (1.6 m).   Weight as of 07/16/21: 180 lb 6.4 oz (81.8 kg). There were no vitals taken for this visit. General Appearance: Well nourished, in no apparent distress.  Eyes: PERRLA, EOMs, conjunctiva no swelling or erythema, normal fundi and vessels. Right outer eye with erythematous scaly area with bumps.   Sinuses: No Frontal/maxillary tenderness  ENT/Mouth: Ext aud canals clear, normal light reflex with TMs without erythema, bulging. Good dentition. No erythema, swelling, or exudate on post  pharynx. Tonsils not swollen or erythematous. Hearing normal.  Neck: Supple, thyroid normal. No bruits  Respiratory: Respiratory effort normal, BS equal bilaterally without rales, rhonchi, wheezing or stridor.  Cardio: RRR without murmurs, rubs or gallops. Brisk peripheral pulses without edema.  Chest: symmetric, with normal excursions and percussion.  Breasts: Symmetric, without lumps, nipple discharge, retractions.  Abdomen: Soft, nontender, no guarding, rebound, hernias, masses, or organomegaly.  Lymphatics: Non tender without lymphadenopathy.  Genitourinary: normal external genitalia, vulva, vagina, cervix, uterus and adnexa, PAP: Pap smear done today. Musculoskeletal: Full ROM all peripheral extremities,5/5 strength, and normal gait.  Skin: Warm, dry without rashes, lesions, ecchymosis. Neuro: Cranial nerves intact, reflexes equal bilaterally. Normal muscle tone, no cerebellar symptoms. Sensation intact.  Psych: Awake and oriented X 3, normal affect, Insight and Judgment appropriate.   EKG: defer  Brandice Busser E  8:54 AM [facility]

## 2021-11-04 ENCOUNTER — Ambulatory Visit: Payer: BC Managed Care – PPO | Admitting: Physical Therapy

## 2021-11-04 ENCOUNTER — Ambulatory Visit (INDEPENDENT_AMBULATORY_CARE_PROVIDER_SITE_OTHER): Payer: BC Managed Care – PPO | Admitting: Nurse Practitioner

## 2021-11-04 ENCOUNTER — Encounter: Payer: Self-pay | Admitting: Physical Therapy

## 2021-11-04 ENCOUNTER — Encounter: Payer: Self-pay | Admitting: Nurse Practitioner

## 2021-11-04 VITALS — BP 118/70 | HR 91 | Temp 97.9°F | Resp 16 | Ht 63.0 in | Wt 172.4 lb

## 2021-11-04 DIAGNOSIS — G43009 Migraine without aura, not intractable, without status migrainosus: Secondary | ICD-10-CM

## 2021-11-04 DIAGNOSIS — Z79899 Other long term (current) drug therapy: Secondary | ICD-10-CM

## 2021-11-04 DIAGNOSIS — Z1389 Encounter for screening for other disorder: Secondary | ICD-10-CM

## 2021-11-04 DIAGNOSIS — Z13220 Encounter for screening for lipoid disorders: Secondary | ICD-10-CM

## 2021-11-04 DIAGNOSIS — M6281 Muscle weakness (generalized): Secondary | ICD-10-CM | POA: Diagnosis not present

## 2021-11-04 DIAGNOSIS — Z131 Encounter for screening for diabetes mellitus: Secondary | ICD-10-CM | POA: Diagnosis not present

## 2021-11-04 DIAGNOSIS — E782 Mixed hyperlipidemia: Secondary | ICD-10-CM

## 2021-11-04 DIAGNOSIS — M5459 Other low back pain: Secondary | ICD-10-CM

## 2021-11-04 DIAGNOSIS — E559 Vitamin D deficiency, unspecified: Secondary | ICD-10-CM

## 2021-11-04 DIAGNOSIS — Z1329 Encounter for screening for other suspected endocrine disorder: Secondary | ICD-10-CM

## 2021-11-04 DIAGNOSIS — R7309 Other abnormal glucose: Secondary | ICD-10-CM

## 2021-11-04 DIAGNOSIS — Z0000 Encounter for general adult medical examination without abnormal findings: Secondary | ICD-10-CM

## 2021-11-04 DIAGNOSIS — E6609 Other obesity due to excess calories: Secondary | ICD-10-CM

## 2021-11-04 DIAGNOSIS — J309 Allergic rhinitis, unspecified: Secondary | ICD-10-CM

## 2021-11-04 DIAGNOSIS — Z0001 Encounter for general adult medical examination with abnormal findings: Secondary | ICD-10-CM

## 2021-11-04 NOTE — Therapy (Addendum)
OUTPATIENT PHYSICAL THERAPY TREATMENT NOTE Discharge   Patient Name: Beth Thomas MRN: [id number] DOB:Sep 02, 1995, 26 y.o., female Today's Date: 11/04/2021    END OF SESSION:   PT End of Session - 11/04/21 1328     Visit Number 3    Number of Visits 6    Date for PT Re-Evaluation 11/28/21    Authorization Type Pt has a $52 co pay    PT Start Time 1304    PT Stop Time 1344    PT Time Calculation (min) 40 min    Activity Tolerance Patient tolerated treatment well    Behavior During Therapy WFL for tasks assessed/performed              Past Medical History:  Diagnosis Date   Allergy    Unspecified vitamin D deficiency    Past Surgical History:  Procedure Laterality Date   TONSILECTOMY/ADENOIDECTOMY WITH MYRINGOTOMY  01/2016   Patient Active Problem List   Diagnosis Date Noted   Class 1 obesity due to excess calories without serious comorbidity with body mass index (BMI) of 34.0 to 34.9 in adult 09/30/2020   Mixed hyperlipidemia 06/27/2019   Anxiety 06/22/2017   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   Vitamin D deficiency 05/14/2015   Other abnormal glucose 05/14/2015   Allergic rhinitis 01/26/2013     THERAPY DIAG:  Other low back pain  Muscle weakness (generalized)  PCP: Unk Pinto MD   REFERRING PROVIDER: Mcarthur Rossetti MD   REFERRING DIAG:  M54.50 (ICD-10-CM) - Acute left-sided low back pain without sciatica   Rationale for Evaluation and Treatment Rehabilitation   ONSET DATE: about 1 month ago   SUBJECTIVE:                                                                                                                                                                                            SUBJECTIVE STATEMENT: Pt stating 1/10 pain in her low back more on left. Pt stating the DN seemed to help following last visit.    PERTINENT HISTORY:  allergies, vit D deficiency   PAIN:  Are you having pain? Yes: NPRS scale: 1/10 Pain location:  low back more on left Pain description: achy, Aggravating factors: sitting with legs crossed Relieving factors: muscle relaxer, prednisone      PRECAUTIONS: Other: scoliosis lumbar spine for DN   WEIGHT BEARING RESTRICTIONS No   FALLS:  Has patient fallen in last 6 months? No   LIVING ENVIRONMENT: Lives with: lives with their family Lives in: House/apartment Stairs: Yes: External: 9 steps; on right going up Has following equipment at home: None  OCCUPATION: elementary school teacher   PLOF: Independent   PATIENT GOALS   Stop hurting, be able to go back to work without pain     OBJECTIVE:    DIAGNOSTIC FINDINGS:  2 views of the lumbar spine are seen today and compared to films of the  lumbar spine from about 4 years ago.  There is a lumbar scoliosis that is  unchanged from previous films.  There is no acute findings.   PATIENT SURVEYS:  FOTO 70% 10/13/21, Predicted 78%   SCREENING FOR RED FLAGS: Bowel or bladder incontinence: No Spinal tumors: No Cauda equina syndrome: No Compression fracture: No Abdominal aneurysm: No   COGNITION:           Overall cognitive status: Within functional limits for tasks assessed                          SENSATION: WFL   MUSCLE LENGTH: 10/13/21: Hamstrings: Right 100 deg; Left 102 deg   POSTURE: No Significant postural limitations   PALPATION: TTP: left QL, left paraspinals and left piriformis   LUMBAR ROM:    Active  AROM  eval 10/27/21  Flexion   WNL  Extension 15 WNL but pain  Right lateral flexion   WNL  Left lateral flexion   WNL but pain   Right rotation WNL WNL but pain  Left rotation WNL WNL but pain   (Blank rows = not tested)   LOWER EXTREMITY ROM:      Active  Right eval Left eval  Hip flexion 126 120   Hip extension 15 15  Hip abduction      Hip adduction       (Blank rows = not tested)   LOWER EXTREMITY MMT:     MMT Right eval Left eval  Hip flexion 5/5 4/5  Hip extension 5/5 4/5  Hip  abduction 5/5 5/5  Hip adduction 5/5 5/5  Hip internal rotation      Hip external rotation      Knee flexion 5/5 4/5  Knee extension 5/5 5/5   (Blank rows = not tested)   LUMBAR SPECIAL TESTS:  Straight leg raise test: Negative and Slump test: Negative   FUNCTIONAL TESTS:  10/13/21: 5 times sit to stand: 12 seconds no UE support   GAIT: Distance walked: 30 feet  Assistive device utilized: None Level of assistance: Complete Independence Comments: normalized step through gait pattern       TODAY'S TREATMENT  11/04/21:  Therex Nustep: Level 2 x 6 minutes Leg Press: 75# 2 x 15  Prone on elbows stretch 10 sec X 5 Cat/Camel: x 5 holding 10 seconds Quadriped alt arm/opposite leg extensions X 10 each holding 5 sec Prone: supermans x 10 holding 5 sec Bridges: lower legs on red physioball x 10 holding 5 sec Trunk rotations: x 2 each side holding 30 seconds Supine DKTC stretch 30 sec X 2 both sides TRX squats 2 x 10 Golfer's lift with 10 # kettle bell x 10 each LE  10/27/21 Manual therapy for skilled palpation and active compression with Trigger Point Dry-Needling  Treatment instructions: Expect mild to moderate muscle soreness. S/S of pneumothorax if dry needled over a lung field, and to seek immediate medical attention should they occur. Patient verbalized understanding of these instructions and education.  Patient Consent Given: Yes Education handout provided: Yes Muscles treated: lumbar P.S/multifidi, and Glute/piriformis on left Treatment response/outcome: twitch response noted   Used  Estim mico current at 100 frequency and milli amp current at 2 frequency 3 min each X 4 -Therex  -Prone on elbows stretch 10 sec X 5  -Quadriped alt arm extensions X 10 each holding 5 sec  -Quadriped alt leg extensions X 10 each holding 5 sec  -Supine LTR 10 sec X 5 bilat  -Supine SKTC stretch 30 sec X 2 both sides  -Supine bridges 5 sec X 15  -Standing wall slides into shoulder flexion then  extending off wall holding thoracic extension 5 sec X10   10/13/21 HEP instruction/performance c cues for techniques, handout provided.  Trial set performed of each for comprehension and symptom assessment.  See below for exercise list.     PATIENT EDUCATION:  Education details: PT POC, HEP, STM using tennis ball for low back. Gluteals, and piriformis Person educated: Patient Education method: Explanation, Demonstration, Tactile cues, Verbal cues, and Handouts Education comprehension: verbalized understanding and returned demonstration     HOME EXERCISE PROGRAM: Access Code: [email or web address] URL: [email or web address] Date: 10/13/2021 Prepared by: Kearney Hard   Exercises - Supine Lower Trunk Rotation  - 2-3 x daily - 7 x weekly - 3 reps - 30 seconds hold - Supine Single Knee to Chest Stretch  - 2-3 x daily - 7 x weekly - 3 reps - 30 seocnds hold - Supine Piriformis Stretch  - 2-3 x daily - 7 x weekly - 3 reps - 30 seconds hold - Hooklying Hamstring Stretch with Strap  - 2-3 x daily - 7 x weekly - 3 reps - 30 seconds hold - Standing Thoracic Extension at Wall  - 2-3 x daily - 7 x weekly - 5 reps - 10 seconds hold - Standing Shoulder Row with Anchored Resistance  - 2-3 x daily - 7 x weekly - 3 sets - 10 reps - 3 seconds hold   ASSESSMENT:   CLINICAL IMPRESSION: Pt stating the TPDN seemed to help and feels she doesn't need it again this session. Pt reporting 1/10 pain in her lower left back. Pt tolerating exercises well focusing on core strengthening. Continue skilled PT to maximize pt's function.       OBJECTIVE IMPAIRMENTS difficulty walking, decreased ROM, decreased strength, impaired flexibility, and pain.    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    REHAB POTENTIAL:  Excellent   CLINICAL DECISION MAKING: Stable/uncomplicated   EVALUATION COMPLEXITY: Low     GOALS: Goals reviewed with patient? Yes   SHORT TERM GOALS: Target date: 11/03/2021   Patient will demonstrate independent use of initial home exercise program to maintain progress from in clinic treatments. Goal status: MET 11/04/21   Long term PT goals  target dates 11/28/21 Patient will demonstrate/report pain at worst less than or equal to 2/10 to facilitate minimal limitation in daily activity secondary to pain symptoms. Goal status: New   Patient will demonstrate independent use of home exercise program to facilitate ability to maintain/progress functional gains from skilled physical therapy services. Goal status: New   Patient will demonstrate FOTO outcome > or = 78% to indicate reduced disability due to condition. Goal status: New   Pt will be able to get up and down from floor after sitting criss cross with no low back pain.  Goal status: New       5.  Pt  will improve her Left LE strength to grossly 5/5 for improved functional mobility.   Goal status: New     PLAN: PT FREQUENCY: 1x/week   PT DURATION: 6 weeks   PLANNED INTERVENTIONS: Therapeutic exercises, Therapeutic activity, Neuromuscular re-education, Balance training, Gait training, Patient/Family education, Self Care, Joint mobilization, Stair training, Dry Needling, Electrical stimulation, Cryotherapy, Moist heat, Taping, Traction, Ultrasound, and Manual therapy.   PLAN FOR NEXT SESSION: Continue low back strengthening, stretching, prone progression.    Oretha Caprice, PT, MPT 11/04/2021, 1:33 PM   PHYSICAL THERAPY DISCHARGE SUMMARY  Visits from Start of Care: 3  Current functional level related to goals / functional outcomes: See above   Remaining deficits: See above   Education / Equipment: HEP   Patient agrees to discharge. Patient goals were not met. Patient is being discharged due to not returning  since the last visit.

## 2021-11-05 ENCOUNTER — Other Ambulatory Visit: Payer: Self-pay | Admitting: Nurse Practitioner

## 2021-11-05 ENCOUNTER — Encounter: Payer: BC Managed Care – PPO | Admitting: Nurse Practitioner

## 2021-11-05 DIAGNOSIS — E782 Mixed hyperlipidemia: Secondary | ICD-10-CM

## 2021-11-05 DIAGNOSIS — R8290 Unspecified abnormal findings in urine: Secondary | ICD-10-CM

## 2021-11-05 LAB — HEMOGLOBIN A1C
Hgb A1c MFr Bld: 5.1 % of total Hgb (ref <5.7)
Mean Plasma Glucose: 100 mg/dL
eAG (mmol/L): 5.5 mmol/L

## 2021-11-05 LAB — LIPID PANEL
Cholesterol: 219 mg/dL — ABNORMAL HIGH (ref <200)
HDL: 61 mg/dL (ref >=50)
LDL Cholesterol (Calc): 132 mg/dL (calc) — ABNORMAL HIGH
Non-HDL Cholesterol (Calc): 158 mg/dL (calc) — ABNORMAL HIGH (ref <130)
Total CHOL/HDL Ratio: 3.6 (calc) (ref <5.0)
Triglycerides: 148 mg/dL (ref <150)

## 2021-11-05 LAB — COMPLETE METABOLIC PANEL WITH GFR
AG Ratio: 1.4 (calc) (ref 1.0–2.5)
ALT: 10 U/L (ref 6–29)
AST: 15 U/L (ref 10–30)
Albumin: 4.2 g/dL (ref 3.6–5.1)
Alkaline phosphatase (APISO): 46 U/L (ref 31–125)
BUN: 13 mg/dL (ref 7–25)
CO2: 24 mmol/L (ref 20–32)
Calcium: 9.6 mg/dL (ref 8.6–10.2)
Chloride: 104 mmol/L (ref 98–110)
Creat: 0.71 mg/dL (ref 0.50–0.96)
Globulin: 3.1 g/dL (calc) (ref 1.9–3.7)
Glucose, Bld: 71 mg/dL (ref 65–99)
Potassium: 4.1 mmol/L (ref 3.5–5.3)
Sodium: 138 mmol/L (ref 135–146)
Total Bilirubin: 0.2 mg/dL (ref 0.2–1.2)
Total Protein: 7.3 g/dL (ref 6.1–8.1)
eGFR: 119 mL/min/{1.73_m2} (ref >=60)

## 2021-11-05 LAB — URINALYSIS, ROUTINE W REFLEX MICROSCOPIC
Bilirubin Urine: NEGATIVE
Glucose, UA: NEGATIVE
Hgb urine dipstick: NEGATIVE
Hyaline Cast: NONE SEEN /LPF
Ketones, ur: NEGATIVE
Leukocytes,Ua: NEGATIVE
Nitrite: POSITIVE — AB
Protein, ur: NEGATIVE
RBC / HPF: NONE SEEN /HPF (ref 0–2)
Specific Gravity, Urine: 1.021 (ref 1.001–1.035)
WBC, UA: NONE SEEN /HPF (ref 0–5)
pH: 7.5 (ref 5.0–8.0)

## 2021-11-05 LAB — CBC WITH DIFFERENTIAL/PLATELET
Absolute Monocytes: 628 cells/uL (ref 200–950)
Basophils Absolute: 43 cells/uL (ref 0–200)
Basophils Relative: 0.5 %
Eosinophils Absolute: 241 cells/uL (ref 15–500)
Eosinophils Relative: 2.8 %
HCT: 37.1 % (ref 35.0–45.0)
Hemoglobin: 12.6 g/dL (ref 11.7–15.5)
Lymphs Abs: 2752 cells/uL (ref 850–3900)
MCH: 29.9 pg (ref 27.0–33.0)
MCHC: 34 g/dL (ref 32.0–36.0)
MCV: 87.9 fL (ref 80.0–100.0)
MPV: 11.4 fL (ref 7.5–12.5)
Monocytes Relative: 7.3 %
Neutro Abs: 4936 cells/uL (ref 1500–7800)
Neutrophils Relative %: 57.4 %
Platelets: 302 10*3/uL (ref 140–400)
RBC: 4.22 10*6/uL (ref 3.80–5.10)
RDW: 12.2 % (ref 11.0–15.0)
Total Lymphocyte: 32 %
WBC: 8.6 10*3/uL (ref 3.8–10.8)

## 2021-11-05 LAB — TSH: TSH: 2.1 mIU/L

## 2021-11-05 LAB — VITAMIN D 25 HYDROXY (VIT D DEFICIENCY, FRACTURES): Vit D, 25-Hydroxy: 37 ng/mL (ref 30–100)

## 2021-11-05 LAB — MAGNESIUM: Magnesium: 2.1 mg/dL (ref 1.5–2.5)

## 2021-11-05 MED ORDER — ROSUVASTATIN CALCIUM 10 MG PO TABS: 10.0000 mg | ORAL_TABLET | Freq: Every day | ORAL | 11 refills | Status: DC

## 2021-11-08 LAB — URINE CULTURE
MICRO NUMBER:: 13820903
SPECIMEN QUALITY:: ADEQUATE

## 2021-11-10 ENCOUNTER — Encounter: Payer: BC Managed Care – PPO | Admitting: Physical Therapy

## 2021-11-13 ENCOUNTER — Encounter: Payer: Self-pay | Admitting: Orthopaedic Surgery

## 2021-11-13 ENCOUNTER — Ambulatory Visit: Payer: BC Managed Care – PPO | Admitting: Orthopaedic Surgery

## 2021-11-13 DIAGNOSIS — M545 Low back pain, unspecified: Secondary | ICD-10-CM

## 2021-11-13 NOTE — Progress Notes (Signed)
Beth Thomas comes in today feeling much better from her left-sided low back pain.  She has been through physical therapy and some of the hands-on therapy did help her but her home exercise program is helped her as well as time.  She still has some pain in the lower aspect of her lumbar spine to the left side and it does seem to be facet mediated.  She feels like she is doing better and has no concerns and is not interested in any other intervention such as facet joint injections and she was doing better.  She does look better overall.  There is still some pain in lower aspect of her lumbar spine to the left side but overall she is tolerating it well and looks good.  Follow-up can be as needed.  If she does feel like she is getting worse and would like to consider facet joint injection to the left side she will let us know.

## 2021-11-18 ENCOUNTER — Encounter: Payer: BC Managed Care – PPO | Admitting: Physical Therapy

## 2021-11-24 ENCOUNTER — Telehealth: Payer: Self-pay

## 2021-11-24 NOTE — Telephone Encounter (Signed)
Prior auth for Phentermine completed and submitted.   Prior auth denied.

## 2021-12-01 ENCOUNTER — Encounter: Payer: BC Managed Care – PPO | Admitting: Physical Therapy

## 2021-12-08 ENCOUNTER — Encounter: Payer: BC Managed Care – PPO | Admitting: Physical Therapy

## 2021-12-08 ENCOUNTER — Telehealth: Payer: Self-pay | Admitting: Physical Therapy

## 2021-12-08 NOTE — Telephone Encounter (Signed)
I called pt to follow up after she didn't show for her 1515 appointment today. Pt stating she meant to call and cancel her appointment this morning and she just forgot. Pt sating she would call back to reschedule.  Kearney Hard, PT, MPT 12/08/21 4:07 PM

## 2021-12-25 ENCOUNTER — Encounter: Payer: Self-pay | Admitting: Nurse Practitioner

## 2021-12-25 ENCOUNTER — Ambulatory Visit (INDEPENDENT_AMBULATORY_CARE_PROVIDER_SITE_OTHER): Payer: BC Managed Care – PPO | Admitting: Nurse Practitioner

## 2021-12-25 ENCOUNTER — Other Ambulatory Visit: Payer: Self-pay

## 2021-12-25 VITALS — BP 128/72 | HR 119 | Temp 97.9°F | Ht 63.0 in | Wt 176.0 lb

## 2021-12-25 DIAGNOSIS — R0981 Nasal congestion: Secondary | ICD-10-CM

## 2021-12-25 DIAGNOSIS — R5383 Other fatigue: Secondary | ICD-10-CM

## 2021-12-25 DIAGNOSIS — Z1152 Encounter for screening for COVID-19: Secondary | ICD-10-CM | POA: Diagnosis not present

## 2021-12-25 DIAGNOSIS — J020 Streptococcal pharyngitis: Secondary | ICD-10-CM | POA: Diagnosis not present

## 2021-12-25 LAB — POC COVID19 BINAXNOW: SARS Coronavirus 2 Ag: NEGATIVE

## 2021-12-25 MED ORDER — PENICILLIN V POTASSIUM 500 MG PO TABS: 500.0000 mg | ORAL_TABLET | Freq: Three times a day (TID) | ORAL | 0 refills | Status: DC

## 2021-12-25 NOTE — Progress Notes (Signed)
Assessment and Plan:  Beth Thomas was seen today for an episodic visit.  Diagnoses and all order for this visit:  1. Encounter for screening for COVID-19 Negative  - POC COVID-19  2. Strep pharyngitis Positive Warm salt water gargles several times throughout the day  - penicillin v potassium (VEETID) 500 MG tablet; Take 1 tablet (500 mg total) by mouth 3 (three) times daily.  Dispense: 30 tablet; Refill: 0  3. Nasal congestion Push fluids  4. Other fatigue Rest  Notify office for further evaluation and treatment, questions or concerns if s/s fail to improve. The risks and benefits of my recommendations, as well as other treatment options were discussed with the patient today. Questions were answered.  Further disposition pending results of labs. Discussed med's effects and SE's.    Over 15 minutes of exam, counseling, chart review, and critical decision making was performed.   Future Appointments  Date Time Provider [place]  05/07/2022  3:30 PM Alycia Rossetti, NP [facility] None  11/05/2022  9:00 AM Alycia Rossetti, NP [facility] None    ------------------------------------------------------------------------------------------------------------------   HPI BP 128/72   Pulse (!) 119   Temp 97.9 F (36.6 C)   Ht 5\' 3"  (1.6 m)   Wt 176 lb (79.8 kg)   SpO2 98%   BMI 31.18 kg/m    Patient complains of sore throat. Associated symptoms include post nasal drip, sinus and nasal congestion, and sore throat. Onset of symptoms was 3 days ago, and have been unchanged since that time. She is drinking plenty of fluids. She has had recent close exposure to someone with proven streptococcal pharyngitis.  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  Past Medical History:  Diagnosis Date   Allergy    Unspecified vitamin D deficiency      No Known Allergies  Current Outpatient Medications on File Prior to Visit  Medication Sig    Cetirizine HCl 10 MG CAPS Take 10 mg by mouth daily.   citalopram (CELEXA) 20 MG tablet TAKE 1 TABLET BY MOUTH DAILY FOR MOOD   Cyanocobalamin (B-12 PO) Take by mouth.   drospirenone-ethinyl estradiol (SYEDA) 3-0.03 MG tablet Take 1 tablet Daily   Ferrous Sulfate (IRON PO) Take by mouth daily.   fluticasone (FLONASE) 50 MCG/ACT nasal spray Place 1 spray into both nostrils daily.   ibuprofen (ADVIL) 200 MG tablet Take 200 mg by mouth every 6 (six) hours as needed. Takes 4 tablets prn   Insulin Pen Needle (NOVOFINE PLUS PEN NEEDLE) 32G X 4 MM MISC Use new needle daily on prefilled pen syringe   phentermine (ADIPEX-P) 37.5 MG tablet Take 1/2 to 1 tablet every morning for dieting & weightloss   predniSONE (DELTASONE) 50 MG tablet Take one tablet daily for 5 days.   rosuvastatin (CRESTOR) 10 MG tablet Take 1 tablet (10 mg total) by mouth daily.   Vitamin D, Cholecalciferol, 25 MCG (1000 UT) TABS Take by mouth.   Liraglutide -Weight Management (SAXENDA) 18 MG/3ML SOPN Inject 3 mg into the skin daily. Inject one pen SQ daily for weight loss (Patient not taking: Reported on 12/25/2021)   methocarbamol (ROBAXIN) 750 MG tablet Take 1 tablet (750 mg total) by mouth every 8 (eight) hours as needed for muscle spasms. (Patient not taking: Reported on 12/25/2021)   No current facility-administered medications on file prior to visit.    ROS: all negative except what is noted in the HPI.   Physical Exam:  BP 128/72  Pulse (!) 119   Temp 97.9 F (36.6 C)   Ht 5\' 3"  (1.6 m)   Wt 176 lb (79.8 kg)   SpO2 98%   BMI 31.18 kg/m   General Appearance: NAD.  Awake, conversant and cooperative. Eyes: PERRLA, EOMs intact.  Sclera white.  Conjunctiva without erythema. Sinuses: No frontal/maxillary tenderness.  No nasal discharge. Nares patent.  ENT/Mouth: Ext aud canals clear.  Bilateral TMs w/DOL and without erythema or bulging. Hearing intact.  Posterior pharynx with  moderate erythema.   Neck: Supple.  No  masses, nodules or thyromegaly. Respiratory: Effort is regular with non-labored breathing. Breath sounds are equal bilaterally without rales, rhonchi, wheezing or stridor.  Cardio: RRR with no MRGs. Brisk peripheral pulses without edema.  Abdomen: Active BS in all four quadrants.  Soft and non-tender without guarding, rebound tenderness, hernias or masses. Lymphatics: Non tender without lymphadenopathy.  Musculoskeletal: Full ROM, 5/5 strength, normal ambulation.  No clubbing or cyanosis. Skin: Appropriate color for ethnicity. Warm without rashes, lesions, ecchymosis, ulcers.  Neuro: CN II-XII grossly normal. Normal muscle tone without cerebellar symptoms and intact sensation.   Psych: AO X 3,  appropriate mood and affect, insight and judgment.     , NP 3:39 PM [facility]

## 2022-01-05 ENCOUNTER — Other Ambulatory Visit: Payer: Self-pay

## 2022-01-05 DIAGNOSIS — Z30011 Encounter for initial prescription of contraceptive pills: Secondary | ICD-10-CM

## 2022-01-05 MED ORDER — DROSPIRENONE-ETHINYL ESTRADIOL 3-0.03 MG PO TABS: ORAL_TABLET | ORAL | 3 refills | Status: DC

## 2022-01-15 ENCOUNTER — Encounter: Payer: Self-pay | Admitting: Nurse Practitioner

## 2022-02-01 ENCOUNTER — Emergency Department (HOSPITAL_COMMUNITY): Payer: BC Managed Care – PPO

## 2022-02-01 ENCOUNTER — Emergency Department (HOSPITAL_COMMUNITY)
Admission: EM | Admit: 2022-02-01 | Discharge: 2022-02-01 | Disposition: A | Discharge status: Home / Self Care | Payer: BC Managed Care – PPO | Attending: Emergency Medicine | Admitting: Emergency Medicine

## 2022-02-01 ENCOUNTER — Other Ambulatory Visit: Payer: Self-pay

## 2022-02-01 DIAGNOSIS — Z794 Long term (current) use of insulin: Secondary | ICD-10-CM | POA: Insufficient documentation

## 2022-02-01 DIAGNOSIS — K8020 Calculus of gallbladder without cholecystitis without obstruction: Secondary | ICD-10-CM

## 2022-02-01 DIAGNOSIS — N9489 Other specified conditions associated with female genital organs and menstrual cycle: Secondary | ICD-10-CM | POA: Diagnosis not present

## 2022-02-01 DIAGNOSIS — R1010 Upper abdominal pain, unspecified: Secondary | ICD-10-CM | POA: Diagnosis present

## 2022-02-01 LAB — COMPREHENSIVE METABOLIC PANEL
ALT: 51 U/L — ABNORMAL HIGH (ref 0–44)
AST: 113 U/L — ABNORMAL HIGH (ref 15–41)
Albumin: 3.5 g/dL (ref 3.5–5.0)
Alkaline Phosphatase: 43 U/L (ref 38–126)
Anion gap: 10 (ref 5–15)
BUN: 8 mg/dL (ref 6–20)
CO2: 24 mmol/L (ref 22–32)
Calcium: 9.3 mg/dL (ref 8.9–10.3)
Chloride: 102 mmol/L (ref 98–111)
Creatinine, Ser: 0.79 mg/dL (ref 0.44–1.00)
GFR, Estimated: >60 mL/min (ref >60)
Glucose, Bld: 131 mg/dL — ABNORMAL HIGH (ref 70–99)
Potassium: 3.9 mmol/L (ref 3.5–5.1)
Sodium: 136 mmol/L (ref 135–145)
Total Bilirubin: 0.7 mg/dL (ref 0.3–1.2)
Total Protein: 7.2 g/dL (ref 6.5–8.1)

## 2022-02-01 LAB — CBC WITH DIFFERENTIAL/PLATELET
Abs Immature Granulocytes: 0.06 10*3/uL (ref 0.00–0.07)
Basophils Absolute: 0.1 10*3/uL (ref 0.0–0.1)
Basophils Relative: 0 %
Eosinophils Absolute: 0.1 10*3/uL (ref 0.0–0.5)
Eosinophils Relative: 1 %
HCT: 39.6 % (ref 36.0–46.0)
Hemoglobin: 13 g/dL (ref 12.0–15.0)
Immature Granulocytes: 0 %
Lymphocytes Relative: 16 %
Lymphs Abs: 2.5 10*3/uL (ref 0.7–4.0)
MCH: 29.7 pg (ref 26.0–34.0)
MCHC: 32.8 g/dL (ref 30.0–36.0)
MCV: 90.6 fL (ref 80.0–100.0)
Monocytes Absolute: 0.8 10*3/uL (ref 0.1–1.0)
Monocytes Relative: 5 %
Neutro Abs: 12.1 10*3/uL — ABNORMAL HIGH (ref 1.7–7.7)
Neutrophils Relative %: 78 %
Platelets: 272 10*3/uL (ref 150–400)
RBC: 4.37 MIL/uL (ref 3.87–5.11)
RDW: 13.1 % (ref 11.5–15.5)
WBC: 15.6 10*3/uL — ABNORMAL HIGH (ref 4.0–10.5)
nRBC: 0 % (ref 0.0–0.2)

## 2022-02-01 LAB — URINALYSIS, ROUTINE W REFLEX MICROSCOPIC
Bilirubin Urine: NEGATIVE
Glucose, UA: NEGATIVE mg/dL
Hgb urine dipstick: NEGATIVE
Ketones, ur: NEGATIVE mg/dL
Nitrite: NEGATIVE
Protein, ur: NEGATIVE mg/dL
Specific Gravity, Urine: 1.016 (ref 1.005–1.030)
pH: 7 (ref 5.0–8.0)

## 2022-02-01 LAB — LIPASE, BLOOD: Lipase: 30 U/L (ref 11–51)

## 2022-02-01 MED ORDER — OXYCODONE-ACETAMINOPHEN 5-325 MG PO TABS
1.0000 | ORAL_TABLET | Freq: Once | ORAL | Status: AC
Start: 2022-02-01 — End: 2022-02-01
  Administered 2022-02-01: 1 via ORAL
  Filled 2022-02-01: qty 1

## 2022-02-01 MED ORDER — OXYCODONE-ACETAMINOPHEN 5-325 MG PO TABS: 1.0000 | ORAL_TABLET | Freq: Four times a day (QID) | ORAL | 0 refills | Status: DC | PRN

## 2022-02-01 MED ORDER — ONDANSETRON 4 MG PO TBDP
4.0000 mg | ORAL_TABLET | Freq: Once | ORAL | Status: AC
  Administered 2022-02-01: 4 mg via ORAL
  Filled 2022-02-01: qty 1

## 2022-02-01 MED ORDER — ONDANSETRON HCL 4 MG PO TABS: 4.0000 mg | ORAL_TABLET | Freq: Four times a day (QID) | ORAL | 0 refills | Status: DC

## 2022-02-01 NOTE — ED Provider Notes (Signed)
[facility] EMERGENCY DEPARTMENT Provider Note   CSN: [id number] Arrival date & time: 02/01/22  0315     History  Chief Complaint  Patient presents with   Abdominal Pain    Beth Thomas is a 26 y.o. female.  Patient is a healthy 26 year old female with a history of allergies who is presenting today due to upper abdominal pain.  Patient reports that she had chili last night and shortly after eating she started having significant pain in her upper abdomen most specifically her right upper quadrant.  She tried to take Pepto-Bismol but the pain did not improve and she did vomit.  Because the pain was not getting any better she came to the emergency room.  She had not had any fever, diarrhea or urinary symptoms.  Menses have been normal.  She does report 2 other episodes this month of having pain similar to this it was just not as severe and it always had gone away on its own.  Currently she is pain-free.  She denies any nausea.  No prior abdominal surgeries.  The history is provided by the patient.  Abdominal Pain      Home Medications Prior to Admission medications   Medication Sig Start Date End Date Taking? Authorizing Provider  ondansetron (ZOFRAN) 4 MG tablet Take 1 tablet (4 mg total) by mouth every 6 (six) hours. 02/01/22  Yes Raiden Haydu, Loree Fee, MD  oxyCODONE-acetaminophen (PERCOCET/ROXICET) 5-325 MG tablet Take 1 tablet by mouth every 6 (six) hours as needed for severe pain. 02/01/22  Yes Joslyn Ramos, Loree Fee, MD  Cetirizine HCl 10 MG CAPS Take 10 mg by mouth daily.    [provider]  citalopram (CELEXA) 20 MG tablet TAKE 1 TABLET BY MOUTH DAILY FOR MOOD 11/30/20   Liane Comber, NP  Cyanocobalamin (B-12 PO) Take by mouth.    [provider]  drospirenone-ethinyl estradiol (SYEDA) 3-0.03 MG tablet Take 1 tablet Daily 01/05/22   Darrol Jump, NP  Ferrous Sulfate (IRON PO) Take by mouth daily.    [provider]  fluticasone  (FLONASE) 50 MCG/ACT nasal spray Place 1 spray into both nostrils daily. 01/21/21 01/21/22  Liane Comber, NP  ibuprofen (ADVIL) 200 MG tablet Take 200 mg by mouth every 6 (six) hours as needed. Takes 4 tablets prn    [provider]  Insulin Pen Needle (NOVOFINE PLUS PEN NEEDLE) 32G X 4 MM MISC Use new needle daily on prefilled pen syringe 05/29/21   Darrol Jump, NP  Liraglutide -Weight Management (SAXENDA) 18 MG/3ML SOPN Inject 3 mg into the skin daily. Inject one pen SQ daily for weight loss Patient not taking: Reported on 12/25/2021 05/19/21   Liane Comber, NP  methocarbamol (ROBAXIN) 750 MG tablet Take 1 tablet (750 mg total) by mouth every 8 (eight) hours as needed for muscle spasms. Patient not taking: Reported on 12/25/2021 10/01/21   Mcarthur Rossetti, MD  penicillin v potassium (VEETID) 500 MG tablet Take 1 tablet (500 mg total) by mouth 3 (three) times daily. 12/25/21   Darrol Jump, NP  phentermine (ADIPEX-P) 37.5 MG tablet Take 1/2 to 1 tablet every morning for dieting & weightloss 07/16/21   Alycia Rossetti, NP  predniSONE (DELTASONE) 50 MG tablet Take one tablet daily for 5 days. 10/01/21   Mcarthur Rossetti, MD  rosuvastatin (CRESTOR) 10 MG tablet Take 1 tablet (10 mg total) by mouth daily. 11/05/21 11/05/22  Alycia Rossetti, NP  Vitamin D, Cholecalciferol, 25 MCG (1000 UT) TABS Take  by mouth.    [provider]      Allergies    Patient has no known allergies.    Review of Systems   Review of Systems  Gastrointestinal:  Positive for abdominal pain.    Physical Exam Updated Vital Signs BP 113/73 (BP Location: Right Arm)   Pulse (!) 56   Temp 98.1 F (36.7 C)   Resp 16   LMP 01/18/2022   SpO2 100%  Physical Exam Vitals and nursing note reviewed.  Constitutional:      General: She is not in acute distress.    Appearance: She is well-developed.  HENT:     Head: Normocephalic and atraumatic.  Eyes:     Pupils: Pupils are equal,  round, and reactive to light.  Cardiovascular:     Rate and Rhythm: Normal rate and regular rhythm.     Heart sounds: Normal heart sounds. No murmur heard.    No friction rub.  Pulmonary:     Effort: Pulmonary effort is normal.     Breath sounds: Normal breath sounds. No wheezing or rales.  Abdominal:     General: Bowel sounds are normal. There is no distension.     Palpations: Abdomen is soft.     Tenderness: There is no abdominal tenderness. There is no right CVA tenderness, left CVA tenderness, guarding or rebound.  Musculoskeletal:        General: No tenderness. Normal range of motion.     Comments: No edema  Skin:    General: Skin is warm and dry.     Findings: No rash.  Neurological:     Mental Status: She is alert and oriented to person, place, and time.     Cranial Nerves: No cranial nerve deficit.  Psychiatric:        Behavior: Behavior normal.     ED Results / Procedures / Treatments   Labs (all labs ordered are listed, but only abnormal results are displayed) Labs Reviewed  COMPREHENSIVE METABOLIC PANEL - Abnormal; Notable for the following components:      Result Value   Glucose, Bld 131 (*)    AST 113 (*)    ALT 51 (*)    All other components within normal limits  CBC WITH DIFFERENTIAL/PLATELET - Abnormal; Notable for the following components:   WBC 15.6 (*)    Neutro Abs 12.1 (*)    All other components within normal limits  URINALYSIS, ROUTINE W REFLEX MICROSCOPIC - Abnormal; Notable for the following components:   APPearance CLOUDY (*)    Leukocytes,Ua TRACE (*)    Bacteria, UA RARE (*)    All other components within normal limits  LIPASE, BLOOD  I-STAT BETA HCG BLOOD, ED ([facility], [facility], [facility] ONLY)    EKG None  Radiology US Abdomen Limited RUQ (LIVER/GB)  Result Date: 02/01/2022 CLINICAL DATA:  Elevated hepatic transaminases. EXAM: ULTRASOUND ABDOMEN LIMITED RIGHT UPPER QUADRANT COMPARISON:  None Available. FINDINGS: Gallbladder: There are multiple  layering stones in the gallbladder up to 1.5 cm. There is a 7.4 mm echogenic polyp of the superior wall near the neck and mild sludge. There is no wall thickening, sonographic Murphy's sign or pericholecystic fluid. Common bile duct: Diameter: 6.8 mm, which is prominent especially considering age, but no intrahepatic biliary prominence is seen. Liver: No focal lesion identified. Within normal limits in parenchymal echogenicity. Portal vein is patent on color Doppler imaging with normal direction of blood flow towards the liver. Other: None. IMPRESSION: 1. Cholelithiasis without  evidence of cholecystitis. 2. Gallbladder polyp measuring 7.4 mm. Recommend follow-up ultrasound in 6 months. 3. Prominent common bile duct considering age. No intrahepatic biliary prominence is seen. Recommend correlation with liver function tests. If there is concern for biliary obstruction, MRCP could be considered. 4. Management and follow-up of gallbladder polyps: 5. Joint guidelines between the European Society of Gastrointestinal and Abdominal Radiology (), European Association for Endoscopic Surgery and other Interventional Techniques (ACES), International Society of Digestive Surgery - European () and European Society of Gastrointestinal Endoscopy (), International Society of Digestive Surgery - European () and European Society of Gastrointestinal Endoscopy (). Electronically Signed   By: Telford Nab M.D.   On: 02/01/2022 06:00    Procedures Procedures    Medications Ordered in ED Medications  ondansetron (ZOFRAN-ODT) disintegrating tablet 4 mg (4 mg Oral Given 02/01/22 0341)  oxyCODONE-acetaminophen (PERCOCET/ROXICET) 5-325 MG per tablet 1 tablet (1 tablet Oral Given 02/01/22 0341)    ED Course/ Medical Decision Making/ A&P                           Medical Decision Making Amount and/or Complexity of Data Reviewed Labs: ordered. Decision-making details documented in ED Course. Radiology: ordered and independent  interpretation performed. Decision-making details documented in ED Course.  Risk Prescription drug management.   Pt presenting today with a complaint that caries a high risk for morbidity and mortality.  Here today complaining of right upper quadrant abdominal pain and vomiting.  Concern for hepatitis, pancreatitis, gastritis, cholecystitis.  Low suspicion for perforation at this time.  I independently interpreted patient's labs today and CBC with a leukocytosis of 15, normal hemoglobin, CMP with mild elevated LFTs with AST of 113, ALT of 51 but no normal total bilirubin, lipase is within normal limits.  I have independently visualized and interpreted pt's images today.  Right upper quadrant ultrasound showing gallstones today.  Radiology reports cholelithiasis without evidence of cholecystitis, gallbladder polyp measuring 7.4 mm and a prominent common bile duct considering age but no intrahepatic biliary prominence is seen.  Patient is now currently pain-free.  Low suspicion for biliary obstruction at this time.  Patient was given return precautions.  Outpatient follow-up with general surgery.  Findings discussed with the patient and her family member.  They are comfortable with this plan.          Final Clinical Impression(s) / ED Diagnoses Final diagnoses:  Calculus of gallbladder without cholecystitis without obstruction    Rx / DC Orders ED Discharge Orders          Ordered    oxyCODONE-acetaminophen (PERCOCET/ROXICET) 5-325 MG tablet  Every 6 hours PRN        02/01/22 1118    ondansetron (ZOFRAN) 4 MG tablet  Every 6 hours        02/01/22 1118              Blanchie Dessert, MD 02/01/22 1124

## 2022-02-01 NOTE — ED Notes (Signed)
Patient states pain was in the mid abdomen below the sternum. Patient said she vomited one time.

## 2022-02-01 NOTE — ED Triage Notes (Signed)
Upper abdominal pain, vomiting tonight. Has taken pepto bismol without relief.

## 2022-02-01 NOTE — ED Provider Triage Note (Signed)
  Emergency Medicine Provider Triage Evaluation Note  MRN:  [id number]  Arrival date & time: 02/01/22    Medically screening exam initiated at 3:29 AM.   CC:   Abdominal pain  HPI:  Beth Thomas is a 26 y.o. year-old female presents to the ED with chief complaint of epigastric abdominal pain.  Onset 10:30 pm.  Associated vomiting.  Tried pepto without relief.  .  History provided by patient. ROS:  -As included in HPI PE:   Vitals:   02/01/22 0321  BP: (!) 136/92  Pulse: 69  Resp: 16  Temp: 97.6 F (36.4 C)  SpO2: 96%    Non-toxic appearing No respiratory distress RUQ and epigastric tenderness MDM:  Based on signs and symptoms, gallbladder disease is highest on my differential, followed by PUD, gastro, or other. I've ordered labs and imaging in triage to expedite lab/diagnostic workup.  Patient was informed that the remainder of the evaluation will be completed by another provider, this initial triage assessment does not replace that evaluation, and the importance of remaining in the ED until their evaluation is complete.    Roxy Horseman, PA-C 02/01/22 [phone]

## 2022-02-01 NOTE — Discharge Instructions (Addendum)
Avoid fatty foods, things that are fried or have a high cream.  Also avoid raw foods.  Grilled meats and steamed vegetables.  If you start having pain try taking the medication you were prescribed but if the pain gets worse, you start vomiting uncontrollably or running fever you should return to the emergency room.

## 2022-02-02 ENCOUNTER — Other Ambulatory Visit: Payer: Self-pay

## 2022-02-02 ENCOUNTER — Observation Stay (HOSPITAL_COMMUNITY)
Admission: EM | Admit: 2022-02-02 | Discharge: 2022-02-05 | Disposition: A | Discharge status: Home / Self Care | Payer: BC Managed Care – PPO | Attending: General Surgery | Admitting: General Surgery

## 2022-02-02 ENCOUNTER — Emergency Department (HOSPITAL_COMMUNITY): Payer: BC Managed Care – PPO

## 2022-02-02 ENCOUNTER — Encounter (HOSPITAL_COMMUNITY): Payer: Self-pay

## 2022-02-02 DIAGNOSIS — Z7722 Contact with and (suspected) exposure to environmental tobacco smoke (acute) (chronic): Secondary | ICD-10-CM | POA: Insufficient documentation

## 2022-02-02 DIAGNOSIS — K838 Other specified diseases of biliary tract: Secondary | ICD-10-CM | POA: Diagnosis not present

## 2022-02-02 DIAGNOSIS — R1011 Right upper quadrant pain: Secondary | ICD-10-CM | POA: Diagnosis present

## 2022-02-02 DIAGNOSIS — R748 Abnormal levels of other serum enzymes: Secondary | ICD-10-CM | POA: Insufficient documentation

## 2022-02-02 DIAGNOSIS — R932 Abnormal findings on diagnostic imaging of liver and biliary tract: Secondary | ICD-10-CM | POA: Insufficient documentation

## 2022-02-02 DIAGNOSIS — K8020 Calculus of gallbladder without cholecystitis without obstruction: Secondary | ICD-10-CM | POA: Diagnosis present

## 2022-02-02 DIAGNOSIS — R945 Abnormal results of liver function studies: Secondary | ICD-10-CM | POA: Diagnosis not present

## 2022-02-02 DIAGNOSIS — K8012 Calculus of gallbladder with acute and chronic cholecystitis without obstruction: Secondary | ICD-10-CM | POA: Diagnosis not present

## 2022-02-02 LAB — COMPREHENSIVE METABOLIC PANEL
ALT: 80 U/L — ABNORMAL HIGH (ref 0–44)
AST: 88 U/L — ABNORMAL HIGH (ref 15–41)
Albumin: 3.7 g/dL (ref 3.5–5.0)
Alkaline Phosphatase: 71 U/L (ref 38–126)
Anion gap: 14 (ref 5–15)
BUN: 9 mg/dL (ref 6–20)
CO2: 22 mmol/L (ref 22–32)
Calcium: 9.1 mg/dL (ref 8.9–10.3)
Chloride: 100 mmol/L (ref 98–111)
Creatinine, Ser: 0.74 mg/dL (ref 0.44–1.00)
GFR, Estimated: >60 mL/min (ref >60)
Glucose, Bld: 118 mg/dL — ABNORMAL HIGH (ref 70–99)
Potassium: 3.6 mmol/L (ref 3.5–5.1)
Sodium: 136 mmol/L (ref 135–145)
Total Bilirubin: 1.3 mg/dL — ABNORMAL HIGH (ref 0.3–1.2)
Total Protein: 7.4 g/dL (ref 6.5–8.1)

## 2022-02-02 LAB — CBC WITH DIFFERENTIAL/PLATELET
Abs Immature Granulocytes: 0.04 10*3/uL (ref 0.00–0.07)
Basophils Absolute: 0.1 10*3/uL (ref 0.0–0.1)
Basophils Relative: 0 %
Eosinophils Absolute: 0.2 10*3/uL (ref 0.0–0.5)
Eosinophils Relative: 2 %
HCT: 39.3 % (ref 36.0–46.0)
Hemoglobin: 12.7 g/dL (ref 12.0–15.0)
Immature Granulocytes: 0 %
Lymphocytes Relative: 20 %
Lymphs Abs: 2.3 10*3/uL (ref 0.7–4.0)
MCH: 29.7 pg (ref 26.0–34.0)
MCHC: 32.3 g/dL (ref 30.0–36.0)
MCV: 92 fL (ref 80.0–100.0)
Monocytes Absolute: 0.7 10*3/uL (ref 0.1–1.0)
Monocytes Relative: 6 %
Neutro Abs: 8.4 10*3/uL — ABNORMAL HIGH (ref 1.7–7.7)
Neutrophils Relative %: 72 %
Platelets: 275 10*3/uL (ref 150–400)
RBC: 4.27 MIL/uL (ref 3.87–5.11)
RDW: 13.2 % (ref 11.5–15.5)
WBC: 11.8 10*3/uL — ABNORMAL HIGH (ref 4.0–10.5)
nRBC: 0 % (ref 0.0–0.2)

## 2022-02-02 LAB — URINALYSIS, ROUTINE W REFLEX MICROSCOPIC
Bacteria, UA: NONE SEEN
Bilirubin Urine: NEGATIVE
Glucose, UA: NEGATIVE mg/dL
Ketones, ur: 5 mg/dL — AB
Nitrite: NEGATIVE
Protein, ur: 30 mg/dL — AB
Specific Gravity, Urine: 1.027 (ref 1.005–1.030)
pH: 5 (ref 5.0–8.0)

## 2022-02-02 LAB — LIPASE, BLOOD: Lipase: 29 U/L (ref 11–51)

## 2022-02-02 MED ORDER — PROCHLORPERAZINE MALEATE 10 MG PO TABS: 10.0000 mg | ORAL_TABLET | Freq: Four times a day (QID) | ORAL | Status: DC | PRN

## 2022-02-02 MED ORDER — POLYETHYLENE GLYCOL 3350 17 G PO PACK: 17.0000 g | PACK | Freq: Every day | ORAL | Status: DC | PRN

## 2022-02-02 MED ORDER — ACETAMINOPHEN 650 MG RE SUPP: 650.0000 mg | Freq: Four times a day (QID) | RECTAL | Status: DC | PRN

## 2022-02-02 MED ORDER — PANTOPRAZOLE SODIUM 40 MG IV SOLR
40.0000 mg | Freq: Every day | INTRAVENOUS | Status: DC
  Administered 2022-02-02 – 2022-02-04 (×3): 40 mg via INTRAVENOUS
  Filled 2022-02-02 (×3): qty 10

## 2022-02-02 MED ORDER — MORPHINE SULFATE (PF) 2 MG/ML IV SOLN
2.0000 mg | INTRAVENOUS | Status: DC | PRN
  Administered 2022-02-03: 2 mg via INTRAVENOUS
  Filled 2022-02-02: qty 1

## 2022-02-02 MED ORDER — ENOXAPARIN SODIUM 40 MG/0.4ML IJ SOSY
40.0000 mg | PREFILLED_SYRINGE | INTRAMUSCULAR | Status: DC
  Administered 2022-02-02 – 2022-02-03 (×2): 40 mg via SUBCUTANEOUS
  Filled 2022-02-02 (×2): qty 0.4

## 2022-02-02 MED ORDER — SODIUM CHLORIDE 0.9 % IV SOLN
2.0000 g | INTRAVENOUS | Status: DC
  Administered 2022-02-02 – 2022-02-04 (×3): 2 g via INTRAVENOUS
  Filled 2022-02-02 (×3): qty 20

## 2022-02-02 MED ORDER — PROCHLORPERAZINE EDISYLATE 10 MG/2ML IJ SOLN: 5.0000 mg | Freq: Four times a day (QID) | INTRAMUSCULAR | Status: DC | PRN

## 2022-02-02 MED ORDER — HYDRALAZINE HCL 20 MG/ML IJ SOLN: 10.0000 mg | INTRAMUSCULAR | Status: DC | PRN

## 2022-02-02 MED ORDER — DIPHENHYDRAMINE HCL 25 MG PO CAPS: 25.0000 mg | ORAL_CAPSULE | Freq: Four times a day (QID) | ORAL | Status: DC | PRN

## 2022-02-02 MED ORDER — METHOCARBAMOL 1000 MG/10ML IJ SOLN
500.0000 mg | Freq: Three times a day (TID) | INTRAVENOUS | Status: DC | PRN
  Filled 2022-02-02: qty 5

## 2022-02-02 MED ORDER — ONDANSETRON 4 MG PO TBDP: 4.0000 mg | ORAL_TABLET | Freq: Four times a day (QID) | ORAL | Status: DC | PRN

## 2022-02-02 MED ORDER — SIMETHICONE 80 MG PO CHEW: 40.0000 mg | CHEWABLE_TABLET | Freq: Four times a day (QID) | ORAL | Status: DC | PRN

## 2022-02-02 MED ORDER — CITALOPRAM HYDROBROMIDE 20 MG PO TABS
20.0000 mg | ORAL_TABLET | Freq: Every day | ORAL | Status: DC
  Administered 2022-02-03 – 2022-02-05 (×2): 20 mg via ORAL
  Filled 2022-02-02 (×2): qty 1
  Filled 2022-02-02: qty 2

## 2022-02-02 MED ORDER — OXYCODONE-ACETAMINOPHEN 5-325 MG PO TABS
1.0000 | ORAL_TABLET | Freq: Once | ORAL | Status: AC
  Administered 2022-02-02: 1 via ORAL
  Filled 2022-02-02: qty 1

## 2022-02-02 MED ORDER — DIPHENHYDRAMINE HCL 50 MG/ML IJ SOLN: 25.0000 mg | Freq: Four times a day (QID) | INTRAMUSCULAR | Status: DC | PRN

## 2022-02-02 MED ORDER — SODIUM CHLORIDE 0.9 % IV SOLN: INTRAVENOUS | Status: DC

## 2022-02-02 MED ORDER — ACETAMINOPHEN 325 MG PO TABS: 650.0000 mg | ORAL_TABLET | Freq: Four times a day (QID) | ORAL | Status: DC | PRN

## 2022-02-02 MED ORDER — METHOCARBAMOL 500 MG PO TABS
500.0000 mg | ORAL_TABLET | Freq: Three times a day (TID) | ORAL | Status: DC | PRN
  Administered 2022-02-03: 500 mg via ORAL
  Filled 2022-02-02: qty 1

## 2022-02-02 MED ORDER — OXYCODONE HCL 5 MG PO TABS
5.0000 mg | ORAL_TABLET | ORAL | Status: DC | PRN
  Administered 2022-02-02 – 2022-02-03 (×2): 10 mg via ORAL
  Filled 2022-02-02 (×2): qty 2

## 2022-02-02 MED ORDER — ONDANSETRON HCL 4 MG/2ML IJ SOLN
4.0000 mg | Freq: Four times a day (QID) | INTRAMUSCULAR | Status: DC | PRN
  Administered 2022-02-02 – 2022-02-03 (×2): 4 mg via INTRAVENOUS
  Filled 2022-02-02 (×2): qty 2

## 2022-02-02 NOTE — ED Notes (Signed)
Paper consent is signed and at bedside.

## 2022-02-02 NOTE — H&P (Addendum)
[facility] Admission Note  Beth Thomas 03/11/1996  [id number].    Requesting MD: Wynona Dove Chief Complaint/Reason for Consult: cholelithiasis  HPI:  Beth Thomas is a 26 y.o. female with no significant PMH who returned to the ED today complaining of worsening abdominal pain. She was seen in the ED yesterday after developing upper abdominal pain after eating chili the night before. Pain was worse in the RUQ than the LUQ. Associated with nausea and vomiting. Tried taking pepto-bismal with no relief. In the ED she underwent u/s which showed cholelithiasis without evidence of cholecystitis; gallbladder polyp; prominent common bile duct at 6.8 mm  with no intrahepatic biliary prominence. WBC 15.6 and LFTs mildly elevated with AST 113, ALT 51, Alk phos 43, Tbili 0.7. She was symptom free by the time she was evaluated by an MD, therefore discharged home with strict return precautions and outpatient follow up with general surgery. She has had similar symptoms intermittently since 10/17. After leaving the ED she states that her pain returned early this morning. Associated with nausea. Tried taking percocet and zofran with minimal improvement, so she returned to the ED. Today her WBC is 11.8, LFTs still slightly elevated with AT 88, ALT 80, Alk phos 71, Tbili 1.3. General surgery asked to see. Of note, patient does report 2 other episodes this month of similar symptoms but less severe, and these resolved without intervention.  She has undergone general anesthesia before for tonsillectomy without complications but does note that her father had a reaction to general anesthesia that led to ICU admisson.  Abdominal surgical history: none Anticoagulants: none Nonsmoker Denies alcohol or illicit drug use Employment: Pharmacist, hospital  ROS: ROS  All systems reviewed and otherwise negative except for as above  Family History  Problem Relation Age of Onset   Hyperlipidemia Mother     Hypertension Mother    Diabetes Mother    Hypertension Father    Hyperlipidemia Father     Past Medical History:  Diagnosis Date   Allergy    Unspecified vitamin D deficiency     Past Surgical History:  Procedure Laterality Date   TONSILECTOMY/ADENOIDECTOMY WITH MYRINGOTOMY  01/2016    Social History:  reports that she has never smoked. She has been exposed to tobacco smoke. She has never used smokeless tobacco. She reports that she does not drink alcohol and does not use drugs.  Allergies: No Known Allergies  (Not in a hospital admission)   Prior to Admission medications   Medication Sig Start Date End Date Taking? Authorizing Provider  Cetirizine HCl 10 MG CAPS Take 10 mg by mouth daily.    [provider]  citalopram (CELEXA) 20 MG tablet TAKE 1 TABLET BY MOUTH DAILY FOR MOOD 11/30/20   Liane Comber, NP  Cyanocobalamin (B-12 PO) Take by mouth.    [provider]  drospirenone-ethinyl estradiol (SYEDA) 3-0.03 MG tablet Take 1 tablet Daily 01/05/22   Darrol Jump, NP  Ferrous Sulfate (IRON PO) Take by mouth daily.    [provider]  fluticasone (FLONASE) 50 MCG/ACT nasal spray Place 1 spray into both nostrils daily. 01/21/21 01/21/22  Liane Comber, NP  ibuprofen (ADVIL) 200 MG tablet Take 200 mg by mouth every 6 (six) hours as needed. Takes 4 tablets prn    [provider]  Insulin Pen Needle (NOVOFINE PLUS PEN NEEDLE) 32G X 4 MM MISC Use new needle daily on prefilled pen syringe 05/29/21   Darrol Jump, NP  Liraglutide -Weight Management (SAXENDA) 18 MG/3ML  SOPN Inject 3 mg into the skin daily. Inject one pen SQ daily for weight loss Patient not taking: Reported on 12/25/2021 05/19/21   Liane Comber, NP  methocarbamol (ROBAXIN) 750 MG tablet Take 1 tablet (750 mg total) by mouth every 8 (eight) hours as needed for muscle spasms. Patient not taking: Reported on 12/25/2021 10/01/21   Mcarthur Rossetti, MD  ondansetron (ZOFRAN) 4  MG tablet Take 1 tablet (4 mg total) by mouth every 6 (six) hours. 02/01/22   Blanchie Dessert, MD  oxyCODONE-acetaminophen (PERCOCET/ROXICET) 5-325 MG tablet Take 1 tablet by mouth every 6 (six) hours as needed for severe pain. 02/01/22   Blanchie Dessert, MD  penicillin v potassium (VEETID) 500 MG tablet Take 1 tablet (500 mg total) by mouth 3 (three) times daily. 12/25/21   Darrol Jump, NP  phentermine (ADIPEX-P) 37.5 MG tablet Take 1/2 to 1 tablet every morning for dieting & weightloss 07/16/21   Alycia Rossetti, NP  predniSONE (DELTASONE) 50 MG tablet Take one tablet daily for 5 days. 10/01/21   Mcarthur Rossetti, MD  rosuvastatin (CRESTOR) 10 MG tablet Take 1 tablet (10 mg total) by mouth daily. 11/05/21 11/05/22  Alycia Rossetti, NP  Vitamin D, Cholecalciferol, 25 MCG (1000 UT) TABS Take by mouth.    [provider]    Blood pressure 122/80, pulse 75, temperature 98 F (36.7 C), temperature source Oral, resp. rate 16, height _0  (1.6 m), weight 79.8 kg, last menstrual period 01/18/2022, SpO2 100 %. Physical Exam: General: pleasant, WD/WN female who is laying in bed in NAD HEENT: head is normocephalic, atraumatic.  Sclera are noninjected.  Pupils equal and round.  Ears and nose without any masses or lesions.  Mouth is pink and moist.  Heart: regular, rate, and rhythm.  Normal s1,s2. No obvious murmurs, gallops, or rubs noted.  Palpable radial and pedal pulses bilaterally  Lungs: CTAB, no wheezes, rhonchi, or rales noted.  Respiratory effort nonlabored Abd: soft, ND, +BS, no masses, hernias, or organomegaly. Mild TTP in RUQ and epigastrium without rebound or guarding  MS: no BUE/BLE edema, calves soft and nontender Skin: warm and dry with no masses, lesions, or rashes Psych: A&Ox4 with an appropriate affect Neuro: MAEs, no gross motor or sensory deficits BUE/BLE  Results for orders placed or performed during the hospital encounter of 02/02/22 (from the past 48  hour(s))  Comprehensive metabolic panel     Status: Abnormal   Collection Time: 02/02/22 12:47 PM  Result Value Ref Range   Sodium 136 135 - 145 mmol/L   Potassium 3.6 3.5 - 5.1 mmol/L   Chloride 100 98 - 111 mmol/L   CO2 22 22 - 32 mmol/L   Glucose, Bld 118 (H) 70 - 99 mg/dL    Comment: Glucose reference range applies only to samples taken after fasting for at least 8 hours.   BUN 9 6 - 20 mg/dL   Creatinine, Ser 0.74 0.44 - 1.00 mg/dL   Calcium 9.1 8.9 - 10.3 mg/dL   Total Protein 7.4 6.5 - 8.1 g/dL   Albumin 3.7 3.5 - 5.0 g/dL   AST 88 (H) 15 - 41 U/L   ALT 80 (H) 0 - 44 U/L   Alkaline Phosphatase 71 38 - 126 U/L   Total Bilirubin 1.3 (H) 0.3 - 1.2 mg/dL   GFR, Estimated >60 >60 mL/min    Comment: (NOTE) Calculated using the CKD-EPI Creatinine Equation (2021)    Anion gap 14 5 - 15  Comment: Performed at [facility], [place] [address] [place], [place] [address]  CBC with Differential     Status: Abnormal   Collection Time: 02/02/22 12:47 PM  Result Value Ref Range   WBC 11.8 (H) 4.0 - 10.5 K/uL   RBC 4.27 3.87 - 5.11 MIL/uL   Hemoglobin 12.7 12.0 - 15.0 g/dL   HCT 39.3 36.0 - 46.0 %   MCV 92.0 80.0 - 100.0 fL   MCH 29.7 26.0 - 34.0 pg   MCHC 32.3 30.0 - 36.0 g/dL   RDW 13.2 11.5 - 15.5 %   Platelets 275 150 - 400 K/uL   nRBC 0.0 0.0 - 0.2 %   Neutrophils Relative % 72 %   Neutro Abs 8.4 (H) 1.7 - 7.7 K/uL   Lymphocytes Relative 20 %   Lymphs Abs 2.3 0.7 - 4.0 K/uL   Monocytes Relative 6 %   Monocytes Absolute 0.7 0.1 - 1.0 K/uL   Eosinophils Relative 2 %   Eosinophils Absolute 0.2 0.0 - 0.5 K/uL   Basophils Relative 0 %   Basophils Absolute 0.1 0.0 - 0.1 K/uL   Immature Granulocytes 0 %   Abs Immature Granulocytes 0.04 0.00 - 0.07 K/uL    Comment: Performed at [facility] [address] [place], [place] [address]  Lipase, blood     Status: None   Collection Time: 02/02/22 12:47 PM  Result Value Ref Range   Lipase 29 11 - 51 U/L    Comment:  Performed at [facility], [place] [address] [place], [place] [address]  Urinalysis, Routine w reflex microscopic Urine, Clean Catch     Status: Abnormal   Collection Time: 02/02/22  2:00 PM  Result Value Ref Range   Color, Urine AMBER (A) YELLOW    Comment: BIOCHEMICALS MAY BE AFFECTED BY COLOR   APPearance CLEAR CLEAR   Specific Gravity, Urine 1.027 1.005 - 1.030   pH 5.0 5.0 - 8.0   Glucose, UA NEGATIVE NEGATIVE mg/dL   Hgb urine dipstick SMALL (A) NEGATIVE   Bilirubin Urine NEGATIVE NEGATIVE   Ketones, ur 5 (A) NEGATIVE mg/dL   Protein, ur 30 (A) NEGATIVE mg/dL   Nitrite NEGATIVE NEGATIVE   Leukocytes,Ua TRACE (A) NEGATIVE   RBC / HPF 0-5 0 - 5 RBC/hpf   WBC, UA 0-5 0 - 5 WBC/hpf   Bacteria, UA NONE SEEN NONE SEEN   Squamous Epithelial / LPF 0-5 0 - 5   Mucus PRESENT     Comment: Performed at [facility], [place] [address], [place], [place] [address]   US Abdomen Limited RUQ (LIVER/GB)  Result Date: 02/01/2022 CLINICAL DATA:  Elevated hepatic transaminases. EXAM: ULTRASOUND ABDOMEN LIMITED RIGHT UPPER QUADRANT COMPARISON:  None Available. FINDINGS: Gallbladder: There are multiple layering stones in the gallbladder up to 1.5 cm. There is a 7.4 mm echogenic polyp of the superior wall near the neck and mild sludge. There is no wall thickening, sonographic Murphy's sign or pericholecystic fluid. Common bile duct: Diameter: 6.8 mm, which is prominent especially considering age, but no intrahepatic biliary prominence is seen. Liver: No focal lesion identified. Within normal limits in parenchymal echogenicity. Portal vein is patent on color Doppler imaging with normal direction of blood flow towards the liver. Other: None. IMPRESSION: 1. Cholelithiasis without evidence of cholecystitis. 2. Gallbladder polyp measuring 7.4 mm. Recommend follow-up ultrasound in 6 months. 3. Prominent common bile duct considering age. No intrahepatic biliary prominence is seen. Recommend correlation  with  liver function tests. If there is concern for biliary obstruction, MRCP could be considered. 4. Management and follow-up of gallbladder polyps: 5. Joint guidelines between the European Society of Gastrointestinal and Abdominal Radiology (), European Association for Endoscopic Surgery and other Interventional Techniques (ACES), International Society of Digestive Surgery - European () and European Society of Gastrointestinal Endoscopy (), International Society of Digestive Surgery - European () and European Society of Gastrointestinal Endoscopy (). Electronically Signed   By: Telford Nab M.D.   On: 02/01/2022 06:00      Assessment/Plan Symptomatic cholelithiasis Elevated LFTs - Patient with at least 4 episodes of symptomatic cholelithiasis over the last month. Her u/s does not show any signs of cholecystitis but she does have a mild leukocytosis, LFTs slightly elevated, and she is still tender in the RUQ on abdominal exam concerning for early cholecystitis. Recommend admission for antibiotics and laparoscopic cholecystectomy tomoorrow. I have explained the procedure, risks, and aftercare of cholecystectomy.  Risks include but are not limited to bleeding, infection, wound problems, anesthesia, diarrhea, bile leak, injury to common bile duct/liver/intestine.  She seems to understand and agrees to proceed. Will repeat LFTs in the AM. If bilirubin is significantly higher may need GI consult +/- MRCP. If similar or trending down will plan for IOC.  ID - rocephin VTE - lovenox FEN - IVF, CLD, NPO after MN Foley - none   I reviewed ED provider notes, last 24 h vitals and pain scores, last 48 h intake and output, last 24 h labs and trends, and last 24 h imaging results.   Richard Miu, [facility] 02/02/2022, 4:12 PM Please see Amion for pager number during day hours 7:00am-4:30pm

## 2022-02-02 NOTE — ED Provider Notes (Signed)
[facility] Provider Note   CSN: [id number] Arrival date & time: 02/02/22  1203     History  Chief Complaint  Patient presents with   Abdominal Pain    Beth Thomas is a 26 y.o. female.  HPI 26 year old female with a history of allergies and vitamin D deficiency presents to the ER with complaints of symptomatic cholelithiasis.  Patient was seen here yesterday in the ER, had a CT scan which showed cholelithiasis without cholecystitis.  She was discharged with Zofran and Percocet and was given general surgery follow-up.  She attempted to call the practice but had not heard back.  She had taken a home Percocet yesterday as her pain had returned, and this morning woke up with severe pain that was unrelieved with the additional Percocet that she took.  She has been unable to tolerate food.  Denies any fever, hematemesis, urinary symptoms or change in bowel habits    Home Medications Prior to Admission medications   Medication Sig Start Date End Date Taking? Authorizing Provider  Cetirizine HCl 10 MG CAPS Take 10 mg by mouth daily.    [provider]  citalopram (CELEXA) 20 MG tablet TAKE 1 TABLET BY MOUTH DAILY FOR MOOD 11/30/20   Judd Gaudierorbett, Ashley, NP  Cyanocobalamin (B-12 PO) Take by mouth.    [provider]  drospirenone-ethinyl estradiol (SYEDA) 3-0.03 MG tablet Take 1 tablet Daily 01/05/22   Adela Glimpseranford, Tonya, NP  Ferrous Sulfate (IRON PO) Take by mouth daily.    [provider]  fluticasone (FLONASE) 50 MCG/ACT nasal spray Place 1 spray into both nostrils daily. 01/21/21 01/21/22  Judd Gaudierorbett, Ashley, NP  ibuprofen (ADVIL) 200 MG tablet Take 200 mg by mouth every 6 (six) hours as needed. Takes 4 tablets prn    [provider]  Insulin Pen Needle (NOVOFINE PLUS PEN NEEDLE) 32G X 4 MM MISC Use new needle daily on prefilled pen syringe 05/29/21   Adela Glimpseranford, Tonya, NP  Liraglutide -Weight Management (SAXENDA) 18 MG/3ML SOPN  Inject 3 mg into the skin daily. Inject one pen SQ daily for weight loss Patient not taking: Reported on 12/25/2021 05/19/21   Judd Gaudierorbett, Ashley, NP  methocarbamol (ROBAXIN) 750 MG tablet Take 1 tablet (750 mg total) by mouth every 8 (eight) hours as needed for muscle spasms. Patient not taking: Reported on 12/25/2021 10/01/21   Kathryne HitchBlackman, Christopher Y, MD  ondansetron (ZOFRAN) 4 MG tablet Take 1 tablet (4 mg total) by mouth every 6 (six) hours. 02/01/22   Gwyneth SproutPlunkett, Whitney, MD  oxyCODONE-acetaminophen (PERCOCET/ROXICET) 5-325 MG tablet Take 1 tablet by mouth every 6 (six) hours as needed for severe pain. 02/01/22   Gwyneth SproutPlunkett, Whitney, MD  penicillin v potassium (VEETID) 500 MG tablet Take 1 tablet (500 mg total) by mouth 3 (three) times daily. 12/25/21   Adela Glimpseranford, Tonya, NP  phentermine (ADIPEX-P) 37.5 MG tablet Take 1/2 to 1 tablet every morning for dieting & weightloss 07/16/21   Raynelle DickWilkinson, Dana E, NP  predniSONE (DELTASONE) 50 MG tablet Take one tablet daily for 5 days. 10/01/21   Kathryne HitchBlackman, Christopher Y, MD  rosuvastatin (CRESTOR) 10 MG tablet Take 1 tablet (10 mg total) by mouth daily. 11/05/21 11/05/22  Raynelle DickWilkinson, Dana E, NP  Vitamin D, Cholecalciferol, 25 MCG (1000 UT) TABS Take by mouth.    [provider]      Allergies    Patient has no known allergies.    Review of Systems   Review of Systems Ten systems reviewed and  are negative for acute change, except as noted in the HPI.   Physical Exam Updated Vital Signs BP 122/80 (BP Location: Right Arm)   Pulse 75   Temp 98 F (36.7 C) (Oral)   Resp 16   Ht 5\' 3"  (1.6 m)   Wt 79.8 kg   LMP 01/18/2022   SpO2 100%   BMI 31.18 kg/m  Physical Exam Vitals and nursing note reviewed.  Constitutional:      General: She is not in acute distress.    Appearance: She is well-developed.  HENT:     Head: Normocephalic and atraumatic.  Eyes:     Conjunctiva/sclera: Conjunctivae normal.  Cardiovascular:     Rate and Rhythm: Normal rate  and regular rhythm.     Heart sounds: No murmur heard. Pulmonary:     Effort: Pulmonary effort is normal. No respiratory distress.     Breath sounds: Normal breath sounds.  Abdominal:     Palpations: Abdomen is soft.     Tenderness: There is abdominal tenderness in the right upper quadrant.  Musculoskeletal:        General: No swelling.     Cervical back: Neck supple.  Skin:    General: Skin is warm and dry.     Capillary Refill: Capillary refill takes less than 2 seconds.  Neurological:     General: No focal deficit present.     Mental Status: She is alert.  Psychiatric:        Mood and Affect: Mood normal.     ED Results / Procedures / Treatments   Labs (all labs ordered are listed, but only abnormal results are displayed) Labs Reviewed  COMPREHENSIVE METABOLIC PANEL - Abnormal; Notable for the following components:      Result Value   Glucose, Bld 118 (*)    AST 88 (*)    ALT 80 (*)    Total Bilirubin 1.3 (*)    All other components within normal limits  CBC WITH DIFFERENTIAL/PLATELET - Abnormal; Notable for the following components:   WBC 11.8 (*)    Neutro Abs 8.4 (*)    All other components within normal limits  URINALYSIS, ROUTINE W REFLEX MICROSCOPIC - Abnormal; Notable for the following components:   Color, Urine AMBER (*)    Hgb urine dipstick SMALL (*)    Ketones, ur 5 (*)    Protein, ur 30 (*)    Leukocytes,Ua TRACE (*)    All other components within normal limits  LIPASE, BLOOD  HIV ANTIBODY (ROUTINE TESTING W REFLEX)    EKG None  Radiology 13/07/2021 Abdomen Limited RUQ (LIVER/GB)  Result Date: 02/01/2022 CLINICAL DATA:  Elevated hepatic transaminases. EXAM: ULTRASOUND ABDOMEN LIMITED RIGHT UPPER QUADRANT COMPARISON:  None Available. FINDINGS: Gallbladder: There are multiple layering stones in the gallbladder up to 1.5 cm. There is a 7.4 mm echogenic polyp of the superior wall near the neck and mild sludge. There is no wall thickening, sonographic Murphy's  sign or pericholecystic fluid. Common bile duct: Diameter: 6.8 mm, which is prominent especially considering age, but no intrahepatic biliary prominence is seen. Liver: No focal lesion identified. Within normal limits in parenchymal echogenicity. Portal vein is patent on color Doppler imaging with normal direction of blood flow towards the liver. Other: None. IMPRESSION: 1. Cholelithiasis without evidence of cholecystitis. 2. Gallbladder polyp measuring 7.4 mm. Recommend follow-up ultrasound in 6 months. 3. Prominent common bile duct considering age. No intrahepatic biliary prominence is seen. Recommend correlation with liver function tests.  If there is concern for biliary obstruction, MRCP could be considered. 4. Management and follow-up of gallbladder polyps: 5. Joint guidelines between the European Society of Gastrointestinal and Abdominal Radiology (), European Association for Endoscopic Surgery and other Interventional Techniques (ACES), International Society of Digestive Surgery - European () and European Society of Gastrointestinal Endoscopy (), International Society of Digestive Surgery - European () and European Society of Gastrointestinal Endoscopy (). Electronically Signed   By: Almira Bar M.D.   On: 02/01/2022 06:00    Procedures Procedures    Medications Ordered in ED Medications  enoxaparin (LOVENOX) injection 40 mg (has no administration in time range)  0.9 %  sodium chloride infusion (has no administration in time range)  hydrALAZINE (APRESOLINE) injection 10 mg (has no administration in time range)  pantoprazole (PROTONIX) injection 40 mg (has no administration in time range)  simethicone (MYLICON) chewable tablet 40 mg (has no administration in time range)  ondansetron (ZOFRAN-ODT) disintegrating tablet 4 mg (has no administration in time range)    Or  ondansetron (ZOFRAN) injection 4 mg (has no administration in time range)  prochlorperazine (COMPAZINE) tablet 10 mg (has no  administration in time range)    Or  prochlorperazine (COMPAZINE) injection 5-10 mg (has no administration in time range)  polyethylene glycol (MIRALAX / GLYCOLAX) packet 17 g (has no administration in time range)  diphenhydrAMINE (BENADRYL) capsule 25 mg (has no administration in time range)    Or  diphenhydrAMINE (BENADRYL) injection 25 mg (has no administration in time range)  methocarbamol (ROBAXIN) tablet 500 mg (has no administration in time range)    Or  methocarbamol (ROBAXIN) 500 mg in dextrose 5 % 50 mL IVPB (has no administration in time range)  morphine (PF) 2 MG/ML injection 2-4 mg (has no administration in time range)  oxyCODONE (Oxy IR/ROXICODONE) immediate release tablet 5-10 mg (has no administration in time range)  acetaminophen (TYLENOL) tablet 650 mg (has no administration in time range)    Or  acetaminophen (TYLENOL) suppository 650 mg (has no administration in time range)  cefTRIAXone (ROCEPHIN) 2 g in sodium chloride 0.9 % 100 mL IVPB (has no administration in time range)  oxyCODONE-acetaminophen (PERCOCET/ROXICET) 5-325 MG per tablet 1 tablet (1 tablet Oral Given 02/02/22 1251)    ED Course/ Medical Decision Making/ A&P                           Medical Decision Making This patient complains of RUQ pain, this involves an extensive number of treatment options, and is a complaint that carries with it a high risk of complications and morbidity.  The differential diagnosis includes cholelithiasis, cholecystitis, small bowel obstruction, GERD  I Ordered, reviewed, and interpreted labs, which included  -CBC with a leukocytosis of 11.8, improved from yesterday -CMP without any significant abnormalities, LFTs slightly elevated at 88 and 80 which is improved from yesterday.  Total bilirubin of 1.3, elevated from yesterday's labs -Lipase normal -UA with small amount of hemoglobin, ketonuria, proteinuria  I ordered medication Percocet for pain I independently visualized  and interpreted CT abdomen from yesterday imaging which showed cholelithiasis without cholecystitis with bile duct dilation I consulted general surgery and discussed lab and imaging findings. They will come see and evaluate the patient  General surgery to admit.   After the interventions stated above, I reevaluated the patient and found pain improved, general surgery to admit for surgical planning.  Final Clinical Impression(s) / ED Diagnoses Final diagnoses:  Symptomatic cholelithiasis    Rx / DC Orders ED Discharge Orders     None         Mare Ferrari, PA-C 02/02/22 1629    Sloan Leiter, DO 02/03/22 2351

## 2022-02-02 NOTE — ED Triage Notes (Addendum)
Patient was seen here yesterday dx with gallstones.  Awaiting a call from surgeon.  Reports pain got better but got worse again this am after leaving last night.  +nausea. Patient took percocet and zofran at 1030am.  Tried a protein shake at 8am only drinking half., Nothing since then except sips with medicaiton.

## 2022-02-02 NOTE — ED Provider Triage Note (Signed)
Emergency Medicine Provider Triage Evaluation Note  Beth Thomas , a 26 y.o. female  was evaluated in triage.  Pt complains of worsening left upper quadrant pain as well as food intolerance.  Patient states that she tried to contact the surgeon but had no response.  She has been taking her at home Zofran and Percocet with minimal relief of symptoms.  She presents emergency department for worsening pain as well as inability to tolerate food upon prior discharge.  Denies fever, hematemesis, urinary symptoms, change in bowel habits.  Review of Systems  Positive: See above Negative:   Physical Exam  BP 131/84 (BP Location: Right Arm)   Pulse 74   Temp 98 F (36.7 C) (Oral)   Resp 18   Ht 5\' 3"  (1.6 m)   Wt 79.8 kg   LMP 01/18/2022   SpO2 100%   BMI 31.18 kg/m  Gen:   Awake, no distress   Resp:  Normal effort  MSK:   Moves extremities without difficulty  Other:  Right upper quadrant tenderness to palpation.  Medical Decision Making  Medically screening exam initiated at 12:37 PM.  Appropriate orders placed.  Zurisadai Helminiak was informed that the remainder of the evaluation will be completed by another provider, this initial triage assessment does not replace that evaluation, and the importance of remaining in the ED until their evaluation is complete.     Albertine Patricia, Peter Garter 02/02/22 1240

## 2022-02-02 NOTE — ED Notes (Signed)
Patient is currently asleep in bed. Call bell is within reach 

## 2022-02-03 ENCOUNTER — Other Ambulatory Visit: Payer: Self-pay

## 2022-02-03 ENCOUNTER — Encounter (HOSPITAL_COMMUNITY)
Admission: EM | Disposition: A | Discharge status: Home / Self Care | Payer: Self-pay | Source: Home / Self Care | Attending: Emergency Medicine

## 2022-02-03 ENCOUNTER — Observation Stay (HOSPITAL_COMMUNITY): Payer: BC Managed Care – PPO | Admitting: Certified Registered Nurse Anesthetist

## 2022-02-03 ENCOUNTER — Encounter (HOSPITAL_COMMUNITY): Payer: Self-pay

## 2022-02-03 ENCOUNTER — Observation Stay (HOSPITAL_COMMUNITY): Payer: BC Managed Care – PPO

## 2022-02-03 DIAGNOSIS — R933 Abnormal findings on diagnostic imaging of other parts of digestive tract: Secondary | ICD-10-CM

## 2022-02-03 DIAGNOSIS — G8929 Other chronic pain: Secondary | ICD-10-CM

## 2022-02-03 DIAGNOSIS — R7401 Elevation of levels of liver transaminase levels: Secondary | ICD-10-CM | POA: Diagnosis not present

## 2022-02-03 DIAGNOSIS — K8020 Calculus of gallbladder without cholecystitis without obstruction: Secondary | ICD-10-CM

## 2022-02-03 DIAGNOSIS — R1011 Right upper quadrant pain: Secondary | ICD-10-CM | POA: Diagnosis not present

## 2022-02-03 HISTORY — PX: CHOLECYSTECTOMY: SHX55

## 2022-02-03 LAB — CBC
HCT: 34.3 % — ABNORMAL LOW (ref 36.0–46.0)
Hemoglobin: 11.2 g/dL — ABNORMAL LOW (ref 12.0–15.0)
MCH: 30.1 pg (ref 26.0–34.0)
MCHC: 32.7 g/dL (ref 30.0–36.0)
MCV: 92.2 fL (ref 80.0–100.0)
Platelets: 209 10*3/uL (ref 150–400)
RBC: 3.72 MIL/uL — ABNORMAL LOW (ref 3.87–5.11)
RDW: 13.2 % (ref 11.5–15.5)
WBC: 5.7 10*3/uL (ref 4.0–10.5)
nRBC: 0 % (ref 0.0–0.2)

## 2022-02-03 LAB — COMPREHENSIVE METABOLIC PANEL
ALT: 64 U/L — ABNORMAL HIGH (ref 0–44)
AST: 54 U/L — ABNORMAL HIGH (ref 15–41)
Albumin: 2.9 g/dL — ABNORMAL LOW (ref 3.5–5.0)
Alkaline Phosphatase: 59 U/L (ref 38–126)
Anion gap: 11 (ref 5–15)
BUN: 6 mg/dL (ref 6–20)
CO2: 24 mmol/L (ref 22–32)
Calcium: 8.4 mg/dL — ABNORMAL LOW (ref 8.9–10.3)
Chloride: 104 mmol/L (ref 98–111)
Creatinine, Ser: 0.78 mg/dL (ref 0.44–1.00)
GFR, Estimated: >60 mL/min (ref >60)
Glucose, Bld: 96 mg/dL (ref 70–99)
Potassium: 3.7 mmol/L (ref 3.5–5.1)
Sodium: 139 mmol/L (ref 135–145)
Total Bilirubin: 0.4 mg/dL (ref 0.3–1.2)
Total Protein: 6 g/dL — ABNORMAL LOW (ref 6.5–8.1)

## 2022-02-03 LAB — HIV ANTIBODY (ROUTINE TESTING W REFLEX): HIV Screen 4th Generation wRfx: NONREACTIVE

## 2022-02-03 LAB — PREGNANCY, URINE: Preg Test, Ur: NEGATIVE

## 2022-02-03 SURGERY — LAPAROSCOPIC CHOLECYSTECTOMY WITH INTRAOPERATIVE CHOLANGIOGRAM
Anesthesia: General

## 2022-02-03 MED ORDER — MIDAZOLAM HCL 5 MG/5ML IJ SOLN
INTRAMUSCULAR | Status: DC | PRN
  Administered 2022-02-03: 2 mg via INTRAVENOUS

## 2022-02-03 MED ORDER — BUPIVACAINE-EPINEPHRINE (PF) 0.25% -1:200000 IJ SOLN
INTRAMUSCULAR | Status: AC
  Filled 2022-02-03: qty 30

## 2022-02-03 MED ORDER — DEXAMETHASONE SODIUM PHOSPHATE 10 MG/ML IJ SOLN
INTRAMUSCULAR | Status: AC
  Filled 2022-02-03: qty 1

## 2022-02-03 MED ORDER — ROCURONIUM BROMIDE 10 MG/ML (PF) SYRINGE
PREFILLED_SYRINGE | INTRAVENOUS | Status: AC
  Filled 2022-02-03: qty 30

## 2022-02-03 MED ORDER — FENTANYL CITRATE (PF) 250 MCG/5ML IJ SOLN
INTRAMUSCULAR | Status: AC
  Filled 2022-02-03: qty 5

## 2022-02-03 MED ORDER — PROPOFOL 10 MG/ML IV BOLUS
INTRAVENOUS | Status: AC
  Filled 2022-02-03: qty 20

## 2022-02-03 MED ORDER — SODIUM CHLORIDE 0.9 % IR SOLN
Status: DC | PRN
  Administered 2022-02-03: 1000 mL

## 2022-02-03 MED ORDER — OXYCODONE HCL 5 MG/5ML PO SOLN: 5.0000 mg | Freq: Once | ORAL | Status: DC | PRN

## 2022-02-03 MED ORDER — BUPIVACAINE-EPINEPHRINE 0.25% -1:200000 IJ SOLN
INTRAMUSCULAR | Status: DC | PRN
  Administered 2022-02-03: 9 mL

## 2022-02-03 MED ORDER — HYDROMORPHONE HCL 1 MG/ML IJ SOLN
0.2500 mg | INTRAMUSCULAR | Status: DC | PRN
  Administered 2022-02-03 (×2): 0.5 mg via INTRAVENOUS

## 2022-02-03 MED ORDER — ONDANSETRON HCL 4 MG/2ML IJ SOLN
INTRAMUSCULAR | Status: AC
  Filled 2022-02-03: qty 2

## 2022-02-03 MED ORDER — MORPHINE SULFATE (PF) 2 MG/ML IV SOLN
2.0000 mg | INTRAVENOUS | Status: DC | PRN
  Administered 2022-02-03: 2 mg via INTRAVENOUS
  Administered 2022-02-04: 4 mg via INTRAVENOUS
  Filled 2022-02-03: qty 1
  Filled 2022-02-03: qty 2

## 2022-02-03 MED ORDER — LACTATED RINGERS IV SOLN: INTRAVENOUS | Status: DC

## 2022-02-03 MED ORDER — ONDANSETRON HCL 4 MG/2ML IJ SOLN
INTRAMUSCULAR | Status: DC | PRN
  Administered 2022-02-03: 4 mg via INTRAVENOUS

## 2022-02-03 MED ORDER — LIDOCAINE 2% (20 MG/ML) 5 ML SYRINGE
INTRAMUSCULAR | Status: AC
  Filled 2022-02-03: qty 5

## 2022-02-03 MED ORDER — KETOROLAC TROMETHAMINE 30 MG/ML IJ SOLN: 30.0000 mg | Freq: Once | INTRAMUSCULAR | Status: DC | PRN

## 2022-02-03 MED ORDER — HYDROMORPHONE HCL 1 MG/ML IJ SOLN
INTRAMUSCULAR | Status: AC
  Filled 2022-02-03: qty 1

## 2022-02-03 MED ORDER — SUGAMMADEX SODIUM 200 MG/2ML IV SOLN
INTRAVENOUS | Status: DC | PRN
  Administered 2022-02-03: 200 mg via INTRAVENOUS

## 2022-02-03 MED ORDER — LIDOCAINE 2% (20 MG/ML) 5 ML SYRINGE
INTRAMUSCULAR | Status: AC
  Filled 2022-02-03: qty 10

## 2022-02-03 MED ORDER — OXYCODONE HCL 5 MG PO TABS: 5.0000 mg | ORAL_TABLET | Freq: Once | ORAL | Status: DC | PRN

## 2022-02-03 MED ORDER — MIDAZOLAM HCL 2 MG/2ML IJ SOLN
INTRAMUSCULAR | Status: AC
  Filled 2022-02-03: qty 2

## 2022-02-03 MED ORDER — ORAL CARE MOUTH RINSE: 15.0000 mL | Freq: Once | OROMUCOSAL | Status: AC

## 2022-02-03 MED ORDER — ONDANSETRON HCL 4 MG/2ML IJ SOLN: 4.0000 mg | Freq: Once | INTRAMUSCULAR | Status: DC | PRN

## 2022-02-03 MED ORDER — FENTANYL CITRATE (PF) 250 MCG/5ML IJ SOLN
INTRAMUSCULAR | Status: DC | PRN
  Administered 2022-02-03: 50 ug via INTRAVENOUS
  Administered 2022-02-03: 100 ug via INTRAVENOUS
  Administered 2022-02-03: 50 ug via INTRAVENOUS

## 2022-02-03 MED ORDER — OXYCODONE HCL 5 MG PO TABS
5.0000 mg | ORAL_TABLET | ORAL | Status: DC | PRN
  Administered 2022-02-03: 5 mg via ORAL
  Administered 2022-02-04: 10 mg via ORAL
  Filled 2022-02-03: qty 2
  Filled 2022-02-03: qty 1

## 2022-02-03 MED ORDER — ROCURONIUM BROMIDE 10 MG/ML (PF) SYRINGE
PREFILLED_SYRINGE | INTRAVENOUS | Status: DC | PRN
  Administered 2022-02-03: 70 mg via INTRAVENOUS

## 2022-02-03 MED ORDER — DEXMEDETOMIDINE HCL IN NACL 80 MCG/20ML IV SOLN
INTRAVENOUS | Status: DC | PRN
  Administered 2022-02-03 (×3): 4 ug via BUCCAL

## 2022-02-03 MED ORDER — SODIUM CHLORIDE 0.9 % IV SOLN: INTRAVENOUS | Status: DC

## 2022-02-03 MED ORDER — PHENYLEPHRINE 80 MCG/ML (10ML) SYRINGE FOR IV PUSH (FOR BLOOD PRESSURE SUPPORT)
PREFILLED_SYRINGE | INTRAVENOUS | Status: AC
  Filled 2022-02-03: qty 10

## 2022-02-03 MED ORDER — SODIUM CHLORIDE 0.9 % IV SOLN: INTRAVENOUS | Status: DC | PRN

## 2022-02-03 MED ORDER — CHLORHEXIDINE GLUCONATE 0.12 % MT SOLN: 15.0000 mL | Freq: Once | OROMUCOSAL | Status: AC

## 2022-02-03 MED ORDER — PROPOFOL 1000 MG/100ML IV EMUL
INTRAVENOUS | Status: AC
  Filled 2022-02-03: qty 100

## 2022-02-03 MED ORDER — PROPOFOL 10 MG/ML IV BOLUS
INTRAVENOUS | Status: DC | PRN
  Administered 2022-02-03: 200 mg via INTRAVENOUS
  Administered 2022-02-03: 50 mg via INTRAVENOUS

## 2022-02-03 MED ORDER — 0.9 % SODIUM CHLORIDE (POUR BTL) OPTIME
TOPICAL | Status: DC | PRN
  Administered 2022-02-03: 1000 mL

## 2022-02-03 MED ORDER — DEXAMETHASONE SODIUM PHOSPHATE 10 MG/ML IJ SOLN
INTRAMUSCULAR | Status: DC | PRN
  Administered 2022-02-03: 5 mg via INTRAVENOUS

## 2022-02-03 MED ORDER — SODIUM CHLORIDE 0.9 % IV SOLN
INTRAVENOUS | Status: DC | PRN
  Administered 2022-02-03: 37 mL

## 2022-02-03 MED ORDER — CHLORHEXIDINE GLUCONATE 0.12 % MT SOLN
OROMUCOSAL | Status: AC
  Administered 2022-02-03: 15 mL via OROMUCOSAL
  Filled 2022-02-03: qty 15

## 2022-02-03 MED ORDER — LIDOCAINE 2% (20 MG/ML) 5 ML SYRINGE
INTRAMUSCULAR | Status: DC | PRN
  Administered 2022-02-03: 100 mg via INTRAVENOUS

## 2022-02-03 SURGICAL SUPPLY — 40 items
APPLIER CLIP ROT 10 11.4 M/L (STAPLE) ×1
BAG COUNTER SPONGE SURGICOUNT (BAG) ×1 IMPLANT
BLADE CLIPPER SURG (BLADE) IMPLANT
CANISTER SUCT 3000ML PPV (MISCELLANEOUS) ×1 IMPLANT
CHLORAPREP W/TINT 26 (MISCELLANEOUS) ×1 IMPLANT
CLIP APPLIE ROT 10 11.4 M/L (STAPLE) ×1 IMPLANT
COVER MAYO STAND STRL (DRAPES) ×1 IMPLANT
COVER SURGICAL LIGHT HANDLE (MISCELLANEOUS) ×1 IMPLANT
DERMABOND ADVANCED .7 DNX12 (GAUZE/BANDAGES/DRESSINGS) ×1 IMPLANT
DRAPE C-ARM 42X120 X-RAY (DRAPES) ×1 IMPLANT
ELECT REM PT RETURN 9FT ADLT (ELECTROSURGICAL) ×1
ELECTRODE REM PT RTRN 9FT ADLT (ELECTROSURGICAL) ×1 IMPLANT
GLOVE BIO SURGEON STRL SZ8 (GLOVE) ×1 IMPLANT
GLOVE BIOGEL PI IND STRL 8 (GLOVE) ×1 IMPLANT
GOWN STRL REUS W/ TWL LRG LVL3 (GOWN DISPOSABLE) ×2 IMPLANT
GOWN STRL REUS W/ TWL XL LVL3 (GOWN DISPOSABLE) ×1 IMPLANT
GOWN STRL REUS W/TWL LRG LVL3 (GOWN DISPOSABLE) ×2
GOWN STRL REUS W/TWL XL LVL3 (GOWN DISPOSABLE) ×1
KIT BASIN OR (CUSTOM PROCEDURE TRAY) ×1 IMPLANT
KIT TURNOVER KIT B (KITS) ×1 IMPLANT
NS IRRIG 1000ML POUR BTL (IV SOLUTION) ×1 IMPLANT
PAD ARMBOARD 7.5X6 YLW CONV (MISCELLANEOUS) ×1 IMPLANT
POUCH RETRIEVAL ECOSAC 10 (ENDOMECHANICALS) ×1 IMPLANT
POUCH RETRIEVAL ECOSAC 10MM (ENDOMECHANICALS) ×1
SCISSORS LAP 5X35 DISP (ENDOMECHANICALS) ×1 IMPLANT
SET CHOLANGIOGRAPH 5 50 .035 (SET/KITS/TRAYS/PACK) ×1 IMPLANT
SET IRRIG TUBING LAPAROSCOPIC (IRRIGATION / IRRIGATOR) ×1 IMPLANT
SET TUBE SMOKE EVAC HIGH FLOW (TUBING) ×1 IMPLANT
SLEEVE ENDOPATH XCEL 5M (ENDOMECHANICALS) ×1 IMPLANT
SLEEVE Z-THREAD 5X100MM (TROCAR) IMPLANT
SPECIMEN JAR SMALL (MISCELLANEOUS) ×1 IMPLANT
SUT MNCRL AB 4-0 PS2 18 (SUTURE) ×1 IMPLANT
TOWEL GREEN STERILE (TOWEL DISPOSABLE) ×1 IMPLANT
TOWEL GREEN STERILE FF (TOWEL DISPOSABLE) ×1 IMPLANT
TRAY LAPAROSCOPIC MC (CUSTOM PROCEDURE TRAY) ×1 IMPLANT
TROCAR BALLN 12MMX100 BLUNT (TROCAR) IMPLANT
TROCAR XCEL NON-BLD 11X100MML (ENDOMECHANICALS) ×1 IMPLANT
TROCAR Z-THREAD OPTICAL 5X100M (TROCAR) ×1 IMPLANT
WARMER LAPAROSCOPE (MISCELLANEOUS) ×1 IMPLANT
WATER STERILE IRR 1000ML POUR (IV SOLUTION) ×1 IMPLANT

## 2022-02-03 NOTE — ED Notes (Signed)
Patient ambulated to the bathroom. Patient is now back in bed resting.

## 2022-02-03 NOTE — Anesthesia Preprocedure Evaluation (Signed)
Anesthesia Evaluation  Patient identified by MRN, date of birth, ID band Patient awake    Reviewed: Allergy & Precautions, H&P , NPO status , Patient's Chart, lab work & pertinent test results  Airway Mallampati: II  TM Distance: >3 FB Neck ROM: Full    Dental no notable dental hx.    Pulmonary neg pulmonary ROS   Pulmonary exam normal breath sounds clear to auscultation       Cardiovascular negative cardio ROS Normal cardiovascular exam Rhythm:Regular Rate:Normal     Neuro/Psych negative neurological ROS  negative psych ROS   GI/Hepatic negative GI ROS, Neg liver ROS,,,  Endo/Other  negative endocrine ROS    Renal/GU negative Renal ROS  negative genitourinary   Musculoskeletal negative musculoskeletal ROS (+)    Abdominal   Peds negative pediatric ROS (+)  Hematology negative hematology ROS (+)   Anesthesia Other Findings   Reproductive/Obstetrics negative OB ROS                             Anesthesia Physical Anesthesia Plan  ASA: 2  Anesthesia Plan: General   Post-op Pain Management: Minimal or no pain anticipated   Induction: Intravenous  PONV Risk Score and Plan: 3 and Ondansetron, Dexamethasone, Midazolam and Treatment may vary due to age or medical condition  Airway Management Planned: Oral ETT  Additional Equipment:   Intra-op Plan:   Post-operative Plan: Extubation in OR  Informed Consent: I have reviewed the patients History and Physical, chart, labs and discussed the procedure including the risks, benefits and alternatives for the proposed anesthesia with the patient or authorized representative who has indicated his/her understanding and acceptance.     Dental advisory given  Plan Discussed with: CRNA and Surgeon  Anesthesia Plan Comments:        Anesthesia Quick Evaluation  

## 2022-02-03 NOTE — H&P (View-Only) (Signed)
[name]: 2:25 PM 02/03/2022  LOS: 0 days    Referring Provider: Dr Brantley Stage  Primary Care Physician:  Unk Pinto, MD Primary Gastroenterologist:  unassigned     Reason for Consultation:  IOC + at lap chole today.     HPI: Beth Thomas is a 26 y.o. female.  PMH obesity.  Low back pain.  Scoliosis.  Vit D, B12, iron deficiency.  Anxiety.  Mixed HLD. Migraine HA.   S/p tonsil/adenoidectomy.     Presented to the ED 2 days ago.  Developed upper abdominal pain, R >> L, the previous night after eating chili.  There was associated nausea and nonbloody emesis.  No relief with bismuth.  At ED ultrasound showed gallstones but no cholecystitis changes.  Gallbladder polyp.  6.8 mm CBD with normal intrahepatic duct diameters.  AST/ALT 113/51.  T. bili 0.7.  Alk phos 43.  WBCs 15.6. By the time ED staff examined her she was symptom-free and discharged home. Patient reported similar episodes beginning mid October, ~ 4 weeks prior.   Unfortunately symptoms returned again the next morning and she returned to the ED.  WBCs improved at 11.8.  T. bili 1.3.  Alk phos 71.  AST/ALT 88/80.  General surgery consulted.  Underwent Lap chole today.  Surgery uncomplicated.  At [facility] had obstruction at distal duct but no obvious stone, mass.      Rare ETOH.  Works as a Pharmacist, hospital in [employer].  No tobacco.  No ilicit drugs.  Single.  Lives in [place].  Her contacts are her mother and father.  Mother's cell phone is [phone] Family Hx: cholecystectomy in mom in her 24s or 31s.  DM in mom.  HLD in dad.  Htn in both parents.      Past Medical History:  Diagnosis Date   Allergy    Unspecified vitamin D deficiency     Past Surgical History:  Procedure Laterality Date   TONSILECTOMY/ADENOIDECTOMY WITH  MYRINGOTOMY  01/2016    Prior to Admission medications   Medication Sig Start Date End Date Taking? Authorizing Provider  Cetirizine HCl 10 MG CAPS Take 10 mg by mouth daily.   Yes [provider]  citalopram (CELEXA) 20 MG tablet TAKE 1 TABLET BY MOUTH DAILY FOR MOOD Patient taking differently: Take 20 mg by mouth daily. FOR MOOD 11/30/20  Yes Liane Comber, NP  Cyanocobalamin (B-12 PO) Take 1 capsule by mouth daily.   Yes [provider]  drospirenone-ethinyl estradiol (SYEDA) 3-0.03 MG tablet Take 1 tablet Daily 01/05/22  Yes Cranford, Tonya, NP  rosuvastatin (CRESTOR) 10 MG tablet Take 1 tablet (10 mg total) by mouth daily. Patient taking differently: Take 5 mg by mouth daily. 11/05/21 11/05/22 Yes Alycia Rossetti, NP  Vitamin D, Cholecalciferol, 25 MCG (1000 UT) TABS Take 25 mcg by mouth daily.   Yes [provider]  Liraglutide -Weight Management (SAXENDA) 18 MG/3ML SOPN Inject 3 mg into the skin daily. Inject one pen SQ daily for weight loss Patient not taking: Reported on 12/25/2021 05/19/21  Liane Comber, NP  methocarbamol (ROBAXIN) 750 MG tablet Take 1 tablet (750 mg total) by mouth every 8 (eight) hours as needed for muscle spasms. Patient not taking: Reported on 12/25/2021 10/01/21   Mcarthur Rossetti, MD  ondansetron (ZOFRAN) 4 MG tablet Take 1 tablet (4 mg total) by mouth every 6 (six) hours. 02/01/22   Blanchie Dessert, MD  oxyCODONE-acetaminophen (PERCOCET/ROXICET) 5-325 MG tablet Take 1 tablet by mouth every 6 (six) hours as needed for severe pain. 02/01/22   Blanchie Dessert, MD  penicillin v potassium (VEETID) 500 MG tablet Take 1 tablet (500 mg total) by mouth 3 (three) times daily. Patient not taking: Reported on 02/02/2022 12/25/21   Darrol Jump, NP  phentermine (ADIPEX-P) 37.5 MG tablet Take 1/2 to 1 tablet every morning for dieting & weightloss Patient not taking: Reported on 02/02/2022 07/16/21   Alycia Rossetti, NP     Scheduled Meds:  Doug Sou Hold] citalopram  20 mg Oral Daily   [MAR Hold] enoxaparin (LOVENOX) injection  40 mg Subcutaneous Q24H   [MAR Hold] pantoprazole (PROTONIX) IV  40 mg Intravenous QHS   Infusions:  sodium chloride 125 mL/hr at 02/03/22 0240   [MAR Hold] cefTRIAXone (ROCEPHIN)  IV Stopped (02/02/22 1721)   lactated ringers     [MAR Hold] methocarbamol (ROBAXIN) IV     PRN Meds: [MAR Hold] acetaminophen **OR** [MAR Hold] acetaminophen, [MAR Hold] diphenhydrAMINE **OR** [MAR Hold] diphenhydrAMINE, [MAR Hold] hydrALAZINE, HYDROmorphone (DILAUDID) injection, ketorolac, [MAR Hold] methocarbamol **OR** [MAR Hold] methocarbamol (ROBAXIN) IV, [MAR Hold]  morphine injection, [MAR Hold] ondansetron **OR** [MAR Hold] ondansetron (ZOFRAN) IV, ondansetron (ZOFRAN) IV, oxyCODONE **OR** oxyCODONE, [MAR Hold] oxyCODONE, [MAR Hold] polyethylene glycol, [MAR Hold] prochlorperazine **OR** [MAR Hold] prochlorperazine, [MAR Hold] simethicone   Allergies as of 02/02/2022 - Review Complete 02/02/2022  Allergen Reaction Noted   Lactose intolerance (gi) Other (See Comments) 02/02/2022    Family History  Problem Relation Age of Onset   Hyperlipidemia Mother    Hypertension Mother    Diabetes Mother    Hypertension Father    Hyperlipidemia Father     Social History   Socioeconomic History   Marital status: Single    Spouse name: Not on file   Number of children: Not on file   Years of education: Not on file   Highest education level: Not on file  Occupational History   Not on file  Tobacco Use   Smoking status: Never    Passive exposure: Yes   Smokeless tobacco: Never  Substance and Sexual Activity   Alcohol use: No    Alcohol/week: 0.0 standard drinks of alcohol   Drug use: No   Sexual activity: Yes    Partners: Male    Birth control/protection: Pill  Other Topics Concern   Not on file  Social History Narrative   Not on file   Social Determinants of Health   Financial  Resource Strain: Not on file  Food Insecurity: Not on file  Transportation Needs: Not on file  Physical Activity: Not on file  Stress: Not on file  Social Connections: Not on file  Intimate Partner Violence: Not on file    REVIEW OF SYSTEMS: Constitutional: No weakness, no fatigue. ENT:  No nose bleeds Pulm: No shortness of breath.  No cough. CV:  No palpitations, no LE edema.  PO angina, chest pressure. GU:  No hematuria, no frequency no dark urine. GI: See HPI. Heme: No unusual bleeding or bruising. Transfusions: None Neuro:  No headaches, no  peripheral tingling or numbness Derm:  No itching, no rash or sores.  Endocrine:  No sweats or chills.  No polyuria or dysuria Immunization: Reviewed. Travel:  not queried.     PHYSICAL EXAM: Vital signs in last 24 hours: Vitals:   02/03/22 1140 02/03/22 1345  BP: 111/70 (!) 108/52  Pulse: 63 71  Resp: 18 (!) 22  Temp: 98.5 F (36.9 C) 97.9 F (36.6 C)  SpO2: 99% 94%   Wt Readings from Last 3 Encounters:  02/02/22 79.8 kg  12/25/21 79.8 kg  11/04/21 78.2 kg    General: Seen in PACU.  Sedated but arousable. Head: No facial asymmetry or swelling.  No signs of head trauma. Eyes: Anicteric.  Conjunctiva pink. Ears: No hearing deficit Nose: No discharge or congestion Mouth: Good dentition.  Oropharynx moist, pink, clear. Neck: No JVD. Lungs: No labored breathing or cough.  Lungs clear bilaterally Heart: RRR. Abdomen: Soft.  Surgical incisions intact with glue.  Bowel sounds absent.  No distention.  Applied only very light pressure and did not elicit tenderness. Rectal: Deferred Musc/Skeltl: No joint redness, swelling or gross deformity Extremities: No CCE. Neurologic: Drowsy.  No overt confusion.  Moves all 4 limbs. Skin: No jaundice Nodes: No cervical adenopathy Psych:  post sedation and calm.  Not speaking a lot.  Intake/Output from previous day: 11/20 0701 - 11/21 0700 In: 100 [IV Piggyback:100] Out: -   Intake/Output this shift: Total I/O In: 700 [I.V.:700] Out: 15 [Blood:15]  LAB RESULTS: Recent Labs    02/01/22 0337 02/02/22 1247 02/03/22 0549  WBC 15.6* 11.8* 5.7  HGB 13.0 12.7 11.2*  HCT 39.6 39.3 34.3*  PLT 272 275 209   BMET Lab Results  Component Value Date   NA 139 02/03/2022   NA 136 02/02/2022   NA 136 02/01/2022   K 3.7 02/03/2022   K 3.6 02/02/2022   K 3.9 02/01/2022   CL 104 02/03/2022   CL 100 02/02/2022   CL 102 02/01/2022   CO2 24 02/03/2022   CO2 22 02/02/2022   CO2 24 02/01/2022   GLUCOSE 96 02/03/2022   GLUCOSE 118 (H) 02/02/2022   GLUCOSE 131 (H) 02/01/2022   BUN 6 02/03/2022   BUN 9 02/02/2022   BUN 8 02/01/2022   CREATININE 0.78 02/03/2022   CREATININE 0.74 02/02/2022   CREATININE 0.79 02/01/2022   CALCIUM 8.4 (L) 02/03/2022   CALCIUM 9.1 02/02/2022   CALCIUM 9.3 02/01/2022   LFT Recent Labs    02/01/22 0337 02/02/22 1247 02/03/22 0549  PROT 7.2 7.4 6.0*  ALBUMIN 3.5 3.7 2.9*  AST 113* 88* 54*  ALT 51* 80* 64*  ALKPHOS 43 71 59  BILITOT 0.7 1.3* 0.4   PT/INR No results found for: "INR", "PROTIME" Hepatitis Panel No results for input(s): "HEPBSAG", "HCVAB", "HEPAIGM", "HEPBIGM" in the last 72 hours. C-Diff No components found for: "CDIFF" Lipase     Component Value Date/Time   LIPASE 29 02/02/2022 1247    Drugs of Abuse  No results found for: "LABOPIA", "COCAINSCRNUR", "LABBENZ", "AMPHETMU", "THCU", "LABBARB"   RADIOLOGY STUDIES: DG Cholangiogram Operative  Result Date: 02/03/2022 CLINICAL DATA:  26-year-old female history of cholelithiasis undergoing cholecystectomy. EXAM: INTRAOPERATIVE CHOLANGIOGRAM TECHNIQUE: Cholangiographic images from the C-arm fluoroscopic device were submitted for interpretation post-operatively. Please see the procedural report for the amount of contrast and the fluoroscopy time utilized. COMPARISON:  Abdominal ultrasound from 02/01/2022 FINDINGS: Intraoperative antegrade injection via the  cystic duct which opacifies the common bile duct and central   portions of the intrahepatic biliary tree. Contrast is not visualized flowing freely into the duodenum. There are no filling defects. Moderate extrahepatic biliary ductal dilation. No apparent anomalous anatomical configuration of the biliary tree. IMPRESSION: Moderate extrahepatic biliary ductal dilation without evidence of passage through the ampulla into the duodenum. Concern for distal common bile duct obstruction secondary to choledocholithiasis. Consider ERCP for further characterization. Ruthann Cancer, MD [facility] Electronically Signed   By: Ruthann Cancer M.D.   On: 02/03/2022 13:59      IMPRESSION:     Obstruction at distal CBD at IOC/lap chole today.  Elevated transaminases improving over last few days but normal alk phos and minimally, now resolved, elevation T. bili    PLAN:       ERCP? , TBD by Dr Rush Landmark.  For now have obtained a slot for tomorrow at 3 PM has daily Rocephin ordered so will not order additional antibiotics.   Azucena Freed  02/03/2022, 2:25 PM Phone [phone]  I have taken an interval history, thoroughly reviewed the chart and examined the patient. I agree with the Advanced Practitioner's note, impression and recommendations, and have recorded additional findings, impressions and recommendations below. I performed a substantive portion of this encounter (>50% time spent), including a complete performance of the medical decision making.  My additional thoughts are as follows:  Personal review of the good-quality cholangiogram images shows no passage of contrast into the duodenum.  No clear stone is seen on the images, though there is the suggestion of a concave meniscus at the cutoff of contrast flow.  Cannot determine if this is a stone or spasm.  Curiously, her LFT pattern is not consistent with a high-grade biliary  obstruction.  Nevertheless, this warrants definitive investigation and management. Awaiting review by Dr. Rush Landmark, though I suspect he will agree with the need for ERCP tomorrow.  I had a discussion with [place] on her parents (who were at the bedside) about the overall scenario and about an ERCP in particular.  Risks and benefits were reviewed.  The benefits and risks of the planned procedure were described in detail with the patient or (when appropriate) their health care proxy.  Risks were outlined as including, but not limited to, bleeding, infection, perforation, adverse medication reaction leading to cardiac or pulmonary decompensation, pancreatitis (if ERCP).  The limitation of incomplete mucosal visualization was also discussed.  No guarantees or warranties were given.  N.p.o. after midnight, we will check back with her in the morning with a definitive plan.  As it stands now, we have a tentative slot held tomorrow afternoon.   Nelida Meuse III Office:[phone]

## 2022-02-03 NOTE — Consult Note (Addendum)
[name]: 2:25 PM 02/03/2022  LOS: 0 days    Referring Provider: Dr Brantley Stage  Primary Care Physician:  Unk Pinto, MD Primary Gastroenterologist:  unassigned     Reason for Consultation:  IOC + at lap chole today.     HPI: Beth Thomas is a 26 y.o. female.  PMH obesity.  Low back pain.  Scoliosis.  Vit D, B12, iron deficiency.  Anxiety.  Mixed HLD. Migraine HA.   S/p tonsil/adenoidectomy.     Presented to the ED 2 days ago.  Developed upper abdominal pain, R >> L, the previous night after eating chili.  There was associated nausea and nonbloody emesis.  No relief with bismuth.  At ED ultrasound showed gallstones but no cholecystitis changes.  Gallbladder polyp.  6.8 mm CBD with normal intrahepatic duct diameters.  AST/ALT 113/51.  T. bili 0.7.  Alk phos 43.  WBCs 15.6. By the time ED staff examined her she was symptom-free and discharged home. Patient reported similar episodes beginning mid October, ~ 4 weeks prior.   Unfortunately symptoms returned again the next morning and she returned to the ED.  WBCs improved at 11.8.  T. bili 1.3.  Alk phos 71.  AST/ALT 88/80.  General surgery consulted.  Underwent Lap chole today.  Surgery uncomplicated.  At [facility] had obstruction at distal duct but no obvious stone, mass.      Rare ETOH.  Works as a Pharmacist, hospital in [employer].  No tobacco.  No ilicit drugs.  Single.  Lives in [place].  Her contacts are her mother and father.  Mother's cell phone is [phone] Family Hx: cholecystectomy in mom in her 24s or 31s.  DM in mom.  HLD in dad.  Htn in both parents.      Past Medical History:  Diagnosis Date   Allergy    Unspecified vitamin D deficiency     Past Surgical History:  Procedure Laterality Date   TONSILECTOMY/ADENOIDECTOMY WITH  MYRINGOTOMY  01/2016    Prior to Admission medications   Medication Sig Start Date End Date Taking? Authorizing Provider  Cetirizine HCl 10 MG CAPS Take 10 mg by mouth daily.   Yes [provider]  citalopram (CELEXA) 20 MG tablet TAKE 1 TABLET BY MOUTH DAILY FOR MOOD Patient taking differently: Take 20 mg by mouth daily. FOR MOOD 11/30/20  Yes Liane Comber, NP  Cyanocobalamin (B-12 PO) Take 1 capsule by mouth daily.   Yes [provider]  drospirenone-ethinyl estradiol (SYEDA) 3-0.03 MG tablet Take 1 tablet Daily 01/05/22  Yes Cranford, Tonya, NP  rosuvastatin (CRESTOR) 10 MG tablet Take 1 tablet (10 mg total) by mouth daily. Patient taking differently: Take 5 mg by mouth daily. 11/05/21 11/05/22 Yes Alycia Rossetti, NP  Vitamin D, Cholecalciferol, 25 MCG (1000 UT) TABS Take 25 mcg by mouth daily.   Yes [provider]  Liraglutide -Weight Management (SAXENDA) 18 MG/3ML SOPN Inject 3 mg into the skin daily. Inject one pen SQ daily for weight loss Patient not taking: Reported on 12/25/2021 05/19/21  Liane Comber, NP  methocarbamol (ROBAXIN) 750 MG tablet Take 1 tablet (750 mg total) by mouth every 8 (eight) hours as needed for muscle spasms. Patient not taking: Reported on 12/25/2021 10/01/21   Mcarthur Rossetti, MD  ondansetron (ZOFRAN) 4 MG tablet Take 1 tablet (4 mg total) by mouth every 6 (six) hours. 02/01/22   Blanchie Dessert, MD  oxyCODONE-acetaminophen (PERCOCET/ROXICET) 5-325 MG tablet Take 1 tablet by mouth every 6 (six) hours as needed for severe pain. 02/01/22   Blanchie Dessert, MD  penicillin v potassium (VEETID) 500 MG tablet Take 1 tablet (500 mg total) by mouth 3 (three) times daily. Patient not taking: Reported on 02/02/2022 12/25/21   Darrol Jump, NP  phentermine (ADIPEX-P) 37.5 MG tablet Take 1/2 to 1 tablet every morning for dieting & weightloss Patient not taking: Reported on 02/02/2022 07/16/21   Alycia Rossetti, NP     Scheduled Meds:  Doug Sou Hold] citalopram  20 mg Oral Daily   [MAR Hold] enoxaparin (LOVENOX) injection  40 mg Subcutaneous Q24H   [MAR Hold] pantoprazole (PROTONIX) IV  40 mg Intravenous QHS   Infusions:  sodium chloride 125 mL/hr at 02/03/22 0240   [MAR Hold] cefTRIAXone (ROCEPHIN)  IV Stopped (02/02/22 1721)   lactated ringers     [MAR Hold] methocarbamol (ROBAXIN) IV     PRN Meds: [MAR Hold] acetaminophen **OR** [MAR Hold] acetaminophen, [MAR Hold] diphenhydrAMINE **OR** [MAR Hold] diphenhydrAMINE, [MAR Hold] hydrALAZINE, HYDROmorphone (DILAUDID) injection, ketorolac, [MAR Hold] methocarbamol **OR** [MAR Hold] methocarbamol (ROBAXIN) IV, [MAR Hold]  morphine injection, [MAR Hold] ondansetron **OR** [MAR Hold] ondansetron (ZOFRAN) IV, ondansetron (ZOFRAN) IV, oxyCODONE **OR** oxyCODONE, [MAR Hold] oxyCODONE, [MAR Hold] polyethylene glycol, [MAR Hold] prochlorperazine **OR** [MAR Hold] prochlorperazine, [MAR Hold] simethicone   Allergies as of 02/02/2022 - Review Complete 02/02/2022  Allergen Reaction Noted   Lactose intolerance (gi) Other (See Comments) 02/02/2022    Family History  Problem Relation Age of Onset   Hyperlipidemia Mother    Hypertension Mother    Diabetes Mother    Hypertension Father    Hyperlipidemia Father     Social History   Socioeconomic History   Marital status: Single    Spouse name: Not on file   Number of children: Not on file   Years of education: Not on file   Highest education level: Not on file  Occupational History   Not on file  Tobacco Use   Smoking status: Never    Passive exposure: Yes   Smokeless tobacco: Never  Substance and Sexual Activity   Alcohol use: No    Alcohol/week: 0.0 standard drinks of alcohol   Drug use: No   Sexual activity: Yes    Partners: Male    Birth control/protection: Pill  Other Topics Concern   Not on file  Social History Narrative   Not on file   Social Determinants of Health   Financial  Resource Strain: Not on file  Food Insecurity: Not on file  Transportation Needs: Not on file  Physical Activity: Not on file  Stress: Not on file  Social Connections: Not on file  Intimate Partner Violence: Not on file    REVIEW OF SYSTEMS: Constitutional: No weakness, no fatigue. ENT:  No nose bleeds Pulm: No shortness of breath.  No cough. CV:  No palpitations, no LE edema.  PO angina, chest pressure. GU:  No hematuria, no frequency no dark urine. GI: See HPI. Heme: No unusual bleeding or bruising. Transfusions: None Neuro:  No headaches, no  peripheral tingling or numbness Derm:  No itching, no rash or sores.  Endocrine:  No sweats or chills.  No polyuria or dysuria Immunization: Reviewed. Travel:  not queried.     PHYSICAL EXAM: Vital signs in last 24 hours: Vitals:   02/03/22 1140 02/03/22 1345  BP: 111/70 (!) 108/52  Pulse: 63 71  Resp: 18 (!) 22  Temp: 98.5 F (36.9 C) 97.9 F (36.6 C)  SpO2: 99% 94%   Wt Readings from Last 3 Encounters:  02/02/22 79.8 kg  12/25/21 79.8 kg  11/04/21 78.2 kg    General: Seen in PACU.  Sedated but arousable. Head: No facial asymmetry or swelling.  No signs of head trauma. Eyes: Anicteric.  Conjunctiva pink. Ears: No hearing deficit Nose: No discharge or congestion Mouth: Good dentition.  Oropharynx moist, pink, clear. Neck: No JVD. Lungs: No labored breathing or cough.  Lungs clear bilaterally Heart: RRR. Abdomen: Soft.  Surgical incisions intact with glue.  Bowel sounds absent.  No distention.  Applied only very light pressure and did not elicit tenderness. Rectal: Deferred Musc/Skeltl: No joint redness, swelling or gross deformity Extremities: No CCE. Neurologic: Drowsy.  No overt confusion.  Moves all 4 limbs. Skin: No jaundice Nodes: No cervical adenopathy Psych:  post sedation and calm.  Not speaking a lot.  Intake/Output from previous day: 11/20 0701 - 11/21 0700 In: 100 [IV Piggyback:100] Out: -   Intake/Output this shift: Total I/O In: 700 [I.V.:700] Out: 15 [Blood:15]  LAB RESULTS: Recent Labs    02/01/22 0337 02/02/22 1247 02/03/22 0549  WBC 15.6* 11.8* 5.7  HGB 13.0 12.7 11.2*  HCT 39.6 39.3 34.3*  PLT 272 275 209   BMET Lab Results  Component Value Date   NA 139 02/03/2022   NA 136 02/02/2022   NA 136 02/01/2022   K 3.7 02/03/2022   K 3.6 02/02/2022   K 3.9 02/01/2022   CL 104 02/03/2022   CL 100 02/02/2022   CL 102 02/01/2022   CO2 24 02/03/2022   CO2 22 02/02/2022   CO2 24 02/01/2022   GLUCOSE 96 02/03/2022   GLUCOSE 118 (H) 02/02/2022   GLUCOSE 131 (H) 02/01/2022   BUN 6 02/03/2022   BUN 9 02/02/2022   BUN 8 02/01/2022   CREATININE 0.78 02/03/2022   CREATININE 0.74 02/02/2022   CREATININE 0.79 02/01/2022   CALCIUM 8.4 (L) 02/03/2022   CALCIUM 9.1 02/02/2022   CALCIUM 9.3 02/01/2022   LFT Recent Labs    02/01/22 0337 02/02/22 1247 02/03/22 0549  PROT 7.2 7.4 6.0*  ALBUMIN 3.5 3.7 2.9*  AST 113* 88* 54*  ALT 51* 80* 64*  ALKPHOS 43 71 59  BILITOT 0.7 1.3* 0.4   PT/INR No results found for: "INR", "PROTIME" Hepatitis Panel No results for input(s): "HEPBSAG", "HCVAB", "HEPAIGM", "HEPBIGM" in the last 72 hours. C-Diff No components found for: "CDIFF" Lipase     Component Value Date/Time   LIPASE 29 02/02/2022 1247    Drugs of Abuse  No results found for: "LABOPIA", "COCAINSCRNUR", "LABBENZ", "AMPHETMU", "THCU", "LABBARB"   RADIOLOGY STUDIES: DG Cholangiogram Operative  Result Date: 02/03/2022 CLINICAL DATA:  26 year old female history of cholelithiasis undergoing cholecystectomy. EXAM: INTRAOPERATIVE CHOLANGIOGRAM TECHNIQUE: Cholangiographic images from the C-arm fluoroscopic device were submitted for interpretation post-operatively. Please see the procedural report for the amount of contrast and the fluoroscopy time utilized. COMPARISON:  Abdominal ultrasound from 02/01/2022 FINDINGS: Intraoperative antegrade injection via the  cystic duct which opacifies the common bile duct and central  portions of the intrahepatic biliary tree. Contrast is not visualized flowing freely into the duodenum. There are no filling defects. Moderate extrahepatic biliary ductal dilation. No apparent anomalous anatomical configuration of the biliary tree. IMPRESSION: Moderate extrahepatic biliary ductal dilation without evidence of passage through the ampulla into the duodenum. Concern for distal common bile duct obstruction secondary to choledocholithiasis. Consider ERCP for further characterization. Ruthann Cancer, MD [facility] Electronically Signed   By: Ruthann Cancer M.D.   On: 02/03/2022 13:59      IMPRESSION:     Obstruction at distal CBD at IOC/lap chole today.  Elevated transaminases improving over last few days but normal alk phos and minimally, now resolved, elevation T. bili    PLAN:       ERCP? , TBD by Dr Rush Landmark.  For now have obtained a slot for tomorrow at 3 PM has daily Rocephin ordered so will not order additional antibiotics.   Azucena Freed  02/03/2022, 2:25 PM Phone [phone]  I have taken an interval history, thoroughly reviewed the chart and examined the patient. I agree with the Advanced Practitioner's note, impression and recommendations, and have recorded additional findings, impressions and recommendations below. I performed a substantive portion of this encounter (>50% time spent), including a complete performance of the medical decision making.  My additional thoughts are as follows:  Personal review of the good-quality cholangiogram images shows no passage of contrast into the duodenum.  No clear stone is seen on the images, though there is the suggestion of a concave meniscus at the cutoff of contrast flow.  Cannot determine if this is a stone or spasm.  Curiously, her LFT pattern is not consistent with a high-grade biliary  obstruction.  Nevertheless, this warrants definitive investigation and management. Awaiting review by Dr. Rush Landmark, though I suspect he will agree with the need for ERCP tomorrow.  I had a discussion with [place] on her parents (who were at the bedside) about the overall scenario and about an ERCP in particular.  Risks and benefits were reviewed.  The benefits and risks of the planned procedure were described in detail with the patient or (when appropriate) their health care proxy.  Risks were outlined as including, but not limited to, bleeding, infection, perforation, adverse medication reaction leading to cardiac or pulmonary decompensation, pancreatitis (if ERCP).  The limitation of incomplete mucosal visualization was also discussed.  No guarantees or warranties were given.  N.p.o. after midnight, we will check back with her in the morning with a definitive plan.  As it stands now, we have a tentative slot held tomorrow afternoon.   Nelida Meuse III Office:[phone]

## 2022-02-03 NOTE — ED Notes (Signed)
Report called to Pre op RN. Will transport patient upstairs at this time.

## 2022-02-03 NOTE — Transfer of Care (Signed)
Immediate Anesthesia Transfer of Care Note  Patient: Beth Thomas  Procedure(s) Performed: LAPAROSCOPIC CHOLECYSTECTOMY WITH INTRAOPERATIVE CHOLANGIOGRAM  Patient Location: PACU  Anesthesia Type:General  Level of Consciousness: drowsy  Airway & Oxygen Therapy: Patient Spontanous Breathing and Patient connected to nasal cannula oxygen  Post-op Assessment: Report given to RN and Post -op Vital signs reviewed and stable  Post vital signs: Reviewed and stable  Last Vitals:  Vitals Value Taken Time  BP 108/52 02/03/22 1345  Temp    Pulse 70 02/03/22 1349  Resp 23 02/03/22 1349  SpO2 95 % 02/03/22 1349  Vitals shown include unvalidated device data.  Last Pain:  Vitals:   02/03/22 1140  TempSrc: Oral  PainSc: 0-No pain         Complications: No notable events documented.

## 2022-02-03 NOTE — Anesthesia Postprocedure Evaluation (Signed)
Anesthesia Post Note  Patient: Beth Thomas  Procedure(s) Performed: LAPAROSCOPIC CHOLECYSTECTOMY WITH INTRAOPERATIVE CHOLANGIOGRAM     Patient location during evaluation: PACU Anesthesia Type: General Level of consciousness: awake and alert Pain management: pain level controlled Vital Signs Assessment: post-procedure vital signs reviewed and stable Respiratory status: spontaneous breathing, nonlabored ventilation, respiratory function stable and patient connected to nasal cannula oxygen Cardiovascular status: blood pressure returned to baseline and stable Postop Assessment: no apparent nausea or vomiting Anesthetic complications: no  No notable events documented.  Last Vitals:  Vitals:   02/03/22 1140 02/03/22 1345  BP: 111/70 (!) 108/52  Pulse: 63 71  Resp: 18 (!) 22  Temp: 36.9 C 36.6 C  SpO2: 99% 94%    Last Pain:  Vitals:   02/03/22 1345  TempSrc:   PainSc: 0-No pain                 Lakena Sparlin S

## 2022-02-03 NOTE — Op Note (Signed)
Laparoscopic Cholecystectomy with IOC Procedure Note  Indications: This patient presents with symptomatic gallbladder disease and will undergo laparoscopic cholecystectomy.  Pre-operative Diagnosis: Calculus of gallbladder with acute cholecystitis, without mention of obstruction  Post-operative Diagnosis: Calculus of gallbladder and bile duct with acute cholecystitis, without mention of obstruction  Surgeon: Dortha Schwalbe  MD   Assistants: Dr Lu Duffel MD   Anesthesia: General endotracheal anesthesia and Local anesthesia 0.25.% bupivacaine, with epinephrine  ASA Class: 1  Procedure Details  The patient was seen again in the Holding Room. The risks, benefits, complications, treatment options, and expected outcomes were discussed with the patient. The possibilities of reaction to medication, pulmonary aspiration, perforation of viscus, bleeding, recurrent infection, finding a normal gallbladder, the need for additional procedures, failure to diagnose a condition, the possible need to convert to an open procedure, and creating a complication requiring transfusion or operation were discussed with the patient. The patient and/or family concurred with the proposed plan, giving informed consent. The site of surgery properly noted/marked. The patient was taken to Operating Room, identified as Beth Thomas and the procedure verified as Laparoscopic Cholecystectomy with Intraoperative Cholangiograms. A Time Out was held and the above information confirmed.  Prior to the induction of general anesthesia, antibiotic prophylaxis was administered. General endotracheal anesthesia was then administered and tolerated well. After the induction, the abdomen was prepped in the usual sterile fashion. The patient was positioned in the supine position with the left arm comfortably tucked, along with some reverse Trendelenburg.  Local anesthetic agent was injected into the skin near the umbilicus and an  incision made. The midline fascia was incised and the Hasson technique was used to introduce a 12 mm port under direct vision. It was secured with a figure of eight Vicryl suture placed in the usual fashion. Pneumoperitoneum was then created with CO2 and tolerated well without any adverse changes in the patient's vital signs. Additional trocars were introduced under direct vision with an 11 mm trocar in the epigastrium and 2 5 mm trocars in the right upper quadrant. All skin incisions were infiltrated with a local anesthetic agent before making the incision and placing the trocars.   The gallbladder was identified, the fundus grasped and retracted cephalad. Adhesions were lysed bluntly and with the electrocautery where indicated, taking care not to injure any adjacent organs or viscus. The infundibulum was grasped and retracted laterally, exposing the peritoneum overlying the triangle of Calot. This was then divided and exposed in a blunt fashion. The cystic duct was clearly identified and bluntly dissected circumferentially. The junctions of the gallbladder, cystic duct and common bile duct were clearly identified prior to the division of any linear structure.   An incision was made in the cystic duct and the cholangiogram catheter introduced. The catheter was secured using an endoclip. The study showed a distal obstruction.  Multiple attempts at irrigation were used to flush the obstruction but were unsuccessful. The remainder of the biliary tree was normal.  The catheter was then removed.   The cystic duct was then  ligated with surgical clips  on the patient side and  clipped on the gallbladder side and divided. The cystic artery was identified, dissected free, ligated with clips and divided as well. Posterior cystic artery clipped and divided.  The gallbladder was dissected from the liver bed in retrograde fashion with the electrocautery. The gallbladder was removed. The liver bed was irrigated and  inspected. Hemostasis was achieved with the electrocautery. Copious irrigation was utilized and  was repeatedly aspirated until clear all particulate matter. Hemostasis was achieved with no signs  Of bleeding or bile leakage.  Pneumoperitoneum was completely reduced after viewing removal of the trocars under direct vision. The wound was thoroughly irrigated and the fascia was then closed with a figure of eight suture; the skin was then closed with 4  -O monocryl  and a sterile dressing of Dermabond  was applied.  Instrument, sponge, and needle counts were correct at closure and at the conclusion of the case.   Findings: Cholecystitis with Cholelithiasis and possible choledocolithiasis  Estimated Blood Loss: less than 50 mL         Drains: none         Total IV Fluids: per record          Specimens: Gallbladder           Complications: None; patient tolerated the procedure well.         Disposition: PACU - hemodynamically stable.         Condition: stable

## 2022-02-03 NOTE — Anesthesia Procedure Notes (Signed)
Procedure Name: Intubation Date/Time: 02/03/2022 12:21 PM  Performed by: Waynard Edwards, CRNAPre-anesthesia Checklist: Patient identified, Emergency Drugs available, Suction available and Patient being monitored Patient Re-evaluated:Patient Re-evaluated prior to induction Oxygen Delivery Method: Circle system utilized Preoxygenation: Pre-oxygenation with 100% oxygen Induction Type: IV induction Ventilation: Mask ventilation without difficulty Laryngoscope Size: Miller and 2 Grade View: Grade I Tube type: Oral Tube size: 7.0 mm Number of attempts: 1 Airway Equipment and Method: Stylet Placement Confirmation: ETT inserted through vocal cords under direct vision, positive ETCO2 and breath sounds checked- equal and bilateral Secured at: 22 cm Tube secured with: Tape Dental Injury: Teeth and Oropharynx as per pre-operative assessment

## 2022-02-03 NOTE — Discharge Instructions (Signed)
[facility]  Please arrive at least 30 min before your appointment to complete your check in paperwork.  If you are unable to arrive 30 min prior to your appointment time we may have to cancel or reschedule you. LAPAROSCOPIC SURGERY: POST OP INSTRUCTIONS Always review your discharge instruction sheet given to you by the facility where your surgery was performed. IF YOU HAVE DISABILITY OR FAMILY LEAVE FORMS, YOU MUST BRING THEM TO THE OFFICE FOR PROCESSING.   DO NOT GIVE THEM TO YOUR DOCTOR.  PAIN CONTROL  First take acetaminophen (Tylenol) AND/or ibuprofen (Advil) to control your pain after surgery.  Follow directions on package.  Taking acetaminophen (Tylenol) and/or ibuprofen (Advil) regularly after surgery will help to control your pain and lower the amount of prescription pain medication you may need.  You should not take more than 4,000 mg (4 grams) of acetaminophen (Tylenol) in 24 hours.  You should not take ibuprofen (Advil), aleve, motrin, naprosyn or other NSAIDS if you have a history of stomach ulcers or chronic kidney disease.  A prescription for pain medication may be given to you upon discharge.  Take your pain medication as prescribed, if you still have uncontrolled pain after taking acetaminophen (Tylenol) or ibuprofen (Advil). Use ice packs to help control pain. If you need a refill on your pain medication, please contact your pharmacy.  They will contact our office to request authorization. Prescriptions will not be filled after 5pm or on week-ends.  HOME MEDICATIONS Take your usually prescribed medications unless otherwise directed.  DIET You should follow a light diet the first few days after arrival home.  Be sure to include lots of fluids daily. Avoid fatty, fried foods.   CONSTIPATION It is common to experience some constipation after surgery and if you are taking pain medication.  Increasing fluid intake and taking a stool softener (such as Colace)  will usually help or prevent this problem from occurring.  A mild laxative (Milk of Magnesia or Miralax) should be taken according to package instructions if there are no bowel movements after 48 hours.  WOUND/INCISION CARE Most patients will experience some swelling and bruising in the area of the incisions.  Ice packs will help.  Swelling and bruising can take several days to resolve.  Unless discharge instructions indicate otherwise, follow guidelines below  STERI-STRIPS - you may remove your outer bandages 48 hours after surgery, and you may shower at that time.  You have steri-strips (small skin tapes) in place directly over the incision.  These strips should be left on the skin for 7-10 days.   DERMABOND/SKIN GLUE - you may shower in 24 hours.  The glue will flake off over the next 2-3 weeks. Any sutures or staples will be removed at the office during your follow-up visit.  ACTIVITIES You may resume regular (light) daily activities beginning the next day--such as daily self-care, walking, climbing stairs--gradually increasing activities as tolerated.  You may have sexual intercourse when it is comfortable.  Refrain from any heavy lifting or straining until approved by your doctor. You may drive when you are no longer taking prescription pain medication, you can comfortably wear a seatbelt, and you can safely maneuver your car and apply brakes.  FOLLOW-UP You should see your doctor in the office for a follow-up appointment approximately 2-3 weeks after your surgery.  You should have been given your post-op/follow-up appointment when your surgery was scheduled.  If you did not receive a post-op/follow-up appointment, make sure   that you call for this appointment within a day or two after you arrive home to insure a convenient appointment time.  OTHER INSTRUCTIONS  WHEN TO CALL YOUR DOCTOR: Fever over 101.0 Inability to urinate Continued bleeding from incision. Increased pain, redness, or  drainage from the incision. Increasing abdominal pain  The clinic staff is available to answer your questions during regular business hours.  Please don't hesitate to call and ask to speak to one of the nurses for clinical concerns.  If you have a medical emergency, go to the nearest emergency room or call 911.  A surgeon from [facility] is always on call at the hospital. [address], [address], [place], [place]  [address] ? [address] [address], [place], [place]   [address] [phone] ? [phone] ? FAX [phone]   

## 2022-02-03 NOTE — Interval H&P Note (Signed)
History and Physical Interval Note:  02/03/2022 11:37 AM  Beth Thomas  has presented today for surgery, with the diagnosis of Symptomatic Cholelithiasis.  The various methods of treatment have been discussed with the patient and family. After consideration of risks, benefits and other options for treatment, the patient has consented to  Procedure(s): LAPAROSCOPIC CHOLECYSTECTOMY WITH INTRAOPERATIVE CHOLANGIOGRAM (N/A) as a surgical intervention.  The patient's history has been reviewed, patient examined, no change in status, stable for surgery.  I have reviewed the patient's chart and labs.  Questions were answered to the patient's satisfaction.    The procedure has been discussed with the patient. Operative and non operative treatments have been discussed. Risks of surgery include bleeding, infection,  Common bile duct injury,  Injury to the stomach,liver, colon,small intestine, abdominal wall,  Diaphragm,  Major blood vessels,  And the need for an open procedure.  Other risks include worsening of medical problems, death,  DVT and pulmonary embolism, and cardiovascular events.   Medical options have also been discussed. The patient has been informed of long term expectations of surgery and non surgical options,  The patient agrees to proceed.    Nassir Neidert A Layal Javid

## 2022-02-04 ENCOUNTER — Observation Stay (HOSPITAL_COMMUNITY): Payer: BC Managed Care – PPO | Admitting: Certified Registered Nurse Anesthetist

## 2022-02-04 ENCOUNTER — Encounter (HOSPITAL_COMMUNITY): Payer: Self-pay | Admitting: Surgery

## 2022-02-04 ENCOUNTER — Encounter (HOSPITAL_COMMUNITY)
Admission: EM | Disposition: A | Discharge status: Home / Self Care | Payer: Self-pay | Source: Home / Self Care | Attending: Emergency Medicine

## 2022-02-04 ENCOUNTER — Observation Stay (HOSPITAL_COMMUNITY): Payer: BC Managed Care – PPO

## 2022-02-04 DIAGNOSIS — K8050 Calculus of bile duct without cholangitis or cholecystitis without obstruction: Secondary | ICD-10-CM | POA: Diagnosis not present

## 2022-02-04 HISTORY — PX: REMOVAL OF STONES: SHX5545

## 2022-02-04 HISTORY — PX: SPHINCTEROTOMY: SHX5544

## 2022-02-04 HISTORY — PX: ENDOSCOPIC RETROGRADE CHOLANGIOPANCREATOGRAPHY (ERCP) WITH PROPOFOL: SHX5810

## 2022-02-04 LAB — LIPASE, BLOOD: Lipase: 31 U/L (ref 11–51)

## 2022-02-04 LAB — COMPREHENSIVE METABOLIC PANEL
ALT: 100 U/L — ABNORMAL HIGH (ref 0–44)
AST: 104 U/L — ABNORMAL HIGH (ref 15–41)
Albumin: 2.9 g/dL — ABNORMAL LOW (ref 3.5–5.0)
Alkaline Phosphatase: 81 U/L (ref 38–126)
Anion gap: 10 (ref 5–15)
BUN: <5 mg/dL — ABNORMAL LOW (ref 6–20)
CO2: 23 mmol/L (ref 22–32)
Calcium: 8.3 mg/dL — ABNORMAL LOW (ref 8.9–10.3)
Chloride: 102 mmol/L (ref 98–111)
Creatinine, Ser: 0.68 mg/dL (ref 0.44–1.00)
GFR, Estimated: >60 mL/min (ref >60)
Glucose, Bld: 91 mg/dL (ref 70–99)
Potassium: 3.6 mmol/L (ref 3.5–5.1)
Sodium: 135 mmol/L (ref 135–145)
Total Bilirubin: 0.6 mg/dL (ref 0.3–1.2)
Total Protein: 6.2 g/dL — ABNORMAL LOW (ref 6.5–8.1)

## 2022-02-04 SURGERY — ENDOSCOPIC RETROGRADE CHOLANGIOPANCREATOGRAPHY (ERCP) WITH PROPOFOL
Anesthesia: General

## 2022-02-04 MED ORDER — GLUCAGON HCL RDNA (DIAGNOSTIC) 1 MG IJ SOLR
INTRAMUSCULAR | Status: AC
  Filled 2022-02-04: qty 1

## 2022-02-04 MED ORDER — ONDANSETRON HCL 4 MG/2ML IJ SOLN
INTRAMUSCULAR | Status: DC | PRN
  Administered 2022-02-04: 4 mg via INTRAVENOUS

## 2022-02-04 MED ORDER — SUCCINYLCHOLINE CHLORIDE 200 MG/10ML IV SOSY
PREFILLED_SYRINGE | INTRAVENOUS | Status: DC | PRN
  Administered 2022-02-04: 100 mg via INTRAVENOUS

## 2022-02-04 MED ORDER — LIDOCAINE 2% (20 MG/ML) 5 ML SYRINGE
INTRAMUSCULAR | Status: DC | PRN
  Administered 2022-02-04: 40 mg via INTRAVENOUS

## 2022-02-04 MED ORDER — SUGAMMADEX SODIUM 200 MG/2ML IV SOLN
INTRAVENOUS | Status: DC | PRN
  Administered 2022-02-04: 200 mg via INTRAVENOUS

## 2022-02-04 MED ORDER — ENOXAPARIN SODIUM 40 MG/0.4ML IJ SOSY: 40.0000 mg | PREFILLED_SYRINGE | INTRAMUSCULAR | Status: DC

## 2022-02-04 MED ORDER — GLUCAGON HCL RDNA (DIAGNOSTIC) 1 MG IJ SOLR
INTRAMUSCULAR | Status: DC | PRN
  Administered 2022-02-04: .25 mg via INTRAVENOUS

## 2022-02-04 MED ORDER — PROPOFOL 10 MG/ML IV BOLUS
INTRAVENOUS | Status: DC | PRN
  Administered 2022-02-04: 130 mg via INTRAVENOUS

## 2022-02-04 MED ORDER — ACETAMINOPHEN 10 MG/ML IV SOLN
1000.0000 mg | Freq: Once | INTRAVENOUS | Status: DC
  Filled 2022-02-04: qty 100

## 2022-02-04 MED ORDER — DEXAMETHASONE SODIUM PHOSPHATE 10 MG/ML IJ SOLN
INTRAMUSCULAR | Status: DC | PRN
  Administered 2022-02-04: 10 mg via INTRAVENOUS

## 2022-02-04 MED ORDER — ROCURONIUM BROMIDE 10 MG/ML (PF) SYRINGE
PREFILLED_SYRINGE | INTRAVENOUS | Status: DC | PRN
  Administered 2022-02-04: 40 mg via INTRAVENOUS

## 2022-02-04 MED ORDER — DICLOFENAC SUPPOSITORY 100 MG
RECTAL | Status: AC
  Filled 2022-02-04: qty 1

## 2022-02-04 MED ORDER — FENTANYL CITRATE (PF) 250 MCG/5ML IJ SOLN
INTRAMUSCULAR | Status: DC | PRN
  Administered 2022-02-04 (×2): 25 ug via INTRAVENOUS
  Administered 2022-02-04: 50 ug via INTRAVENOUS

## 2022-02-04 MED ORDER — LACTATED RINGERS IV SOLN
INTRAVENOUS | Status: AC | PRN
  Administered 2022-02-04: 1000 mL via INTRAVENOUS

## 2022-02-04 MED ORDER — SODIUM CHLORIDE 0.9 % IV SOLN
INTRAVENOUS | Status: DC | PRN
  Administered 2022-02-04: 25 mL

## 2022-02-04 MED ORDER — INDOMETHACIN 50 MG RE SUPP
RECTAL | Status: AC
  Filled 2022-02-04: qty 2

## 2022-02-04 MED ORDER — INDOMETHACIN 50 MG RE SUPP
RECTAL | Status: DC | PRN
  Administered 2022-02-04: 50 mg via RECTAL

## 2022-02-04 MED ORDER — CIPROFLOXACIN IN D5W 400 MG/200ML IV SOLN
INTRAVENOUS | Status: AC
  Filled 2022-02-04: qty 200

## 2022-02-04 MED ORDER — CIPROFLOXACIN IN D5W 400 MG/200ML IV SOLN
INTRAVENOUS | Status: DC | PRN
  Administered 2022-02-04: 400 mg via INTRAVENOUS

## 2022-02-04 MED ORDER — FENTANYL CITRATE (PF) 100 MCG/2ML IJ SOLN
INTRAMUSCULAR | Status: AC
  Filled 2022-02-04: qty 2

## 2022-02-04 NOTE — Op Note (Signed)
[facility] Patient Name: Beth Thomas Procedure Date : 02/04/2022 MRN: [id number] Attending MD: Justice Britain , MD, [id number] Date of Birth: 1995-06-21 CSN: [id number] Age: 26 Admit Type: Inpatient Procedure:                ERCP Indications:              Bile duct stone(s), Abnormal intraoperative                            cholangiogram, Elevated liver enzymes Providers:                Justice Britain, MD, Jaci Carrel, RN Referring MD:             Inpatient medical service, inpatient surgical                            service Medicines:                General Anesthesia, Cipro 400 mg IV, Indomethacin                            100 mg PR, Glucagon 3.50 mg IV Complications:            No immediate complications. Estimated Blood Loss:     Estimated blood loss was minimal. Procedure:                Pre-Anesthesia Assessment:                           - Prior to the procedure, a History and Physical                            was performed, and patient medications and                            allergies were reviewed. The patient's tolerance of                            previous anesthesia was also reviewed. The risks                            and benefits of the procedure and the sedation                            options and risks were discussed with the patient.                            All questions were answered, and informed consent                            was obtained. Prior Anticoagulants: The patient has                            taken Lovenox (enoxaparin), last dose was 1 day  prior to procedure. ASA Grade Assessment: II - A                            patient with mild systemic disease. After reviewing                            the risks and benefits, the patient was deemed in                            satisfactory condition to undergo the procedure.                           After obtaining informed  consent, the scope was                            passed under direct vision. Throughout the                            procedure, the patient's blood pressure, pulse, and                            oxygen saturations were monitored continuously. The                            [employer] single use                            duodenoscope was introduced through the mouth, and                            used to inject contrast into and used to cannulate                            the bile duct. The ERCP was accomplished without                            difficulty. The patient tolerated the procedure. Scope In: Scope Out: Findings:      A scout film of the abdomen was obtained. Surgical clips, consistent       with a previous cholecystectomy, were seen in the area of the right       upper quadrant of the abdomen.      The esophagus was successfully intubated under direct vision without       detailed examination of the pharynx, larynx, and associated structures,       and upper GI tract. The major papilla was normal.      A short 0.035 inch Soft Jagwire was passed into the biliary tree. The       Hydratome sphincterotome was passed over the guidewire and the bile duct       was then deeply cannulated. Contrast was injected. I personally       interpreted the bile duct images. Ductal flow of contrast was adequate.       Image quality was adequate. Contrast extended to the hepatic ducts.  Opacification of the entire biliary tree except for the gallbladder was       successful. The lower third of the main bile duct contained filling       defect thought to be sludge. The middle third of the main bile duct and       upper third of the main bile duct were mildly dilated. The largest       diameter was 10 mm. An 8 mm biliary sphincterotomy was made with a       monofilament Hydratome sphincterotome using ERBE electrocautery. There       was no post-sphincterotomy  bleeding. To discover objects, the biliary       tree was swept with a retrieval balloon. Sludge was swept from the duct.       Two stones were removed. No stones remained. An occlusion cholangiogram       was performed that showed no further significant biliary pathology.      A pancreatogram was not performed.      The duodenoscope was withdrawn from the patient. Impression:               - The major papilla appeared normal.                           - The fluoroscopic examination was suspicious for                            sludge.                           - The upper third of the main bile duct and middle                            third of the main bile duct were mildly dilated.                           - Choledocholithiasis was found. Complete removal                            was accomplished by biliary sphincterotomy and                            balloon trawl. Recommendation:           - The patient will be observed post-procedure,                            until all discharge criteria are met.                           - Return patient to hospital ward for ongoing care.                           - Advance diet as tolerated.                           - Observe patient's clinical course.                           -  Watch for pancreatitis, bleeding, perforation,                            and cholangitis.                           - Check liver enzymes (AST, ALT, alkaline                            phosphatase, bilirubin) in the morning.                           - May restart VTE prophylaxis in 24 hours to                            decrease risk of post interventional bleeding.                           - The findings and recommendations were discussed                            with the patient.                           - The findings and recommendations were discussed                            with the referring physician. Procedure Code(s):        --- Professional  ---                           [phone], Endoscopic retrograde                            cholangiopancreatography (ERCP); with removal of                            calculi/debris from biliary/pancreatic duct(s)                           43262, Endoscopic retrograde                            cholangiopancreatography (ERCP); with                            sphincterotomy/papillotomy Diagnosis Code(s):        --- Professional ---                           K80.50, Calculus of bile duct without cholangitis                            or cholecystitis without obstruction                           R74.8, Abnormal levels of other serum enzymes  K83.8, Other specified diseases of biliary tract                           R93.2, Abnormal findings on diagnostic imaging of                            liver and biliary tract CPT copyright 2022 American Medical Association. All rights reserved. The codes documented in this report are preliminary and upon coder review may  be revised to meet current compliance requirements. Justice Britain, MD 02/04/2022 2:29:29 PM Number of Addenda: 0

## 2022-02-04 NOTE — Progress Notes (Signed)
1 Day Post-Op  Subjective: CC: ERCP scheduled for today.  Feels much better than before surgery. Just sore around incisions now. Feels prn oxy is controlling her pain. Tolerated cld without n/v yesterday. NPO for ERCP now. Passing flatus. No BM. Voiding. Mobilizing well.   Objective: Vital signs in last 24 hours: Temp:  [97.9 F (36.6 C)-98.7 F (37.1 C)] 98.2 F (36.8 C) (11/22 0401) Pulse Rate:  [59-72] 63 (11/22 0401) Resp:  [15-22] 18 (11/22 0401) BP: (102-138)/(52-83) 114/71 (11/22 0401) SpO2:  [93 %-99 %] 98 % (11/22 0401)    Intake/Output from previous day: 11/21 0701 - 11/22 0700 In: 1595.9 [I.V.:1495.9; IV Piggyback:100] Out: 15 [Blood:15] Intake/Output this shift: No intake/output data recorded.  PE: Gen:  Alert, NAD, pleasant Abd: Soft, no distension, appropriately tender around laparoscopic incisions, no rigidity or guarding and otherwise NT, +BS. Incisions with glue intact appears well and are without drainage, bleeding, or signs of infection   Lab Results:  Recent Labs    02/02/22 1247 02/03/22 0549  WBC 11.8* 5.7  HGB 12.7 11.2*  HCT 39.3 34.3*  PLT 275 209   BMET Recent Labs    02/02/22 1247 02/03/22 0549  NA 136 139  K 3.6 3.7  CL 100 104  CO2 22 24  GLUCOSE 118* 96  BUN 9 6  CREATININE 0.74 0.78  CALCIUM 9.1 8.4*   PT/INR No results for input(s): "LABPROT", "INR" in the last 72 hours. CMP     Component Value Date/Time   NA 139 02/03/2022 0549   K 3.7 02/03/2022 0549   CL 104 02/03/2022 0549   CO2 24 02/03/2022 0549   GLUCOSE 96 02/03/2022 0549   BUN 6 02/03/2022 0549   CREATININE 0.78 02/03/2022 0549   CREATININE 0.71 11/04/2021 0922   CALCIUM 8.4 (L) 02/03/2022 0549   PROT 6.0 (L) 02/03/2022 0549   ALBUMIN 2.9 (L) 02/03/2022 0549   AST 54 (H) 02/03/2022 0549   ALT 64 (H) 02/03/2022 0549   ALKPHOS 59 02/03/2022 0549   BILITOT 0.4 02/03/2022 0549   GFRNONAA >60 02/03/2022 0549   GFRNONAA 122 07/19/2019 1543   GFRAA 142  07/19/2019 1543   Lipase     Component Value Date/Time   LIPASE 29 02/02/2022 1247    Studies/Results: DG Cholangiogram Operative  Result Date: 02/03/2022 CLINICAL DATA:  26 year old female history of cholelithiasis undergoing cholecystectomy. EXAM: INTRAOPERATIVE CHOLANGIOGRAM TECHNIQUE: Cholangiographic images from the C-arm fluoroscopic device were submitted for interpretation post-operatively. Please see the procedural report for the amount of contrast and the fluoroscopy time utilized. COMPARISON:  Abdominal ultrasound from 02/01/2022 FINDINGS: Intraoperative antegrade injection via the cystic duct which opacifies the common bile duct and central portions of the intrahepatic biliary tree. Contrast is not visualized flowing freely into the duodenum. There are no filling defects. Moderate extrahepatic biliary ductal dilation. No apparent anomalous anatomical configuration of the biliary tree. IMPRESSION: Moderate extrahepatic biliary ductal dilation without evidence of passage through the ampulla into the duodenum. Concern for distal common bile duct obstruction secondary to choledocholithiasis. Consider ERCP for further characterization. Marliss Coots, MD [facility] Electronically Signed   By: Marliss Coots M.D.   On: 02/03/2022 13:59    Anti-infectives: Anti-infectives (From admission, onward)    Start     Dose/Rate Route Frequency Ordered Stop   02/02/22 1700  cefTRIAXone (ROCEPHIN) 2 g in sodium chloride 0.9 % 100 mL IVPB        2 g  200 mL/hr over 30 Minutes Intravenous Every 24 hours 02/02/22 1626 02/09/22 1659        Assessment/Plan POD 1 s/p Laparoscopic Cholecystectomy with IOC by Dr. Luisa Hart on 11/21 - IOC +. Repeat LFT's pending this am. GI planning ERCP today - Mobilize, pulm toilet  FEN - NPO for ERCP - okay to ADAT after, IVF per [facility] VTE - SCDs, Lovenox ID - Rocephin (okay to d/c from our standpoint)     LOS: 0 days    Jacinto Halim , [facility] 02/04/2022, 7:56 AM Please see Amion for pager number during day hours 7:00am-4:30pm

## 2022-02-04 NOTE — Transfer of Care (Signed)
Immediate Anesthesia Transfer of Care Note  Patient: Jody Aguinaga  Procedure(s) Performed: ENDOSCOPIC RETROGRADE CHOLANGIOPANCREATOGRAPHY (ERCP) WITH PROPOFOL  Patient Location: PACU  Anesthesia Type:General  Level of Consciousness: awake and alert   Airway & Oxygen Therapy: Patient Spontanous Breathing  Post-op Assessment: Report given to RN and Post -op Vital signs reviewed and stable  Post vital signs: Reviewed and stable  Last Vitals:  Vitals Value Taken Time  BP 116/63 02/04/22 1433  Temp 36.3 C 02/04/22 1433  Pulse 81 02/04/22 1437  Resp 20 02/04/22 1437  SpO2 91 % 02/04/22 1437  Vitals shown include unvalidated device data.  Last Pain:  Vitals:   02/04/22 1433  TempSrc: Temporal  PainSc: 0-No pain      Patients Stated Pain Goal: 0 (02/03/22 1730)  Complications: No notable events documented.

## 2022-02-04 NOTE — Interval H&P Note (Signed)
History and Physical Interval Note:  02/04/2022 1:20 PM  Beth Thomas  has presented today for surgery, with the diagnosis of obstructed CBD at [facility].  The various methods of treatment have been discussed with the patient and family. After consideration of risks, benefits and other options for treatment, the patient has consented to  Procedure(s): ENDOSCOPIC RETROGRADE CHOLANGIOPANCREATOGRAPHY (ERCP) WITH PROPOFOL (N/A) as a surgical intervention.  The patient's history has been reviewed, patient examined, no change in status, stable for surgery.  I have reviewed the patient's chart and labs.  Questions were answered to the patient's satisfaction.     The risks of an ERCP were discussed at length, including but not limited to the risk of perforation, bleeding, abdominal pain, post-ERCP pancreatitis (while usually mild can be severe and even life threatening).    [employer]

## 2022-02-04 NOTE — Anesthesia Procedure Notes (Signed)
Procedure Name: Intubation Date/Time: 02/04/2022 1:37 PM  Performed by: Inda Coke, CRNAPre-anesthesia Checklist: Patient identified, Emergency Drugs available, Suction available, Timeout performed and Patient being monitored Patient Re-evaluated:Patient Re-evaluated prior to induction Oxygen Delivery Method: Circle system utilized Preoxygenation: Pre-oxygenation with 100% oxygen Induction Type: IV induction Ventilation: Mask ventilation without difficulty Laryngoscope Size: Mac and 3 Grade View: Grade I Tube type: Oral Airway Equipment and Method: Stylet and Bite block Placement Confirmation: ETT inserted through vocal cords under direct vision, positive ETCO2, CO2 detector and breath sounds checked- equal and bilateral Secured at: 21 cm Tube secured with: Tape Dental Injury: Teeth and Oropharynx as per pre-operative assessment

## 2022-02-04 NOTE — Progress Notes (Signed)
Daily Rounding Note  02/04/2022, 10:59 AM  LOS: 0 days   SUBJECTIVE:   Chief complaint:    Lap chole 01/2020.  Obstruction at distal CBD on IOC.  Patient continues having pain in her right upper quadrant.  No nausea.  OBJECTIVE:         Vital signs in last 24 hours:    Temp:  [97.9 F (36.6 C)-98.7 F (37.1 C)] 98.2 F (36.8 C) (11/22 0401) Pulse Rate:  [60-72] 63 (11/22 0401) Resp:  [16-22] 18 (11/22 0401) BP: (106-138)/(52-83) 114/71 (11/22 0401) SpO2:  [93 %-99 %] 98 % (11/22 0401)   Filed Weights   02/02/22 1214  Weight: 79.8 kg   General: Looks uncomfortable.  Not jaundiced Heart: RRR Chest: Clear bilaterally no labored breathing Abdomen: Tender on right abdomen in the region of surgical scars.  Sounds somewhat hypoactive Extremities: No CCE Neuro/Psych: Oriented x 3.  Somewhat disengaged but listened to all the conversation between myself, her father and the patient.  Intake/Output from previous day: 11/21 0701 - 11/22 0700 In: 1595.9 [I.V.:1495.9; IV Piggyback:100] Out: 15 [Blood:15]  Intake/Output this shift: No intake/output data recorded.  Lab Results: Recent Labs    02/02/22 1247 02/03/22 0549  WBC 11.8* 5.7  HGB 12.7 11.2*  HCT 39.3 34.3*  PLT 275 209   BMET Recent Labs    02/02/22 1247 02/03/22 0549 02/04/22 0714  NA 136 139 135  K 3.6 3.7 3.6  CL 100 104 102  CO2 _0 GLUCOSE 118* 96 91  BUN 9 6 <5*  CREATININE 0.74 0.78 0.68  CALCIUM 9.1 8.4* 8.3*   LFT Recent Labs    02/02/22 1247 02/03/22 0549 02/04/22 0714  PROT 7.4 6.0* 6.2*  ALBUMIN 3.7 2.9* 2.9*  AST 88* 54* 104*  ALT 80* 64* 100*  ALKPHOS 71 59 81  BILITOT 1.3* 0.4 0.6   PT/INR No results for input(s): "LABPROT", "INR" in the last 72 hours. Hepatitis Panel No results for input(s): "HEPBSAG", "HCVAB", "HEPAIGM", "HEPBIGM" in the last 72 hours.  Studies/Results: DG Cholangiogram  Operative  Result Date: 02/03/2022 CLINICAL DATA:  26 year old female history of cholelithiasis undergoing cholecystectomy. EXAM: INTRAOPERATIVE CHOLANGIOGRAM TECHNIQUE: Cholangiographic images from the C-arm fluoroscopic device were submitted for interpretation post-operatively. Please see the procedural report for the amount of contrast and the fluoroscopy time utilized. COMPARISON:  Abdominal ultrasound from 02/01/2022 FINDINGS: Intraoperative antegrade injection via the cystic duct which opacifies the common bile duct and central portions of the intrahepatic biliary tree. Contrast is not visualized flowing freely into the duodenum. There are no filling defects. Moderate extrahepatic biliary ductal dilation. No apparent anomalous anatomical configuration of the biliary tree. IMPRESSION: Moderate extrahepatic biliary ductal dilation without evidence of passage through the ampulla into the duodenum. Concern for distal common bile duct obstruction secondary to choledocholithiasis. Consider ERCP for further characterization. Ruthann Cancer, MD [facility] Electronically Signed   By: Ruthann Cancer M.D.   On: 02/03/2022 13:59    ASSESMENT:   Lap chole with IOC 01/2021.  Obstruction at distal CBD on IOC yesterday.  Elevated LFTs starting 3 days ago are normalizing but not yet normal.  These include elevated AST/ALT, now resolved elevation T. bili.  Alk phos never elevated.   PLAN   ERCP set for about 1215 today with Dr. Rush Landmark.  Discussed risks of bleeding, pancreatitis, inability to perform intended study due to anatomy, possible need for  biliary stent placement, infection (got Rocephin at 6 AM today).  She is willing to proceed.  Note that she received Lovenox, last dose was 1830 yesterday evening.  I change the Lovenox dosing so the next dose will be tomorrow evening.    Azucena Freed  02/04/2022, 10:59 AM Phone [phone]

## 2022-02-04 NOTE — Anesthesia Preprocedure Evaluation (Signed)
Anesthesia Evaluation  Patient identified by MRN, date of birth, ID band Patient awake    Reviewed: Allergy & Precautions, H&P , NPO status , Patient's Chart, lab work & pertinent test results  Airway Mallampati: II   Neck ROM: full    Dental   Pulmonary neg pulmonary ROS   breath sounds clear to auscultation       Cardiovascular negative cardio ROS  Rhythm:regular Rate:Normal     Neuro/Psych  PSYCHIATRIC DISORDERS Anxiety        GI/Hepatic S/p cholecystectomy yesterday   Endo/Other    Renal/GU      Musculoskeletal   Abdominal   Peds  Hematology   Anesthesia Other Findings   Reproductive/Obstetrics                             Anesthesia Physical Anesthesia Plan  ASA: 2  Anesthesia Plan: General   Post-op Pain Management:    Induction: Intravenous  PONV Risk Score and Plan: 3 and Ondansetron, Dexamethasone, Midazolam and Treatment may vary due to age or medical condition  Airway Management Planned: Oral ETT  Additional Equipment:   Intra-op Plan:   Post-operative Plan: Extubation in OR  Informed Consent: I have reviewed the patients History and Physical, chart, labs and discussed the procedure including the risks, benefits and alternatives for the proposed anesthesia with the patient or authorized representative who has indicated his/her understanding and acceptance.     Dental advisory given  Plan Discussed with: CRNA, Anesthesiologist and Surgeon  Anesthesia Plan Comments:        Anesthesia Quick Evaluation

## 2022-02-05 DIAGNOSIS — R7989 Other specified abnormal findings of blood chemistry: Secondary | ICD-10-CM | POA: Diagnosis not present

## 2022-02-05 DIAGNOSIS — K8050 Calculus of bile duct without cholangitis or cholecystitis without obstruction: Secondary | ICD-10-CM | POA: Diagnosis not present

## 2022-02-05 LAB — COMPREHENSIVE METABOLIC PANEL
ALT: 121 U/L — ABNORMAL HIGH (ref 0–44)
AST: 103 U/L — ABNORMAL HIGH (ref 15–41)
Albumin: 3.2 g/dL — ABNORMAL LOW (ref 3.5–5.0)
Alkaline Phosphatase: 96 U/L (ref 38–126)
Anion gap: 13 (ref 5–15)
BUN: <5 mg/dL — ABNORMAL LOW (ref 6–20)
CO2: 25 mmol/L (ref 22–32)
Calcium: 9.2 mg/dL (ref 8.9–10.3)
Chloride: 98 mmol/L (ref 98–111)
Creatinine, Ser: 0.83 mg/dL (ref 0.44–1.00)
GFR, Estimated: >60 mL/min (ref >60)
Glucose, Bld: 112 mg/dL — ABNORMAL HIGH (ref 70–99)
Potassium: 3.4 mmol/L — ABNORMAL LOW (ref 3.5–5.1)
Sodium: 136 mmol/L (ref 135–145)
Total Bilirubin: 0.8 mg/dL (ref 0.3–1.2)
Total Protein: 6.8 g/dL (ref 6.5–8.1)

## 2022-02-05 MED ORDER — OXYCODONE HCL 5 MG PO TABS: 5.0000 mg | ORAL_TABLET | Freq: Four times a day (QID) | ORAL | 0 refills | Status: DC | PRN

## 2022-02-05 MED ORDER — ACETAMINOPHEN 325 MG PO TABS: 650.0000 mg | ORAL_TABLET | Freq: Four times a day (QID) | ORAL | Status: DC | PRN

## 2022-02-05 NOTE — Discharge Summary (Signed)
[facility] Discharge Summary   Patient ID: Beth Thomas MRN: [id number] DOB/AGE: 1995/07/22 26 y.o.  Admit date: 02/02/2022 Discharge date: 02/05/2022  Admitting Diagnosis: Biliary colic  Discharge Diagnosis Choledocholithiasis    Consultants GI   Imaging: DG ERCP  Result Date: 02/04/2022 CLINICAL DATA:  Choledocholithiasis EXAM: ERCP COMPARISON:  IOC 02/03/2022. RIGHT upper quadrant ultrasound, 02/01/2022. FLUOROSCOPY: Exposure Index (as provided by the fluoroscopic device): 27.8 mGy Kerma FINDINGS: Multiple, limited oblique planar images of the RIGHT upper quadrant obtained C-arm. Images demonstrating flexible endoscopy, biliary duct cannulation, sphincterotomy, retrograde cholangiogram and balloon sweep. No biliary ductal dilation. No evidence of biliary filling defect is demonstrated. Successful balloon sweep with contrast reaching the duodenum. Cholecystectomy clips are present. IMPRESSION: Fluoroscopic imaging for ERCP. For complete description of intra procedural findings, please see performing service dictation. Electronically Signed   By: Roanna Banning M.D.   On: 02/04/2022 15:17   DG Cholangiogram Operative  Result Date: 02/03/2022 CLINICAL DATA:  26 year old female history of cholelithiasis undergoing cholecystectomy. EXAM: INTRAOPERATIVE CHOLANGIOGRAM TECHNIQUE: Cholangiographic images from the C-arm fluoroscopic device were submitted for interpretation post-operatively. Please see the procedural report for the amount of contrast and the fluoroscopy time utilized. COMPARISON:  Abdominal ultrasound from 02/01/2022 FINDINGS: Intraoperative antegrade injection via the cystic duct which opacifies the common bile duct and central portions of the intrahepatic biliary tree. Contrast is not visualized flowing freely into the duodenum. There are no filling defects. Moderate extrahepatic biliary ductal dilation. No apparent anomalous anatomical configuration of the  biliary tree. IMPRESSION: Moderate extrahepatic biliary ductal dilation without evidence of passage through the ampulla into the duodenum. Concern for distal common bile duct obstruction secondary to choledocholithiasis. Consider ERCP for further characterization. Marliss Coots, MD [facility] Electronically Signed   By: Marliss Coots M.D.   On: 02/03/2022 13:59    Procedures Dr. Maisie Fus Cornett (02/03/22) - Laparoscopic Cholecystectomy with IOC Dr. Vicente Serene Mansouraty (02/04/22) - ERCP   Hospital Course:  Patient is a 26 year old female who presented to the ED with abdominal pain.  Workup showed symptomatic cholelithiasis .  Patient was admitted and underwent procedure listed above. Found to have choledocholithiasis and GI consulted for ERCP. Tolerated procedure well and was transferred to the floor.  Diet was advanced as tolerated.  On POD2, the patient was voiding well, tolerating diet, ambulating well, pain well controlled, vital signs stable, incisions c/d/i and felt stable for discharge home.  Patient will follow up in our office in 3-4 weeks and knows to call with questions or concerns. She will call to confirm appointment date/time.    Physical Exam: General:  Alert, NAD, pleasant, comfortable Abd:  Soft, ND, mild tenderness, incisions C/D/I  I or a member of my team have reviewed this patient in the Controlled Substance Database.   Allergies as of 02/05/2022       Reactions   Lactose Intolerance (gi) Other (See Comments)   unk        Medication List     STOP taking these medications    oxyCODONE-acetaminophen 5-325 MG tablet Commonly known as: PERCOCET/ROXICET       TAKE these medications    acetaminophen 325 MG tablet Commonly known as: TYLENOL Take 2 tablets (650 mg total) by mouth every 6 (six) hours as needed for mild pain (or temp > 100).   B-12 PO Take 1 capsule by mouth daily.   Cetirizine HCl 10 MG  Caps Take 10 mg by mouth daily.  citalopram 20 MG tablet Commonly known as: CELEXA TAKE 1 TABLET BY MOUTH DAILY FOR MOOD What changed: See the new instructions.   drospirenone-ethinyl estradiol 3-0.03 MG tablet Commonly known as: Syeda Take 1 tablet Daily   methocarbamol 750 MG tablet Commonly known as: ROBAXIN Take 1 tablet (750 mg total) by mouth every 8 (eight) hours as needed for muscle spasms.   ondansetron 4 MG tablet Commonly known as: ZOFRAN Take 1 tablet (4 mg total) by mouth every 6 (six) hours.   oxyCODONE 5 MG immediate release tablet Commonly known as: Oxy IR/ROXICODONE Take 1 tablet (5 mg total) by mouth every 6 (six) hours as needed for moderate pain or severe pain.   penicillin v potassium 500 MG tablet Commonly known as: VEETID Take 1 tablet (500 mg total) by mouth 3 (three) times daily.   phentermine 37.5 MG tablet Commonly known as: ADIPEX-P Take 1/2 to 1 tablet every morning for dieting & weightloss   rosuvastatin 10 MG tablet Commonly known as: Crestor Take 1 tablet (10 mg total) by mouth daily. What changed: how much to take   Saxenda 18 MG/3ML Sopn Generic drug: Liraglutide -Weight Management Inject 3 mg into the skin daily. Inject one pen SQ daily for weight loss   Vitamin D (Cholecalciferol) 25 MCG (1000 UT) Tabs Take 25 mcg by mouth daily.          Follow-up Information     [facility], [place]. Go on 02/24/2022.   Specialty: General Surgery Why: Your appointment is 02/24/22 at 9:15 am Arrive early to check in, fill out paperwork, Bring photo ID and insurance information Contact information: [address] [place] [address] [phone]                Signed: Juliet Rude , [facility] 02/05/2022, 9:20 AM Please see Amion for pager number during day hours 7:00am-4:30pm

## 2022-02-05 NOTE — Anesthesia Postprocedure Evaluation (Signed)
Anesthesia Post Note  Patient: Beth Thomas  Procedure(s) Performed: ENDOSCOPIC RETROGRADE CHOLANGIOPANCREATOGRAPHY (ERCP) WITH PROPOFOL SPHINCTEROTOMY REMOVAL OF STONES     Patient location during evaluation: PACU Anesthesia Type: General Level of consciousness: awake and alert Pain management: pain level controlled Vital Signs Assessment: post-procedure vital signs reviewed and stable Respiratory status: spontaneous breathing, nonlabored ventilation, respiratory function stable and patient connected to nasal cannula oxygen Cardiovascular status: blood pressure returned to baseline and stable Postop Assessment: no apparent nausea or vomiting Anesthetic complications: no   No notable events documented.  Last Vitals:  Vitals:   02/05/22 0603 02/05/22 0736  BP: 106/61 113/64  Pulse: 78 71  Resp: 16 18  Temp: 36.8 C 36.8 C  SpO2: 98% 98%    Last Pain:  Vitals:   02/05/22 0736  TempSrc: Oral  PainSc:                  [name] S

## 2022-02-05 NOTE — Progress Notes (Signed)
[facility] Progress Note  Chief Complaint: Choledocholithiasis  History:  Feeling well overnight since ERCP with stone extraction yesterday. Tolerating full liquids last evening, hungry for solid foods today.  Denies abdominal pain or chest pain or dyspnea.   Objective:   Current Facility-Administered Medications:    0.9 %  sodium chloride infusion, , Intravenous, Continuous, Meuth, Brooke A, PA-C, Last Rate: 75 mL/hr at 02/04/22 0615, Infusion Verify at 02/04/22 0615   acetaminophen (OFIRMEV) IV 1,000 mg, 1,000 mg, Intravenous, Once, Epifanio Lesches, CRNA   acetaminophen (TYLENOL) tablet 650 mg, 650 mg, Oral, Q6H PRN **OR** acetaminophen (TYLENOL) suppository 650 mg, 650 mg, Rectal, Q6H PRN, Meuth, Brooke A, PA-C   cefTRIAXone (ROCEPHIN) 2 g in sodium chloride 0.9 % 100 mL IVPB, 2 g, Intravenous, Q24H, Meuth, Brooke A, PA-C, Last Rate: 200 mL/hr at 02/04/22 1754, 2 g at 02/04/22 1754   citalopram (CELEXA) tablet 20 mg, 20 mg, Oral, Daily, Meuth, Brooke A, PA-C, 20 mg at 02/03/22 9604   diphenhydrAMINE (BENADRYL) capsule 25 mg, 25 mg, Oral, Q6H PRN **OR** diphenhydrAMINE (BENADRYL) injection 25 mg, 25 mg, Intravenous, Q6H PRN, Meuth, Brooke A, PA-C   enoxaparin (LOVENOX) injection 40 mg, 40 mg, Subcutaneous, Q24H, Mansouraty, Netty Starring., MD   hydrALAZINE (APRESOLINE) injection 10 mg, 10 mg, Intravenous, Q2H PRN, Meuth, Brooke A, PA-C   methocarbamol (ROBAXIN) tablet 500 mg, 500 mg, Oral, Q8H PRN, 500 mg at 02/03/22 1825 **OR** methocarbamol (ROBAXIN) 500 mg in dextrose 5 % 50 mL IVPB, 500 mg, Intravenous, Q8H PRN, Meuth, Brooke A, PA-C   morphine (PF) 2 MG/ML injection 2-4 mg, 2-4 mg, Intravenous, Q3H PRN, Meuth, Brooke A, PA-C, 4 mg at 02/04/22 1059   ondansetron (ZOFRAN-ODT) disintegrating tablet 4 mg, 4 mg, Oral, Q6H PRN **OR** ondansetron (ZOFRAN) injection 4 mg, 4 mg, Intravenous, Q6H PRN, Meuth, Brooke A, PA-C, 4 mg at 02/03/22 5409   oxyCODONE (Oxy IR/ROXICODONE) immediate release  tablet 5-10 mg, 5-10 mg, Oral, Q4H PRN, Meuth, Brooke A, PA-C, 10 mg at 02/04/22 0408   pantoprazole (PROTONIX) injection 40 mg, 40 mg, Intravenous, QHS, Meuth, Brooke A, PA-C, 40 mg at 02/04/22 2137   polyethylene glycol (MIRALAX / GLYCOLAX) packet 17 g, 17 g, Oral, Daily PRN, Meuth, Brooke A, PA-C   prochlorperazine (COMPAZINE) tablet 10 mg, 10 mg, Oral, Q6H PRN **OR** prochlorperazine (COMPAZINE) injection 5-10 mg, 5-10 mg, Intravenous, Q6H PRN, Meuth, Brooke A, PA-C   simethicone (MYLICON) chewable tablet 40 mg, 40 mg, Oral, Q6H PRN, Meuth, Brooke A, PA-C   sodium chloride 75 mL/hr at 02/04/22 0615   acetaminophen     cefTRIAXone (ROCEPHIN)  IV 2 g (02/04/22 1754)   methocarbamol (ROBAXIN) IV       Vital signs in last 24 hrs: Vitals:   02/05/22 0603 02/05/22 0736  BP: 106/61 113/64  Pulse: 78 71  Resp: 16 18  Temp: 98.3 F (36.8 C) 98.2 F (36.8 C)  SpO2: 98% 98%    Intake/Output Summary (Last 24 hours) at 02/05/2022 0842 Last data filed at 02/04/2022 1420 Gross per 24 hour  Intake 500 ml  Output 0 ml  Net 500 ml     Physical Exam  HEENT: sclera anicteric,  Cardiac: RRR without murmurs, S1S2 heard, no peripheral edema Pulm: clear to auscultation bilaterally, normal RR and effort noted Abdomen: soft, no tenderness, with active bowel sounds. No guarding or palpable hepatosplenomegaly.  Surgical incisions clean dry and intact Skin; warm and dry, no jaundice  Recent Labs:  Latest Ref Rng & Units 02/03/2022    5:49 AM 02/02/2022   12:47 PM 02/01/2022    3:37 AM  CBC  WBC 4.0 - 10.5 K/uL 5.7  11.8  15.6   Hemoglobin 12.0 - 15.0 g/dL 30.0  76.2  26.3   Hematocrit 36.0 - 46.0 % 34.3  39.3  39.6   Platelets 150 - 400 K/uL 209  275  272     No results for input(s): "INR" in the last 168 hours.    Latest Ref Rng & Units 02/05/2022    2:53 AM 02/04/2022    7:14 AM 02/03/2022    5:49 AM  CMP  Glucose 70 - 99 mg/dL 335  91  96   BUN 6 - 20 mg/dL <5  <5  6    Creatinine 0.44 - 1.00 mg/dL 4.56  2.56  3.89   Sodium 135 - 145 mmol/L 136  135  139   Potassium 3.5 - 5.1 mmol/L 3.4  3.6  3.7   Chloride 98 - 111 mmol/L 98  102  104   CO2 22 - 32 mmol/L 25  23  24    Calcium 8.9 - 10.3 mg/dL 9.2  8.3  8.4   Total Protein 6.5 - 8.1 g/dL 6.8  6.2  6.0   Total Bilirubin 0.3 - 1.2 mg/dL 0.8  0.6  0.4   Alkaline Phos 38 - 126 U/L 96  81  59   AST 15 - 41 U/L 103  104  54   ALT 0 - 44 U/L 121  100  64      Radiologic studies:   Assessment & Plan  Assessment:  Epigastric/right upper quadrant pain Choledocholithiasis, biliary stone extraction during ERCP yesterday. Elevated LFTs from choledocholithiasis.  Not surprising they are about the same today after having undergone a biliary procedure yesterday.  They will slowly normalize.   Plan: Regular diet today.  I recommended she have low-fat meals to decrease the chance of bile acid diarrhea after cholecystectomy and ERCP.  Discharge today.  She does not need regular follow-up with our office.   III Office: [phone]

## 2022-02-05 NOTE — TOC Transition Note (Signed)
Transition of Care [facility]) - CM/SW Discharge Note   Patient Details  Name: Beth Thomas MRN: [id number] Date of Birth: [birth year]  [facility] CM/SW Contact:  Tom-Johnson, Hershal Coria, RN Phone Number: 02/05/2022, 10:25 AM   Clinical Narrative:     Patient is scheduled for discharge today. No TOC needs or recommendations noted. Denies any needs. Family to transport at discharge. No further TOC needs noted.     Final next level of care: Home/Self Care Barriers to Discharge: Barriers Resolved   Patient Goals and CMS Choice Patient states their goals for this hospitalization and ongoing recovery are:: To return home CMS Medicare.gov Compare Post Acute Care list provided to:: Patient Choice offered to / list presented to : NA  Discharge Placement                Patient to be transferred to facility by: Family      Discharge Plan and Services                DME Arranged: N/A DME Agency: NA       HH Arranged: NA HH Agency: NA        Social Determinants of Health (SDOH) Interventions Transportation Interventions: Inpatient TOC, Patient Resources (Friends/Family), Intervention Not Indicated   Readmission Risk Interventions     No data to display

## 2022-02-07 LAB — SURGICAL PATHOLOGY

## 2022-02-08 ENCOUNTER — Encounter (HOSPITAL_COMMUNITY): Payer: Self-pay | Admitting: Gastroenterology

## 2022-03-05 ENCOUNTER — Other Ambulatory Visit: Payer: Self-pay

## 2022-03-05 DIAGNOSIS — F419 Anxiety disorder, unspecified: Secondary | ICD-10-CM

## 2022-03-05 MED ORDER — CITALOPRAM HYDROBROMIDE 20 MG PO TABS: 20.0000 mg | ORAL_TABLET | Freq: Every day | ORAL | 0 refills | Status: DC

## 2022-03-26 ENCOUNTER — Telehealth: Payer: Self-pay

## 2022-03-26 ENCOUNTER — Other Ambulatory Visit: Payer: Self-pay | Admitting: Nurse Practitioner

## 2022-03-26 ENCOUNTER — Telehealth: Payer: Self-pay | Admitting: Nurse Practitioner

## 2022-03-26 DIAGNOSIS — E6609 Other obesity due to excess calories: Secondary | ICD-10-CM

## 2022-03-26 MED ORDER — PHENTERMINE HCL 37.5 MG PO TABS: ORAL_TABLET | ORAL | 2 refills | Status: DC

## 2022-03-26 NOTE — Telephone Encounter (Signed)
Pharmacy has Phentermine 37.5 mg, wanting to know if I can be sent to [place]

## 2022-03-26 NOTE — Telephone Encounter (Signed)
Phentermine prior auth completed and submitted.

## 2022-03-27 NOTE — Telephone Encounter (Signed)
Prior auth denied. 

## 2022-04-27 ENCOUNTER — Ambulatory Visit (INDEPENDENT_AMBULATORY_CARE_PROVIDER_SITE_OTHER): Payer: BC Managed Care – PPO | Admitting: Nurse Practitioner

## 2022-04-27 ENCOUNTER — Other Ambulatory Visit: Payer: Self-pay

## 2022-04-27 ENCOUNTER — Encounter: Payer: Self-pay | Admitting: Nurse Practitioner

## 2022-04-27 VITALS — BP 110/66 | HR 112 | Temp 97.0°F | Ht 63.0 in | Wt 181.6 lb

## 2022-04-27 DIAGNOSIS — J020 Streptococcal pharyngitis: Secondary | ICD-10-CM

## 2022-04-27 DIAGNOSIS — G43009 Migraine without aura, not intractable, without status migrainosus: Secondary | ICD-10-CM | POA: Diagnosis not present

## 2022-04-27 DIAGNOSIS — R6889 Other general symptoms and signs: Secondary | ICD-10-CM | POA: Diagnosis not present

## 2022-04-27 DIAGNOSIS — Z1152 Encounter for screening for COVID-19: Secondary | ICD-10-CM | POA: Diagnosis not present

## 2022-04-27 LAB — POCT RAPID STREP A (OFFICE): Rapid Strep A Screen: POSITIVE — AB

## 2022-04-27 LAB — POCT INFLUENZA A/B
Influenza A, POC: NEGATIVE
Influenza B, POC: NEGATIVE

## 2022-04-27 LAB — POC COVID19 BINAXNOW: SARS Coronavirus 2 Ag: NEGATIVE

## 2022-04-27 MED ORDER — PENICILLIN V POTASSIUM 500 MG PO TABS: 500.0000 mg | ORAL_TABLET | Freq: Three times a day (TID) | ORAL | 0 refills | Status: DC

## 2022-04-27 MED ORDER — PREDNISONE 20 MG PO TABS: ORAL_TABLET | ORAL | 0 refills | Status: DC

## 2022-04-27 NOTE — Progress Notes (Signed)
Assessment and Plan:  Audriauna Eggenberger was seen today for an episodic visit.  Diagnoses and all order for this visit:  Flu-like symptoms Negative  - POCT Influenza A/B  Encounter for screening for COVID-19 Negative  - POC COVID-19  Migraine without aura and without status migrainosus, not intractable Stay well hydrated Rest, quiet dark room  - predniSONE (DELTASONE) 20 MG tablet; 2 tablets daily for 3 days, 1 tablet daily for 4 days.  Dispense: 10 tablet; Refill: 0  Strep pharyngitis Start tmt with abx - take as directed in full Warm salt water gargles several times throughout the day Throat lozenges PRN.  - penicillin v potassium (VEETID) 500 MG tablet; Take 1 tablet (500 mg total) by mouth 3 (three) times daily.  Dispense: 30 tablet; Refill: 0 - predniSONE (DELTASONE) 20 MG tablet; 2 tablets daily for 3 days, 1 tablet daily for 4 days.  Dispense: 10 tablet; Refill: 0  Notify office for further evaluation and treatment, questions or concerns if s/s fail to improve. The risks and benefits of my recommendations, as well as other treatment options were discussed with the patient today. Questions were answered.  Further disposition pending results of labs. Discussed med's effects and SE's.    Over 15 minutes of exam, counseling, chart review, and critical decision making was performed.   Future Appointments  Date Time Provider [place]  05/07/2022  3:30 PM Alycia Rossetti, NP [facility] None  11/05/2022  9:00 AM Alycia Rossetti, NP [facility] None    ------------------------------------------------------------------------------------------------------------------   HPI BP 110/66   Pulse (!) 112   Temp (!) 97 F (36.1 C)   Ht 5' 3"$  (1.6 m)   Wt 181 lb 9.6 oz (82.4 kg)   SpO2 98%   BMI 32.17 kg/m   Patient complains of sore throat, HA, upper back ache, low grade fever. Onset of symptoms was approximately 1 week ago, and have been unchanged since that time.  She is drinking plenty of fluids. She has had recent close exposure to someone with proven streptococcal pharyngitis, flu, Covid as she is a Education officer, museum.  She has been taking IBU 800 mg for HA and pain but no relief.    Past Medical History:  Diagnosis Date   Allergy    Unspecified vitamin D deficiency      Allergies  Allergen Reactions   Lactose Intolerance (Gi) Other (See Comments)    unk    Current Outpatient Medications on File Prior to Visit  Medication Sig   acetaminophen (TYLENOL) 325 MG tablet Take 2 tablets (650 mg total) by mouth every 6 (six) hours as needed for mild pain (or temp > 100).   Cetirizine HCl 10 MG CAPS Take 10 mg by mouth daily.   citalopram (CELEXA) 20 MG tablet Take 1 tablet (20 mg total) by mouth daily. FOR MOOD   Cyanocobalamin (B-12 PO) Take 1 capsule by mouth daily.   drospirenone-ethinyl estradiol (SYEDA) 3-0.03 MG tablet Take 1 tablet Daily   Liraglutide -Weight Management (SAXENDA) 18 MG/3ML SOPN Inject 3 mg into the skin daily. Inject one pen SQ daily for weight loss   methocarbamol (ROBAXIN) 750 MG tablet Take 1 tablet (750 mg total) by mouth every 8 (eight) hours as needed for muscle spasms.   ondansetron (ZOFRAN) 4 MG tablet Take 1 tablet (4 mg total) by mouth every 6 (six) hours.   oxyCODONE (OXY IR/ROXICODONE) 5 MG immediate release tablet Take 1 tablet (5 mg total) by mouth every 6 (six)  hours as needed for moderate pain or severe pain.   penicillin v potassium (VEETID) 500 MG tablet Take 1 tablet (500 mg total) by mouth 3 (three) times daily.   phentermine (ADIPEX-P) 37.5 MG tablet Take 1/2 to 1 tablet every morning for dieting & weightloss   rosuvastatin (CRESTOR) 10 MG tablet Take 1 tablet (10 mg total) by mouth daily. (Patient taking differently: Take 5 mg by mouth daily.)   Vitamin D, Cholecalciferol, 25 MCG (1000 UT) TABS Take 25 mcg by mouth daily.   No current facility-administered medications on file prior to visit.    ROS: all  negative except what is noted in the HPI.   Physical Exam:  BP 110/66   Pulse (!) 112   Temp (!) 97 F (36.1 C)   Ht 5' 3"$  (1.6 m)   Wt 181 lb 9.6 oz (82.4 kg)   SpO2 98%   BMI 32.17 kg/m   General Appearance: NAD.  Awake, conversant and cooperative. Eyes: PERRLA, EOMs intact.  Sclera white.  Conjunctiva without erythema. Sinuses: No frontal/maxillary tenderness.  No nasal discharge. Nares patent.  ENT/Mouth: Ext aud canals clear.  Bilateral TMs w/DOL and without erythema or bulging. Hearing intact.  Posterior pharynx without swelling or exudate but moderately erythematous.  Neck: Supple.  No masses, nodules or thyromegaly. Respiratory: Effort is regular with non-labored breathing. Breath sounds are equal bilaterally without rales, rhonchi, wheezing or stridor.  Cardio: RRR with no MRGs. Brisk peripheral pulses without edema.  Abdomen: Active BS in all four quadrants.  Soft and non-tender without guarding, rebound tenderness, hernias or masses. Lymphatics: Non tender without lymphadenopathy.  Musculoskeletal: Full ROM, 5/5 strength, normal ambulation.  No clubbing or cyanosis. Skin: Appropriate color for ethnicity. Warm without rashes, lesions, ecchymosis, ulcers.  Neuro: CN II-XII grossly normal. Normal muscle tone without cerebellar symptoms and intact sensation.   Psych: AO X 3,  appropriate mood and affect, insight and judgment.     Darrol Jump, NP 11:52 AM [facility]

## 2022-04-27 NOTE — Patient Instructions (Signed)
Strep Throat, Adult Strep throat is an infection of the throat. It is caused by germs (bacteria). Strep throat is common during the cold months of the year. It mostly affects children who are 5-29 years old. However, people of all ages can get it at any time of the year. This infection spreads from person to person through coughing, sneezing, or having close contact. What are the causes? This condition is caused by the Streptococcus pyogenes germ. What increases the risk? You care for young children. Children are more likely to get strep throat and may spread it to others. You go to crowded places. Germs can spread easily in such places. You kiss or touch someone who has strep throat. What are the signs or symptoms? Fever or chills. Redness, swelling, or pain in the tonsils or throat. Pain or trouble when swallowing. White or yellow spots on the tonsils or throat. Tender glands in the neck and under the jaw. Bad breath. Red rash all over the body. This is rare. How is this treated? Medicines that kill germs (antibiotics). Medicines that treat pain or fever. These include: Ibuprofen or acetaminophen. Aspirin, only for people who are over the age of 18. Cough drops. Throat sprays. Follow these instructions at home: Medicines  Take over-the-counter and prescription medicines only as told by your doctor. Take your antibiotic medicine as told by your doctor. Do not stop taking the antibiotic even if you start to feel better. Eating and drinking  If you have trouble swallowing, eat soft foods until your throat feels better. Drink enough fluid to keep your pee (urine) pale yellow. To help with pain, you may have: Warm fluids, such as soup and tea. Cold fluids, such as frozen desserts or popsicles. General instructions Rinse your mouth (gargle) with a salt-water mixture 3-4 times a day or as needed. To make a salt-water mixture, dissolve -1 tsp (3-6 g) of salt in 1 cup (237 mL) of warm  water. Rest as much as you can. Stay home from work or school until you have been taking antibiotics for 24 hours. Do not smoke or use any products that contain nicotine or tobacco. If you need help quitting, ask your doctor. Keep all follow-up visits. How is this prevented?  Do not share food, drinking cups, or personal items. They can cause the germs to spread. Wash your hands well with soap and water. Make sure that all people in your house wash their hands well. Have family members tested if they have a fever or a sore throat. They may need an antibiotic if they have strep throat. Contact a doctor if: You have swelling in your neck that keeps getting bigger. You get a rash, cough, or earache. You cough up a thick fluid that is green, yellow-brown, or bloody. You have pain that does not get better with medicine. Your symptoms get worse instead of getting better. You have a fever. Get help right away if: You vomit. You have a very bad headache. Your neck hurts or feels stiff. You have chest pain or are short of breath. You have drooling, very bad throat pain, or changes in your voice. Your neck is swollen, or the skin gets red and tender. Your mouth is dry, or you are peeing less than normal. You keep feeling more tired or have trouble waking up. Your joints are red or painful. These symptoms may be an emergency. Do not wait to see if the symptoms will go away. Get help right away. Call   your local emergency services (911 in the [place] Summary Strep throat is an infection of the throat. It is caused by germs (bacteria). This infection can spread from person to person through coughing, sneezing, or having close contact. Take your medicines, including antibiotics, as told by your doctor. Do not stop taking the antibiotic even if you start to feel better. To prevent the spread of germs, wash your hands well with soap and water. Have others do the same. Do not share food, drinking cups,  or personal items. Get help right away if you have a bad headache, chest pain, shortness of breath, a stiff or painful neck, or you vomit. This information is not intended to replace advice given to you by your health care provider. Make sure you discuss any questions you have with your health care provider. Document Revised: 06/25/2020 Document Reviewed: 06/25/2020 Elsevier Patient Education  2023 Elsevier Inc.  

## 2022-05-07 ENCOUNTER — Ambulatory Visit: Payer: BC Managed Care – PPO | Admitting: Nurse Practitioner

## 2022-05-12 NOTE — Progress Notes (Addendum)
Assessment and Plan:  Beth Thomas was seen today for acute visit.  Diagnoses and all orders for this visit:  Class 1 obesity due to excess calories without serious comorbidity with body mass index (BMI) of 32.0 to 32.9 in adult Long discussion about weight loss, diet, and exercise Recommended diet heavy in fruits and veggies and low in animal meats, cheeses, and dairy products, appropriate calorie intake Patient will work on increasing protein and decrease processed /simple carbs, increase exercise  Continue Saxenda 2.4 mg SQ QD and Phentermine 37.5mg  daily Strongly encouraged to increase exercise - TSH  Medication management - TSH  Mixed hyperlipidemia Continue diet and exercise -     CBC with Differential/Platelet -     COMPLETE METABOLIC PANEL WITH GFR -     Lipid panel  Elevated LFT's Due to gallstones, had cholecystectomy 02/04/22, will recheck value today  - CMP      Further disposition pending results of labs. Discussed med's effects and SE's.   Over 30 minutes of exam, counseling, chart review, and critical decision making was performed.   Future Appointments  Date Time Provider [place]  11/05/2022  9:00 AM Alycia Rossetti, NP [facility] None    ------------------------------------------------------------------------------------------------------------------   HPI BP 118/72   Pulse 92   Temp (!) 97.5 F (36.4 C)   Ht 5\' 3"  (1.6 m)   Wt 184 lb 9.6 oz (83.7 kg)   LMP 04/22/2022   SpO2 97%   BMI 32.70 kg/m  27 y.o.female presents for discussion about weight loss.  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 She also works at [employer] on the weekends. 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   BMI is Body mass index is 32.7 kg/m., she has  been working on diet and exercise. She is currently on Saxenda 2.4mg  daily and Phentermine 37.5mg  QD.  She does have trouble getting the  medication due to supply shortage. She had lost 5 % of body weight but has been gaining weight back due to inability to obtain medications. She has been unable to lose weight on Phentermine alone, requires Saxenda.  She has been doing some exercise Wt Readings from Last 3 Encounters:  05/13/22 184 lb 9.6 oz (83.7 kg)  04/27/22 181 lb 9.6 oz (82.4 kg)  02/02/22 176 lb (79.8 kg)    BP well controlled without medication.  Denies headaches, chest pain , shortness of breath and dizziness.  BP Readings from Last 3 Encounters:  05/13/22 118/72  04/27/22 110/66  02/05/22 113/64   Pt had gallstones associated with intense pain 01/2022 and had cholecystectomy and then the next day had removal of stone from bile duct  Had elevated LFT's due to this  Is now doing well, no symptoms  Last lipid was not at goal and she is not currently on medication.  She is focusing on diet and exercise Lab Results  Component Value Date   CHOL 219 (H) 11/04/2021   HDL 61 11/04/2021   LDLCALC 132 (H) 11/04/2021   TRIG 148 11/04/2021   CHOLHDL 3.6 11/04/2021     Past Medical History:  Diagnosis Date   Allergy    Unspecified vitamin D deficiency      Allergies  Allergen Reactions   Lactose Intolerance (Gi) Other (See Comments)    unk    Current Outpatient Medications on File Prior to Visit  Medication Sig   acetaminophen (TYLENOL)  325 MG tablet Take 2 tablets (650 mg total) by mouth every 6 (six) hours as needed for mild pain (or temp > 100).   Cetirizine HCl 10 MG CAPS Take 10 mg by mouth daily.   citalopram (CELEXA) 20 MG tablet Take 1 tablet (20 mg total) by mouth daily. FOR MOOD   drospirenone-ethinyl estradiol (SYEDA) 3-0.03 MG tablet Take 1 tablet Daily   Liraglutide -Weight Management (SAXENDA) 18 MG/3ML SOPN Inject 3 mg into the skin daily. Inject one pen SQ daily for weight loss   phentermine (ADIPEX-P) 37.5 MG tablet Take 1/2 to 1 tablet every morning for dieting & weightloss   rosuvastatin  (CRESTOR) 10 MG tablet Take 1 tablet (10 mg total) by mouth daily. (Patient taking differently: Take 5 mg by mouth daily.)   Vitamin D, Cholecalciferol, 25 MCG (1000 UT) TABS Take 25 mcg by mouth daily.   Cyanocobalamin (B-12 PO) Take 1 capsule by mouth daily. (Patient not taking: Reported on 05/13/2022)   methocarbamol (ROBAXIN) 750 MG tablet Take 1 tablet (750 mg total) by mouth every 8 (eight) hours as needed for muscle spasms. (Patient not taking: Reported on 05/13/2022)   ondansetron (ZOFRAN) 4 MG tablet Take 1 tablet (4 mg total) by mouth every 6 (six) hours. (Patient not taking: Reported on 05/13/2022)   oxyCODONE (OXY IR/ROXICODONE) 5 MG immediate release tablet Take 1 tablet (5 mg total) by mouth every 6 (six) hours as needed for moderate pain or severe pain. (Patient not taking: Reported on 05/13/2022)   penicillin v potassium (VEETID) 500 MG tablet Take 1 tablet (500 mg total) by mouth 3 (three) times daily.   predniSONE (DELTASONE) 20 MG tablet 2 tablets daily for 3 days, 1 tablet daily for 4 days. (Patient not taking: Reported on 05/13/2022)   No current facility-administered medications on file prior to visit.    ROS: all negative except above.   Physical Exam:  BP 118/72   Pulse 92   Temp (!) 97.5 F (36.4 C)   Ht 5\' 3"  (1.6 m)   Wt 184 lb 9.6 oz (83.7 kg)   LMP 04/22/2022   SpO2 97%   BMI 32.70 kg/m   General Appearance: Well nourished, in no apparent distress. Eyes: PERRLA, EOMs, conjunctiva no swelling or erythema Sinuses: No Frontal/maxillary tenderness ENT/Mouth: Ext aud canals clear, TMs without erythema, bulging. No erythema, swelling, or exudate on post pharynx.   Hearing normal.  Neck: Supple, thyroid normal.  Respiratory: Respiratory effort normal, BS equal bilaterally without rales, rhonchi, wheezing or stridor.  Cardio: RRR with no MRGs. Brisk peripheral pulses without edema.  Abdomen: Soft, + BS.  Non tender, no guarding, rebound, hernias, masses. Lymphatics:  Non tender without lymphadenopathy.  Musculoskeletal: Full ROM, 5/5 strength, normal gait.  Skin: Warm, dry without rashes, lesions, ecchymosis. Laparoscopic incisions well healed Psych: Awake and oriented X 3, normal affect, Insight and Judgment appropriate.     Alycia Rossetti, NP 3:48 PM [facility]

## 2022-05-13 ENCOUNTER — Ambulatory Visit (INDEPENDENT_AMBULATORY_CARE_PROVIDER_SITE_OTHER): Payer: BC Managed Care – PPO | Admitting: Nurse Practitioner

## 2022-05-13 ENCOUNTER — Other Ambulatory Visit (HOSPITAL_COMMUNITY): Payer: Self-pay

## 2022-05-13 ENCOUNTER — Encounter: Payer: Self-pay | Admitting: Nurse Practitioner

## 2022-05-13 VITALS — BP 118/72 | HR 92 | Temp 97.5°F | Ht 63.0 in | Wt 184.6 lb

## 2022-05-13 DIAGNOSIS — Z6832 Body mass index (BMI) 32.0-32.9, adult: Secondary | ICD-10-CM

## 2022-05-13 DIAGNOSIS — E782 Mixed hyperlipidemia: Secondary | ICD-10-CM | POA: Diagnosis not present

## 2022-05-13 DIAGNOSIS — Z6834 Body mass index (BMI) 34.0-34.9, adult: Secondary | ICD-10-CM

## 2022-05-13 DIAGNOSIS — R7989 Other specified abnormal findings of blood chemistry: Secondary | ICD-10-CM | POA: Diagnosis not present

## 2022-05-13 DIAGNOSIS — Z79899 Other long term (current) drug therapy: Secondary | ICD-10-CM | POA: Diagnosis not present

## 2022-05-13 DIAGNOSIS — E6609 Other obesity due to excess calories: Secondary | ICD-10-CM | POA: Diagnosis not present

## 2022-05-13 MED ORDER — SAXENDA 18 MG/3ML ~~LOC~~ SOPN
3.0000 mg | PEN_INJECTOR | Freq: Every day | SUBCUTANEOUS | 3 refills | Status: DC
  Filled 2022-05-13: qty 15, 30d supply, fill #0

## 2022-05-13 NOTE — Patient Instructions (Signed)

## 2022-05-14 LAB — CBC WITH DIFFERENTIAL/PLATELET
Absolute Monocytes: 663 cells/uL (ref 200–950)
Basophils Absolute: 50 cells/uL (ref 0–200)
Basophils Relative: 0.5 %
Eosinophils Absolute: 139 cells/uL (ref 15–500)
Eosinophils Relative: 1.4 %
HCT: 35.5 % (ref 35.0–45.0)
Hemoglobin: 11.9 g/dL (ref 11.7–15.5)
Lymphs Abs: 3732 cells/uL (ref 850–3900)
MCH: 29 pg (ref 27.0–33.0)
MCHC: 33.5 g/dL (ref 32.0–36.0)
MCV: 86.6 fL (ref 80.0–100.0)
MPV: 12 fL (ref 7.5–12.5)
Monocytes Relative: 6.7 %
Neutro Abs: 5316 cells/uL (ref 1500–7800)
Neutrophils Relative %: 53.7 %
Platelets: 260 10*3/uL (ref 140–400)
RBC: 4.1 10*6/uL (ref 3.80–5.10)
RDW: 12.9 % (ref 11.0–15.0)
Total Lymphocyte: 37.7 %
WBC: 9.9 10*3/uL (ref 3.8–10.8)

## 2022-05-14 LAB — LIPID PANEL
Cholesterol: 202 mg/dL — ABNORMAL HIGH (ref <200)
HDL: 56 mg/dL (ref >=50)
LDL Cholesterol (Calc): 117 mg/dL (calc) — ABNORMAL HIGH
Non-HDL Cholesterol (Calc): 146 mg/dL (calc) — ABNORMAL HIGH (ref <130)
Total CHOL/HDL Ratio: 3.6 (calc) (ref <5.0)
Triglycerides: 173 mg/dL — ABNORMAL HIGH (ref <150)

## 2022-05-14 LAB — COMPLETE METABOLIC PANEL WITH GFR
AG Ratio: 1.4 (calc) (ref 1.0–2.5)
ALT: 10 U/L (ref 6–29)
AST: 13 U/L (ref 10–30)
Albumin: 4.2 g/dL (ref 3.6–5.1)
Alkaline phosphatase (APISO): 41 U/L (ref 31–125)
BUN: 15 mg/dL (ref 7–25)
CO2: 27 mmol/L (ref 20–32)
Calcium: 9.6 mg/dL (ref 8.6–10.2)
Chloride: 102 mmol/L (ref 98–110)
Creat: 0.72 mg/dL (ref 0.50–0.96)
Globulin: 3.1 g/dL (calc) (ref 1.9–3.7)
Glucose, Bld: 76 mg/dL (ref 65–99)
Potassium: 4.1 mmol/L (ref 3.5–5.3)
Sodium: 137 mmol/L (ref 135–146)
Total Bilirubin: 0.3 mg/dL (ref 0.2–1.2)
Total Protein: 7.3 g/dL (ref 6.1–8.1)
eGFR: 117 mL/min/{1.73_m2} (ref >=60)

## 2022-05-14 LAB — TSH: TSH: 1.97 mIU/L

## 2022-05-20 ENCOUNTER — Other Ambulatory Visit (HOSPITAL_COMMUNITY): Payer: Self-pay

## 2022-05-22 ENCOUNTER — Other Ambulatory Visit (HOSPITAL_COMMUNITY): Payer: Self-pay

## 2022-05-22 ENCOUNTER — Encounter: Payer: Self-pay | Admitting: Nurse Practitioner

## 2022-05-25 NOTE — Telephone Encounter (Signed)
[place] response

## 2022-06-08 ENCOUNTER — Telehealth: Payer: Self-pay

## 2022-06-08 NOTE — Telephone Encounter (Signed)
Patient aware that Beth Thomas has been denied. BCBS state is not going to be covering any weight loss medications after April 1st of this year. She is going to call the pharmacy to see how much Saxenda and Mancel Parsons are out of pocket and let us know if she wants [place] to send it in.

## 2022-06-12 ENCOUNTER — Other Ambulatory Visit (HOSPITAL_COMMUNITY): Payer: Self-pay

## 2022-06-12 ENCOUNTER — Other Ambulatory Visit: Payer: Self-pay | Admitting: Nurse Practitioner

## 2022-06-12 DIAGNOSIS — F419 Anxiety disorder, unspecified: Secondary | ICD-10-CM

## 2022-08-01 ENCOUNTER — Other Ambulatory Visit: Payer: Self-pay | Admitting: Nurse Practitioner

## 2022-08-01 DIAGNOSIS — E6609 Other obesity due to excess calories: Secondary | ICD-10-CM

## 2022-08-03 ENCOUNTER — Telehealth: Payer: Self-pay

## 2022-08-03 NOTE — Telephone Encounter (Signed)
Prior auth for phentermine completed and submitted.

## 2022-08-06 NOTE — Telephone Encounter (Addendum)
Phentermine approved through 11/03/22.

## 2022-09-05 ENCOUNTER — Other Ambulatory Visit: Payer: Self-pay | Admitting: Nurse Practitioner

## 2022-09-05 DIAGNOSIS — E782 Mixed hyperlipidemia: Secondary | ICD-10-CM

## 2022-09-20 ENCOUNTER — Other Ambulatory Visit: Payer: Self-pay | Admitting: Nurse Practitioner

## 2022-09-20 DIAGNOSIS — E6609 Other obesity due to excess calories: Secondary | ICD-10-CM

## 2022-09-21 ENCOUNTER — Other Ambulatory Visit: Payer: Self-pay

## 2022-09-21 ENCOUNTER — Other Ambulatory Visit: Payer: Self-pay | Admitting: Nurse Practitioner

## 2022-09-21 DIAGNOSIS — J069 Acute upper respiratory infection, unspecified: Secondary | ICD-10-CM

## 2022-09-21 DIAGNOSIS — J302 Other seasonal allergic rhinitis: Secondary | ICD-10-CM

## 2022-09-21 MED ORDER — FLUTICASONE PROPIONATE 50 MCG/ACT NA SUSP: 2.0000 | Freq: Every day | NASAL | 1 refills | Status: DC

## 2022-10-28 ENCOUNTER — Other Ambulatory Visit: Payer: Self-pay | Admitting: Nurse Practitioner

## 2022-10-28 DIAGNOSIS — F419 Anxiety disorder, unspecified: Secondary | ICD-10-CM

## 2022-11-05 ENCOUNTER — Encounter: Payer: Self-pay | Admitting: Nurse Practitioner

## 2022-11-05 ENCOUNTER — Ambulatory Visit (INDEPENDENT_AMBULATORY_CARE_PROVIDER_SITE_OTHER): Payer: BC Managed Care – PPO | Admitting: Nurse Practitioner

## 2022-11-05 VITALS — BP 114/68 | HR 100 | Temp 97.5°F | Ht 63.0 in | Wt 165.2 lb

## 2022-11-05 DIAGNOSIS — Z131 Encounter for screening for diabetes mellitus: Secondary | ICD-10-CM

## 2022-11-05 DIAGNOSIS — E612 Magnesium deficiency: Secondary | ICD-10-CM

## 2022-11-05 DIAGNOSIS — E559 Vitamin D deficiency, unspecified: Secondary | ICD-10-CM | POA: Diagnosis not present

## 2022-11-05 DIAGNOSIS — R7309 Other abnormal glucose: Secondary | ICD-10-CM

## 2022-11-05 DIAGNOSIS — Z0001 Encounter for general adult medical examination with abnormal findings: Secondary | ICD-10-CM

## 2022-11-05 DIAGNOSIS — Z1389 Encounter for screening for other disorder: Secondary | ICD-10-CM

## 2022-11-05 DIAGNOSIS — G43009 Migraine without aura, not intractable, without status migrainosus: Secondary | ICD-10-CM

## 2022-11-05 DIAGNOSIS — Z13220 Encounter for screening for lipoid disorders: Secondary | ICD-10-CM | POA: Diagnosis not present

## 2022-11-05 DIAGNOSIS — Z0000 Encounter for general adult medical examination without abnormal findings: Secondary | ICD-10-CM | POA: Diagnosis not present

## 2022-11-05 DIAGNOSIS — E782 Mixed hyperlipidemia: Secondary | ICD-10-CM

## 2022-11-05 DIAGNOSIS — R7989 Other specified abnormal findings of blood chemistry: Secondary | ICD-10-CM

## 2022-11-05 DIAGNOSIS — Z79899 Other long term (current) drug therapy: Secondary | ICD-10-CM

## 2022-11-05 DIAGNOSIS — E6609 Other obesity due to excess calories: Secondary | ICD-10-CM

## 2022-11-05 DIAGNOSIS — J302 Other seasonal allergic rhinitis: Secondary | ICD-10-CM

## 2022-11-05 DIAGNOSIS — F419 Anxiety disorder, unspecified: Secondary | ICD-10-CM

## 2022-11-05 DIAGNOSIS — Z1329 Encounter for screening for other suspected endocrine disorder: Secondary | ICD-10-CM

## 2022-11-05 NOTE — Progress Notes (Signed)
Complete Physical  Assessment and Plan:  Beth Thomas was seen today for annual exam.  Diagnoses and all orders for this visit:  Encounter for general adult medical examination with abnormal findings -     CBC with Differential/Platelet -     COMPLETE METABOLIC PANEL WITH GFR -     Lipid panel -     TSH -     Hemoglobin A1c -     VITAMIN D 25 Hydroxy (Vit-D Deficiency, Fractures) -     Magnesium -     Urinalysis, Routine w reflex microscopic   Mixed hyperlipidemia Continue Rosuvastatin,diet and exercise -     COMPLETE METABOLIC PANEL WITH GFR -     Lipid panel  Abnormal glucose Continue diet and exercise -     Hemoglobin A1c  Anxiety  Continue Celxa and monitor symptoms  Vitamin D deficiency -     VITAMIN D 25 Hydroxy (Vit-D Deficiency, Fractures)  Medication management -     CBC with Differential/Platelet -     COMPLETE METABOLIC PANEL WITH GFR -     Lipid panel -     TSH -     Hemoglobin A1c -     VITAMIN D 25 Hydroxy (Vit-D Deficiency, Fractures) -     Magnesium -     Urinalysis, Routine w reflex microscopic   Overweight BMI 29  Continue medication, diet and exercise Over 10 mintues spent counseling patient on diet and exercise Continue Phentermine 37.5 mg every day .   Marland Kitchen  Migraine without aura and without status migrainosus, not intractable  No longer using Imitrex, migraines are fewer and controlled with Advil  Allergic rhinitis, unspecified seasonality, unspecified trigger  Continue Claritin daily and Flonase as needed  Screening for thyroid disorder -     TSH  Screening for hematuria or proteinuria -     Urinalysis, Routine w reflex microscopic -     Microalbumin / creatinine urine ratio       Discussed med's effects and SE's. Screening labs and tests as requested with regular follow-up as recommended. Over 40 minutes of exam, counseling, chart review and critical decision making was performed  HPI  This very nice 27 y.o.female presents for  complete physical.   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 Also works part time at [employer].  She has been having less migraines and has not had to use Imitrex controlled with Advil. Can usually control with more protein and water.   Her BP is currently well controlled without medication.  BP Readings from Last 3 Encounters:  11/05/22 114/68  05/13/22 118/72  04/27/22 110/66  She does workout, she is trying to do 5 days a week.    She is sexually active, with BCP she has been having very regular menses. LMP 10/21/21- PAP 09/30/20- negative  Finally, patient has history of Vitamin D Deficiency, not on medication consistently.  Lab Results  Component Value Date   VD25OH 37 11/04/2021    BMI is Body mass index is 29.26 kg/m., she is working on diet and exercise. She is trying to be more active  Drinking only zero calorie sodas and only 1 a day.  She is cutting back on sweets. She is currently on Phentermine 37.5 mg every day. She is down 19 pounds in the past 6 months Wt Readings from Last 3 Encounters:  11/05/22 165 lb 3.2 oz (74.9 kg)  05/13/22 184 lb  9.6 oz (83.7 kg)  04/27/22 181 lb 9.6 oz (82.4 kg)   She is currently on Rosuvastatin 5 mg every day and denies myalgias Lab Results  Component Value Date   CHOL 202 (H) 05/13/2022   HDL 56 05/13/2022   LDLCALC 117 (H) 05/13/2022   TRIG 173 (H) 05/13/2022   CHOLHDL 3.6 05/13/2022   Last J4N in the office was:  Lab Results  Component Value Date   HGBA1C 5.1 11/04/2021      Current Medications:  Current Outpatient Medications on File Prior to Visit  Medication Sig Dispense Refill   Cetirizine HCl 10 MG CAPS Take 10 mg by mouth daily.     citalopram (CELEXA) 20 MG tablet TAKE 1 TABLET(20 MG) BY MOUTH DAILY FOR MOOD 90 tablet 0   drospirenone-ethinyl estradiol (SYEDA) 3-0.03 MG tablet Take 1 tablet Daily 84 tablet 3   fluticasone (FLONASE) 50 MCG/ACT  nasal spray SHAKE LIQUID AND USE 2 SPRAYS IN EACH NOSTRIL AT BEDTIME 48 g 0   phentermine (ADIPEX-P) 37.5 MG tablet TAKE ONE-HALF TO ONE TABLET BY MOUTH EVERY MORNING FOR DIETING AND WEIGHTLOSS. 30 tablet 0   rosuvastatin (CRESTOR) 5 MG tablet TAKE 1 TABLET(5 MG) BY MOUTH DAILY 90 tablet 3   Vitamin D, Cholecalciferol, 25 MCG (1000 UT) TABS Take 25 mcg by mouth daily.     acetaminophen (TYLENOL) 325 MG tablet Take 2 tablets (650 mg total) by mouth every 6 (six) hours as needed for mild pain (or temp > 100).     Liraglutide -Weight Management (SAXENDA) 18 MG/3ML SOPN Inject 3 mg into the skin daily for weight loss (Patient not taking: Reported on 11/05/2022) 15 mL 3   penicillin v potassium (VEETID) 500 MG tablet Take 1 tablet (500 mg total) by mouth 3 (three) times daily. 30 tablet 0   No current facility-administered medications on file prior to visit.   Health Maintenance:   Immunization History  Administered Date(s) Administered   HPV 9-valent 10/10/2014, 12/12/2014, 05/14/2015   Influenza Inj Mdck Quad With Preservative 11/17/2016   Influenza-Unspecified 11/17/2016   PPD Test 09/12/2018   Tdap 11/15/2006, 06/04/2016   Health Maintenance  Topic Date Due   COVID-19 Vaccine (1) Never done   Hepatitis C Screening  Never done   INFLUENZA VACCINE  10/15/2022   PAP-Cervical Cytology Screening  10/01/2023   PAP SMEAR-Modifier  10/01/2023   DTaP/Tdap/Td (3 - Td or Tdap) 06/05/2026   HPV VACCINES  Completed   HIV Screening  Completed     Medical History:  Past Medical History:  Diagnosis Date   Allergy    Unspecified vitamin D deficiency    Allergies Allergies  Allergen Reactions   Lactose Intolerance (Gi) Other (See Comments)    unk    SURGICAL HISTORY She  has a past surgical history that includes Tonsilectomy/adenoidectomy with myringotomy (01/2016); Cholecystectomy (N/A, 02/03/2022); Endoscopic retrograde cholangiopancreatography (ercp) with propofol (N/A, 02/04/2022);  sphincterotomy (02/04/2022); and removal of stones (02/04/2022). FAMILY HISTORY Her family history includes Diabetes in her mother; Hyperlipidemia in her father and mother; Hypertension in her father and mother. SOCIAL HISTORY She  reports that she has never smoked. She has been exposed to tobacco smoke. She has never used smokeless tobacco. She reports that she does not drink alcohol and does not use drugs. 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  Negative for chills, fever, malaise/fatigue and weight loss.  HENT: Negative.  Negative for congestion, ear discharge, hearing loss, sore throat and tinnitus.   Eyes: Negative.  Negative for blurred vision, discharge and redness.  Respiratory: Negative.  Negative for cough, shortness of breath and wheezing.   Cardiovascular: Negative.  Negative for chest pain, palpitations, orthopnea and leg swelling.  Gastrointestinal: Negative.  Negative for abdominal pain, blood in stool, constipation, diarrhea, heartburn, nausea and vomiting.  Genitourinary: Negative.  Negative for dysuria, frequency and urgency.  Musculoskeletal: Negative.  Negative for back pain, falls, joint pain and myalgias.  Skin:  Negative for itching and rash.  Neurological:  Positive for headaches (rare migraines). Negative for dizziness, tingling, seizures and weakness.  Endo/Heme/Allergies: Negative.  Does not bruise/bleed easily.  Psychiatric/Behavioral: Negative.  Negative for depression, hallucinations, memory loss and suicidal ideas. The patient does not have insomnia.     Physical Exam: Estimated body mass index is 29.26 kg/m as calculated from the following:   Height as of this encounter: 5\' 3"  (1.6 m).   Weight as of this encounter: 165 lb 3.2 oz (74.9 kg). BP 114/68   Pulse 100   Temp (!) 97.5 F (36.4 C)    Ht 5\' 3"  (1.6 m)   Wt 165 lb 3.2 oz (74.9 kg)   LMP 10/28/2022   SpO2 98%   BMI 29.26 kg/m  General Appearance: Well nourished, in no apparent distress.  Eyes: PERRLA, EOMs, conjunctiva no swelling or erythema, normal fundi and vessels. Right outer eye with erythematous scaly area with bumps.   Sinuses: No Frontal/maxillary tenderness  ENT/Mouth: Ext aud canals clear, normal light reflex with TMs without erythema, bulging. Good dentition. No erythema, swelling, or exudate on post pharynx. Tonsils not swollen or erythematous. Hearing normal.  Neck: Supple, thyroid normal. No bruits  Respiratory: Respiratory effort normal, BS equal bilaterally without rales, rhonchi, wheezing or stridor.  Cardio: RRR without murmurs, rubs or gallops. Brisk peripheral pulses without edema.  Chest: symmetric, with normal excursions and percussion.  Breasts: Deferred, no concerns Abdomen: Soft, nontender, no guarding, rebound, hernias, masses, or organomegaly.  Lymphatics: Non tender without lymphadenopathy.  Genitourinary: Deferred Musculoskeletal: Full ROM all peripheral extremities,5/5 strength, and normal gait.  Skin: Warm, dry without rashes, lesions, ecchymosis. Neuro: Cranial nerves intact, reflexes equal bilaterally. Normal muscle tone, no cerebellar symptoms. Sensation intact.  Psych: Awake and oriented X 3, normal affect, Insight and Judgment appropriate.   EKG: defer  Elenora Hawbaker E  9:18 AM [facility]

## 2022-11-05 NOTE — Patient Instructions (Signed)

## 2022-11-06 LAB — COMPLETE METABOLIC PANEL WITH GFR
AG Ratio: 1.4 (calc) (ref 1.0–2.5)
ALT: 13 U/L (ref 6–29)
AST: 17 U/L (ref 10–30)
Albumin: 4.6 g/dL (ref 3.6–5.1)
Alkaline phosphatase (APISO): 44 U/L (ref 31–125)
BUN: 10 mg/dL (ref 7–25)
CO2: 26 mmol/L (ref 20–32)
Calcium: 9.4 mg/dL (ref 8.6–10.2)
Chloride: 100 mmol/L (ref 98–110)
Creat: 0.76 mg/dL (ref 0.50–0.96)
Globulin: 3.4 g/dL (ref 1.9–3.7)
Glucose, Bld: 78 mg/dL (ref 65–99)
Potassium: 4.4 mmol/L (ref 3.5–5.3)
Sodium: 137 mmol/L (ref 135–146)
Total Bilirubin: 0.3 mg/dL (ref 0.2–1.2)
Total Protein: 8 g/dL (ref 6.1–8.1)
eGFR: 109 mL/min/{1.73_m2} (ref >=60)

## 2022-11-06 LAB — CBC WITH DIFFERENTIAL/PLATELET
Absolute Monocytes: 481 {cells}/uL (ref 200–950)
Basophils Absolute: 52 cells/uL (ref 0–200)
Basophils Relative: 0.8 %
Eosinophils Absolute: 150 {cells}/uL (ref 15–500)
Eosinophils Relative: 2.3 %
HCT: 40 % (ref 35.0–45.0)
Hemoglobin: 13.5 g/dL (ref 11.7–15.5)
Lymphs Abs: 2659 {cells}/uL (ref 850–3900)
MCH: 29.4 pg (ref 27.0–33.0)
MCHC: 33.8 g/dL (ref 32.0–36.0)
MCV: 87.1 fL (ref 80.0–100.0)
MPV: 11.7 fL (ref 7.5–12.5)
Monocytes Relative: 7.4 %
Neutro Abs: 3159 {cells}/uL (ref 1500–7800)
Neutrophils Relative %: 48.6 %
Platelets: 271 10*3/uL (ref 140–400)
RBC: 4.59 10*6/uL (ref 3.80–5.10)
RDW: 13.2 % (ref 11.0–15.0)
Total Lymphocyte: 40.9 %
WBC: 6.5 10*3/uL (ref 3.8–10.8)

## 2022-11-06 LAB — MAGNESIUM: Magnesium: 2.5 mg/dL (ref 1.5–2.5)

## 2022-11-06 LAB — URINALYSIS, ROUTINE W REFLEX MICROSCOPIC
Bacteria, UA: NONE SEEN /HPF
Bilirubin Urine: NEGATIVE
Glucose, UA: NEGATIVE
Hgb urine dipstick: NEGATIVE
Hyaline Cast: NONE SEEN /LPF
Nitrite: NEGATIVE
Protein, ur: NEGATIVE
RBC / HPF: NONE SEEN /HPF (ref 0–2)
Specific Gravity, Urine: 1.023 (ref 1.001–1.035)
WBC, UA: NONE SEEN /HPF (ref 0–5)
pH: 6 (ref 5.0–8.0)

## 2022-11-06 LAB — VITAMIN D 25 HYDROXY (VIT D DEFICIENCY, FRACTURES): Vit D, 25-Hydroxy: 40 ng/mL (ref 30–100)

## 2022-11-06 LAB — LIPID PANEL
Cholesterol: 168 mg/dL (ref <200)
HDL: 54 mg/dL (ref >=50)
LDL Cholesterol (Calc): 84 mg/dL
Non-HDL Cholesterol (Calc): 114 mg/dL (ref <130)
Total CHOL/HDL Ratio: 3.1 (calc) (ref <5.0)
Triglycerides: 206 mg/dL — ABNORMAL HIGH (ref <150)

## 2022-11-06 LAB — HEMOGLOBIN A1C W/OUT EAG: Hgb A1c MFr Bld: 5.2 %{Hb} (ref <5.7)

## 2022-11-06 LAB — MICROSCOPIC MESSAGE

## 2022-11-06 LAB — TSH: TSH: 2.46 m[IU]/L

## 2022-12-14 ENCOUNTER — Other Ambulatory Visit: Payer: Self-pay | Admitting: Nurse Practitioner

## 2022-12-14 DIAGNOSIS — E66811 Obesity, class 1: Secondary | ICD-10-CM

## 2022-12-15 ENCOUNTER — Telehealth: Payer: Self-pay

## 2022-12-15 NOTE — Telephone Encounter (Signed)
Prior auth denied. 

## 2022-12-15 NOTE — Telephone Encounter (Signed)
Prior auth completed and submitted. 

## 2022-12-19 ENCOUNTER — Other Ambulatory Visit: Payer: Self-pay | Admitting: Nurse Practitioner

## 2022-12-19 DIAGNOSIS — J302 Other seasonal allergic rhinitis: Secondary | ICD-10-CM

## 2023-01-05 ENCOUNTER — Other Ambulatory Visit: Payer: Self-pay | Admitting: Nurse Practitioner

## 2023-01-05 DIAGNOSIS — Z30011 Encounter for initial prescription of contraceptive pills: Secondary | ICD-10-CM

## 2023-01-08 ENCOUNTER — Encounter: Payer: Self-pay | Admitting: Nurse Practitioner

## 2023-01-08 ENCOUNTER — Ambulatory Visit (INDEPENDENT_AMBULATORY_CARE_PROVIDER_SITE_OTHER): Payer: BC Managed Care – PPO | Admitting: Nurse Practitioner

## 2023-01-08 VITALS — BP 108/76 | HR 98 | Temp 97.8°F | Ht 63.0 in | Wt 167.0 lb

## 2023-01-08 DIAGNOSIS — E663 Overweight: Secondary | ICD-10-CM

## 2023-01-08 DIAGNOSIS — Z1152 Encounter for screening for COVID-19: Secondary | ICD-10-CM | POA: Diagnosis not present

## 2023-01-08 DIAGNOSIS — J069 Acute upper respiratory infection, unspecified: Secondary | ICD-10-CM | POA: Diagnosis not present

## 2023-01-08 LAB — POC COVID19 BINAXNOW: SARS Coronavirus 2 Ag: NEGATIVE

## 2023-01-08 MED ORDER — AZITHROMYCIN 250 MG PO TABS: ORAL_TABLET | ORAL | 1 refills | Status: AC

## 2023-01-08 MED ORDER — PROMETHAZINE-DM 6.25-15 MG/5ML PO SYRP: 5.0000 mL | ORAL_SOLUTION | Freq: Four times a day (QID) | ORAL | 1 refills | Status: AC | PRN

## 2023-01-08 NOTE — Progress Notes (Signed)
Assessment and Plan:  Beth Thomas was seen today for acute visit.  Diagnoses and all orders for this visit:  Encounter for screening for COVID-19 -     POC COVID-19- negative- negative  Acute URI Push fluids Encouraged Vit D, Vit C and Zinc to boost immune system Use Mucinex to thin secretions Use Zithromax and Promethazine DM as directed If no improvement by Monday notify the office and will order a steroid course -     azithromycin (ZITHROMAX) 250 MG tablet; Take 2 tablets (500 mg) on  Day 1,  followed by 1 tablet (250 mg) once daily on Days 2 through 5. -     promethazine-dextromethorphan (PROMETHAZINE-DM) 6.25-15 MG/5ML syrup; Take 5 mLs by mouth 4 (four) times daily as needed for cough.  Overweight Continue decreasing saturated fats and simple carbs Increase lean protein and exercise Continue Phentermine      Further disposition pending results of labs. Discussed med's effects and SE's.   Over 30 minutes of exam, counseling, chart review, and critical decision making was performed.   Future Appointments  Date Time Provider Department Center  05/11/2023  4:00 PM Raynelle Dick, NP [facility] None  11/05/2023  9:00 AM Raynelle Dick, NP [facility] None    ------------------------------------------------------------------------------------------------------------------   HPI BP 108/76   Pulse 98   Temp 97.8 F (36.6 C)   Ht 5\' 3"  (1.6 m)   Wt 167 lb (75.8 kg)   SpO2 98%   BMI 29.58 kg/m   27 y.o.female presents for headache, sore throat and body aches x 3 days.  Productive cough of green mucus.  Nasal drainage of green mucus. Has been using Advil  Denies fevers, nausea, vomiting and diarrhea.   BP well controlled without medication BP Readings from Last 3 Encounters:  01/08/23 108/76  11/05/22 114/68  05/13/22 118/72  Denies headaches, chest pain, shortness of breath and dizziness   BMI is Body mass index is 29.58 kg/m., she has been working on diet and  exercise. Continues on Phentermine 37.5 mg daily and denies side effects.  Wt Readings from Last 3 Encounters:  01/08/23 167 lb (75.8 kg)  11/05/22 165 lb 3.2 oz (74.9 kg)  05/13/22 184 lb 9.6 oz (83.7 kg)      Past Medical History:  Diagnosis Date   Allergy    Unspecified vitamin D deficiency      Allergies  Allergen Reactions   Lactose Intolerance (Gi) Other (See Comments)    unk    Current Outpatient Medications on File Prior to Visit  Medication Sig   Cetirizine HCl 10 MG CAPS Take 10 mg by mouth daily.   citalopram (CELEXA) 20 MG tablet TAKE 1 TABLET(20 MG) BY MOUTH DAILY FOR MOOD   fluticasone (FLONASE) 50 MCG/ACT nasal spray SHAKE LIQUID AND USE 2 SPRAYS IN EACH NOSTRIL AT BEDTIME   phentermine (ADIPEX-P) 37.5 MG tablet TAKE ONE-HALF TO ONE TABLET BY MOUTH EVERY MORNING FOR DIETING AND WEIGHTLOSS   rosuvastatin (CRESTOR) 5 MG tablet TAKE 1 TABLET(5 MG) BY MOUTH DAILY   SYEDA 3-0.03 MG tablet TAKE 1 TABLET BY MOUTH DAILY.Marland Kitchen   Vitamin D, Cholecalciferol, 25 MCG (1000 UT) TABS Take 25 mcg by mouth daily.   No current facility-administered medications on file prior to visit.    ROS: all negative except above.   Physical Exam:  BP 108/76   Pulse 98   Temp 97.8 F (36.6 C)   Ht 5\' 3"  (1.6 m)   Wt 167 lb (75.8 kg)  SpO2 98%   BMI 29.58 kg/m   General Appearance: Well nourished, in no apparent distress. Eyes: PERRLA, EOMs, conjunctiva no swelling or erythema Sinuses: No Frontal/maxillary tenderness ENT/Mouth: Ext aud canals clear, TMs without erythema, bulging. No erythema, swelling, or exudate on post pharynx.  Hearing normal. Nares erythematous Neck: Supple, thyroid normal.  Respiratory: Respiratory effort normal, BS equal bilaterally without rales, rhonchi, wheezing or stridor.  Cardio: RRR with no MRGs. Brisk peripheral pulses without edema.  Abdomen: Soft, + BS.  Non tender, no guarding, rebound, hernias, masses. Lymphatics: Tender submandibular adenopathy  bilaterally Musculoskeletal: Full ROM, 5/5 strength, normal gait.  Skin: Warm, dry without rashes, lesions, ecchymosis.  Neuro: Cranial nerves intact. Normal muscle tone, no cerebellar symptoms. Sensation intact.  Psych: Awake and oriented X 3, normal affect, Insight and Judgment appropriate.     Raynelle Dick, NP 9:52 AM [facility]

## 2023-01-08 NOTE — Patient Instructions (Signed)

## 2023-02-03 ENCOUNTER — Other Ambulatory Visit: Payer: Self-pay | Admitting: Nurse Practitioner

## 2023-02-03 DIAGNOSIS — F419 Anxiety disorder, unspecified: Secondary | ICD-10-CM

## 2023-04-12 ENCOUNTER — Ambulatory Visit: Payer: Self-pay | Admitting: Nurse Practitioner

## 2023-05-11 ENCOUNTER — Ambulatory Visit: Payer: Self-pay | Admitting: Nurse Practitioner

## 2023-11-05 ENCOUNTER — Encounter: Payer: Self-pay | Admitting: Nurse Practitioner

## 2024-02-07 ENCOUNTER — Other Ambulatory Visit: Payer: Self-pay | Admitting: Nurse Practitioner

## 2024-02-07 DIAGNOSIS — Z30011 Encounter for initial prescription of contraceptive pills: Secondary | ICD-10-CM

## 2024-02-13 ENCOUNTER — Other Ambulatory Visit: Payer: Self-pay | Admitting: Nurse Practitioner

## 2024-02-13 DIAGNOSIS — F419 Anxiety disorder, unspecified: Secondary | ICD-10-CM
